# Patient Record
Sex: Female | Born: 1965 | Race: White | Hispanic: No | Marital: Married | Smoking: Never smoker
Health system: Southern US, Community
[De-identification: ages and names within clinical notes are randomized; demographics above are authoritative.]

## PROBLEM LIST (undated history)

## (undated) DIAGNOSIS — I10 Essential (primary) hypertension: Secondary | ICD-10-CM

## (undated) DIAGNOSIS — Z5189 Encounter for other specified aftercare: Secondary | ICD-10-CM

## (undated) DIAGNOSIS — E785 Hyperlipidemia, unspecified: Secondary | ICD-10-CM

## (undated) DIAGNOSIS — Z9889 Other specified postprocedural states: Secondary | ICD-10-CM

## (undated) DIAGNOSIS — Z9861 Coronary angioplasty status: Secondary | ICD-10-CM

## (undated) DIAGNOSIS — I238 Other current complications following acute myocardial infarction: Secondary | ICD-10-CM

## (undated) DIAGNOSIS — K635 Polyp of colon: Secondary | ICD-10-CM

## (undated) DIAGNOSIS — R112 Nausea with vomiting, unspecified: Secondary | ICD-10-CM

## (undated) DIAGNOSIS — I2510 Atherosclerotic heart disease of native coronary artery without angina pectoris: Secondary | ICD-10-CM

## (undated) DIAGNOSIS — R748 Abnormal levels of other serum enzymes: Secondary | ICD-10-CM

## (undated) DIAGNOSIS — K760 Fatty (change of) liver, not elsewhere classified: Secondary | ICD-10-CM

## (undated) DIAGNOSIS — K219 Gastro-esophageal reflux disease without esophagitis: Secondary | ICD-10-CM

## (undated) DIAGNOSIS — I214 Non-ST elevation (NSTEMI) myocardial infarction: Secondary | ICD-10-CM

## (undated) DIAGNOSIS — T7840XA Allergy, unspecified, initial encounter: Secondary | ICD-10-CM

## (undated) DIAGNOSIS — I7090 Unspecified atherosclerosis: Secondary | ICD-10-CM

## (undated) DIAGNOSIS — I319 Disease of pericardium, unspecified: Secondary | ICD-10-CM

## (undated) HISTORY — DX: Other current complications following acute myocardial infarction: I23.8

## (undated) HISTORY — DX: Allergy, unspecified, initial encounter: T78.40XA

## (undated) HISTORY — DX: Encounter for other specified aftercare: Z51.89

## (undated) HISTORY — DX: Abnormal levels of other serum enzymes: R74.8

## (undated) HISTORY — DX: Gastro-esophageal reflux disease without esophagitis: K21.9

## (undated) HISTORY — DX: Polyp of colon: K63.5

## (undated) HISTORY — PX: CARDIAC CATHETERIZATION: SHX172

## (undated) HISTORY — DX: Disease of pericardium, unspecified: I31.9

## (undated) HISTORY — PX: TUBAL LIGATION: SHX77

## (undated) HISTORY — DX: Unspecified atherosclerosis: I70.90

## (undated) HISTORY — DX: Essential (primary) hypertension: I10

## (undated) HISTORY — DX: Fatty (change of) liver, not elsewhere classified: K76.0

---

## 1999-04-14 ENCOUNTER — Inpatient Hospital Stay (HOSPITAL_COMMUNITY): Admission: AD | Admit: 1999-04-14 | Discharge: 1999-04-17 | Payer: Self-pay | Admitting: Obstetrics and Gynecology

## 1999-04-20 ENCOUNTER — Encounter (HOSPITAL_COMMUNITY): Admission: RE | Admit: 1999-04-20 | Discharge: 1999-07-19 | Payer: Self-pay | Admitting: Obstetrics and Gynecology

## 2000-06-26 ENCOUNTER — Encounter: Payer: Self-pay | Admitting: Physical Medicine & Rehabilitation

## 2000-06-26 ENCOUNTER — Ambulatory Visit (HOSPITAL_COMMUNITY)
Admission: RE | Admit: 2000-06-26 | Discharge: 2000-06-26 | Payer: Self-pay | Admitting: Physical Medicine & Rehabilitation

## 2001-06-20 ENCOUNTER — Other Ambulatory Visit: Admission: RE | Admit: 2001-06-20 | Discharge: 2001-06-20 | Payer: Self-pay | Admitting: Obstetrics and Gynecology

## 2002-06-27 ENCOUNTER — Other Ambulatory Visit: Admission: RE | Admit: 2002-06-27 | Discharge: 2002-06-27 | Payer: Self-pay | Admitting: Obstetrics and Gynecology

## 2003-08-05 ENCOUNTER — Other Ambulatory Visit: Admission: RE | Admit: 2003-08-05 | Discharge: 2003-08-05 | Payer: Self-pay | Admitting: Obstetrics and Gynecology

## 2004-08-13 ENCOUNTER — Other Ambulatory Visit: Admission: RE | Admit: 2004-08-13 | Discharge: 2004-08-13 | Payer: Self-pay | Admitting: Obstetrics and Gynecology

## 2004-10-21 ENCOUNTER — Ambulatory Visit: Payer: Self-pay | Admitting: Family Medicine

## 2005-02-04 ENCOUNTER — Encounter: Admission: RE | Admit: 2005-02-04 | Discharge: 2005-02-04 | Payer: Self-pay | Admitting: Obstetrics and Gynecology

## 2005-08-17 ENCOUNTER — Other Ambulatory Visit: Admission: RE | Admit: 2005-08-17 | Discharge: 2005-08-17 | Payer: Self-pay | Admitting: Obstetrics and Gynecology

## 2006-06-13 ENCOUNTER — Ambulatory Visit: Payer: Self-pay | Admitting: Family Medicine

## 2007-01-02 ENCOUNTER — Ambulatory Visit: Payer: Self-pay | Admitting: Family Medicine

## 2007-01-13 ENCOUNTER — Emergency Department (HOSPITAL_COMMUNITY): Admission: EM | Admit: 2007-01-13 | Discharge: 2007-01-13 | Payer: Self-pay | Admitting: Emergency Medicine

## 2009-09-02 ENCOUNTER — Encounter (INDEPENDENT_AMBULATORY_CARE_PROVIDER_SITE_OTHER): Payer: Self-pay | Admitting: *Deleted

## 2009-09-29 ENCOUNTER — Ambulatory Visit: Payer: Self-pay | Admitting: Gastroenterology

## 2009-09-29 DIAGNOSIS — N926 Irregular menstruation, unspecified: Secondary | ICD-10-CM | POA: Insufficient documentation

## 2009-10-21 ENCOUNTER — Ambulatory Visit: Payer: Self-pay | Admitting: Gastroenterology

## 2010-07-04 ENCOUNTER — Emergency Department (HOSPITAL_COMMUNITY): Admission: EM | Admit: 2010-07-04 | Discharge: 2010-07-04 | Payer: Self-pay | Admitting: Emergency Medicine

## 2010-07-04 ENCOUNTER — Emergency Department (HOSPITAL_COMMUNITY): Admission: EM | Admit: 2010-07-04 | Discharge: 2010-07-04 | Payer: Self-pay | Admitting: Family Medicine

## 2010-11-07 ENCOUNTER — Encounter: Payer: Self-pay | Admitting: Obstetrics and Gynecology

## 2010-11-16 NOTE — Letter (Signed)
Summary: [facility] Instructions  [facility]  [address] [place], [place] [address]   Phone: [phone]  Fax: [phone]       GARGI BERCH    06-09-1966    MRN: [id number]        Procedure Day Dorna Bloom: Wednesday, 10/21/09     Arrival Time: 1:00      Procedure Time: 2:00     Location of Procedure:                    _X _  [facility] (4th Floor)                        PREPARATION FOR COLONOSCOPY WITH MOVIPREP   Starting 5 days prior to your procedure 10/16/09 do not eat nuts, seeds, popcorn, corn, beans, peas,  salads, or any raw vegetables.  Do not take any fiber supplements (e.g. Metamucil, Citrucel, and Benefiber).  THE DAY BEFORE YOUR PROCEDURE         DATE:10/20/09  DAY: Tuesday  1.  Drink clear liquids the entire day-NO SOLID FOOD  2.  Do not drink anything colored red or purple.  Avoid juices with pulp.  No orange juice.  3.  Drink at least 64 oz. (8 glasses) of fluid/clear liquids during the day to prevent dehydration and help the prep work efficiently.  CLEAR LIQUIDS INCLUDE: Water Jello Ice Popsicles Tea (sugar ok, no milk/cream) Powdered fruit flavored drinks Coffee (sugar ok, no milk/cream) Gatorade Juice: apple, white grape, white cranberry  Lemonade Clear bullion, consomm, broth Carbonated beverages (any kind) Strained chicken noodle soup Hard Candy                             4.  In the morning, mix first dose of MoviPrep solution:    Empty 1 Pouch A and 1 Pouch B into the disposable container    Add lukewarm drinking water to the top line of the container. Mix to dissolve    Refrigerate (mixed solution should be used within 24 hrs)  5.  Begin drinking the prep at 5:00 p.m. The MoviPrep container is divided by 4 marks.   Every 15 minutes drink the solution down to the next mark (approximately 8 oz) until the full liter is complete.   6.  Follow completed prep with 16 oz of clear liquid of your choice (Nothing red  or purple).  Continue to drink clear liquids until bedtime.  7.  Before going to bed, mix second dose of MoviPrep solution:    Empty 1 Pouch A and 1 Pouch B into the disposable container    Add lukewarm drinking water to the top line of the container. Mix to dissolve    Refrigerate  THE DAY OF YOUR PROCEDURE      DATE: 10/21/09  DAY: Wednesday  Beginning at 9:00 a.m. (5 hours before procedure):         1. Every 15 minutes, drink the solution down to the next mark (approx 8 oz) until the full liter is complete.  2. Follow completed prep with 16 oz. of clear liquid of your choice.    3. You may drink clear liquids until 12:00 (2 HOURS BEFORE PROCEDURE).   MEDICATION INSTRUCTIONS  Unless otherwise instructed, you should take regular prescription medications with a small sip of water   as early as possible the morning of your procedure.  Diabetic patients - see separate instructions.                OTHER INSTRUCTIONS  You will need a responsible adult at least 43 years of age to accompany you and drive you home.   This person must remain in the waiting room during your procedure.  Wear loose fitting clothing that is easily removed.  Leave jewelry and other valuables at home.  However, you may wish to bring a book to read or  an iPod/MP3 player to listen to music as you wait for your procedure to start.  Remove all body piercing jewelry and leave at home.  Total time from sign-in until discharge is approximately 2-3 hours.  You should go home directly after your procedure and rest.  You can resume normal activities the  day after your procedure.  The day of your procedure you should not:   Drive   Make legal decisions   Operate machinery   Drink alcohol   Return to work  You will receive specific instructions about eating, activities and medications before you leave.    The above instructions have been reviewed and explained to me by    _______________________    I fully understand and can verbalize these instructions _____________________________ Date _________

## 2010-11-16 NOTE — Assessment & Plan Note (Signed)
Summary: DAD COLON CA AT AGE 45/SCREEN FOR COLON/YF   History of Present Illness Visit Type: Initial Consult Primary GI MD: Sheryn Bison MD FACP FAGA Primary Provider: Joette Catching, MD Requesting Provider: Richardean Chimera, MD Chief Complaint: Consult colon. Pt's father was diagnosed with colon cancer and passed away last year. History of Present Illness:   45 year old white female referred for screening colonoscopy because of family history of colon cancer in her father at age 27. The patient is asymptomatic and denies rectal bleeding, rectal or abdominal pain, or any change in bowel habits.The patient is a Engineer, civil (consulting) at the [employer].  Historically the patient's father had anorectal cancer. No other known family members with colon cancer for malignancy. The patient is on birth control pills and vitamin D but otherwise has no medical problems. She does not smoke, abuse alcohol or NSAIDs. Her appetite is good and her weight is stable. She has never had screening colonoscopy or barium studies. She is followed by Dr. Arelia Sneddon for her OB/GYN care, and  Dr. Lysbeth Galas for her general medical care.   GI Review of Systems      Denies abdominal pain, acid reflux, belching, bloating, chest pain, dysphagia with liquids, dysphagia with solids, heartburn, loss of appetite, nausea, vomiting, vomiting blood, weight loss, and  weight gain.        Denies anal fissure, black tarry stools, change in bowel habit, constipation, diarrhea, diverticulosis, fecal incontinence, heme positive stool, hemorrhoids, irritable bowel syndrome, jaundice, light color stool, liver problems, rectal bleeding, and  rectal pain.    Current Medications (verified): 1)  Mirena 20 Mcg/24hr Iud (Levonorgestrel) .... As Directed 2)  Vitamin D (Ergocalciferol) 50000 Unit Caps (Ergocalciferol) .... One Tablet By Mouth Once A Week X 6 Weeks  Allergies (verified): 1)  ! Tetracycline  Past History:  Past medical, surgical,  family and social histories (including risk factors) reviewed for relevance to current acute and chronic problems.  Past Medical History: Unremarkable  Past Surgical History: Reviewed history from 09/28/2009 and no changes required. C-section 2000  Family History: Reviewed history from 09/28/2009 and no changes required. Family History of Colon Cancer: Father at age 35 Family History of Heart Disease: Parents  Social History: Reviewed history from 09/28/2009 and no changes required. 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  Alcohol Use - no Illicit Drug Use - no Daily Caffeine Use  Review of Systems  The patient denies allergy/sinus, anemia, anxiety-new, arthritis/joint pain, back pain, blood in urine, breast changes/lumps, change in vision, confusion, cough, coughing up blood, depression-new, fainting, fatigue, fever, headaches-new, hearing problems, heart murmur, heart rhythm changes, itching, menstrual pain, muscle pains/cramps, night sweats, nosebleeds, pregnancy symptoms, shortness of breath, skin rash, sleeping problems, sore throat, swelling of feet/legs, swollen lymph glands, thirst - excessive , urination - excessive , urination changes/pain, urine leakage, vision changes, and voice change.    Vital Signs:  Patient profile:   45 year old female Height:      59 inches Weight:      174 pounds BMI:     35.27 Pulse rate:   80 / minute Pulse rhythm:   regular BP sitting:   128 / 80  (left arm) Cuff size:   regular  Vitals Entered By: Christie Nottingham CMA Duncan Dull) (September 29, 2009 1:57 PM)  Physical Exam  General:  Well developed, well nourished, no acute distress.healthy appearing.   Head:  Normocephalic and atraumatic. Eyes:  PERRLA, no icterus.exam deferred to patient's ophthalmologist.   Neck:  Supple; no masses or thyromegaly. Lungs:  Clear throughout to auscultation. Heart:  Regular rate and rhythm; no murmurs, rubs,  or bruits. Abdomen:  Soft, nontender and  nondistended. No masses, hepatosplenomegaly or hernias noted. Normal bowel sounds. Extremities:  No clubbing, cyanosis, edema or deformities noted. Neurologic:  Alert and  oriented x4;  grossly normal neurologically. Cervical Nodes:  No significant cervical adenopathy. Psych:  Alert and cooperative. Normal mood and affect.   Impression & Recommendations:  Problem # 1:  SPECIAL SCREENING FOR MALIGNANT NEOPLASMS COLON (ICD-V76.51) Assessment Unchanged I had a discussion with the patient and her mother about colon cancer and the need for screening colonoscopy. I agree with Dr. Arelia Sneddon to begin screening at age 43 and every 5 years depending on her colonoscopy results. I've explained her preparation with the balanced electrolyte solution, conscious sedation, and the risk and benefits of colonoscopy in general. She is agreed to proceed at her convenience.  Problem # 2:  IRREGULAR MENSES (ICD-626.4) Assessment: Unchanged She has an IUD in place and is on Mirena birth control management. She will continue regular followup with Dr. Arelia Sneddon as scheduled.  Patient Instructions: 1)  Copy sent to : Dr. Richardean Chimera and Dr. Joette Catching 2)  Please continue current medications.  3)  Colonoscopy and Flexible Sigmoidoscopy brochure given.  4)  Conscious Sedation brochure given.   Appended Document: DAD COLON CA AT AGE 37/SCREEN FOR COLON/YF    Clinical Lists Changes  Medications: Added new medication of MOVIPREP 100 GM  SOLR (PEG-KCL-NACL-NASULF-NA ASC-C) As per prep instructions. - Signed Rx of MOVIPREP 100 GM  SOLR (PEG-KCL-NACL-NASULF-NA ASC-C) As per prep instructions.;  #1 x 0;  Signed;  Entered by: Ashok Cordia RN;  Authorized by: Mardella Layman MD [facility];  Method used: Electronically to [facility]*, [address], [address] Shipping/mailing, [place], [place]  [address], Ph: [phone], Fax: [phone] Orders: Added new Test order of Colonoscopy (Colon) -  Signed    Prescriptions: MOVIPREP 100 GM  SOLR (PEG-KCL-NACL-NASULF-NA ASC-C) As per prep instructions.  #1 x 0   Entered by:   Ashok Cordia RN   Authorized by:   Mardella Layman MD [facility]   Signed by:   Ashok Cordia RN on 09/29/2009   Method used:   Electronically to        [facility]* (retail)       [address]       [address] Shipping/mailing       [place], [place]  [address]       Ph: [phone]       Fax: [phone]   RxID:   [id number]

## 2010-11-16 NOTE — Procedures (Signed)
Summary: Colonoscopy  Patient: [name] Note: All result statuses are Final unless otherwise noted.  Tests: (1) Colonoscopy (COL)   COL Colonoscopy           DONE     [facility]     [address]     [place], [place]  [address]           COLONOSCOPY PROCEDURE REPORT           PATIENT:  Taylor, [name]  MR#:  [id number]     BIRTHDATE:  [birth year], 43 yrs. old  GENDER:  female           ENDOSCOPIST:  Vania Rea. Jarold Motto, MD, [facility]     Referred by:  Nolon Nations, M.D.     Richardean Chimera, M.D.           PROCEDURE DATE:  10/21/2009     PROCEDURE:  Colonoscopy, Diagnostic     ASA CLASS:  Class I     INDICATIONS:  family history of colon cancer           MEDICATIONS:   Fentanyl 100 mcg IV, Versed 8 mg IV           DESCRIPTION OF PROCEDURE:   After the risks benefits and     alternatives of the procedure were thoroughly explained, informed     consent was obtained.  Digital rectal exam was performed and     revealed no abnormalities.   The LB [id number] endoscope     was introduced through the anus and advanced to the cecum, which     was identified by both the appendix and ileocecal valve, without     limitations.  The quality of the prep was good, using MoviPrep.     The instrument was then slowly withdrawn as the colon was fully     examined.     <<PROCEDUREIMAGES>>           FINDINGS:  No polyps or cancers were seen.  This was otherwise a     normal examination of the colon.   Retroflexed views in the rectum     revealed no abnormalities.    The scope was then withdrawn from     the patient and the procedure completed.           COMPLICATIONS:  None           ENDOSCOPIC IMPRESSION:     1) No polyps or cancers     2) Otherwise normal examination     RECOMMENDATIONS:     1) Given your significant family history of colon cancer, you     should have a repeat colonoscopy in 5 years           REPEAT EXAM:  No           ______________________________  Vania Rea. Jarold Motto, MD, [facility] Graham           CC:  Joette Catching, MD           n.     Rosalie Doctor:   Vania Rea. Mayo Faulk at 10/21/2009 02:23 PM           Oletha Cruel, [id number]  Note: An exclamation mark (!) indicates a result that was not dispersed into the flowsheet. Document Creation Date: 10/21/2009 2:23 PM _______________________________________________________________________  (1) Order result status: Final Collection or observation date-time: 10/21/2009 14:16 Requested date-time:  Receipt date-time:  Reported date-time:  Referring Physician:   Ordering  Physician: Sheryn Bison [phone]) Specimen Source:  Source: Launa Grill Order Number: [id number] Lab site:   Appended Document: Colonoscopy    Clinical Lists Changes  Observations: Added new observation of COLONNXTDUE: 10/2014 (10/21/2009 15:10)      Appended Document: Colonoscopy     Procedures Next Due Date:    Colonoscopy: 10/2014

## 2010-11-16 NOTE — Letter (Signed)
Summary: New Patient letter  [facility]  [address] [place], [place] [address]   Phone: [phone]  Fax: [phone]       09/02/2009 MRN: [id number]  [facility] [address] [place], [place]  [address]  Dear Ms. Alona Bene,  Welcome to the Gastroenterology Division at [facility].    You are scheduled to see Dr.  Jarold Motto on 09-29-09 at 2pm on the 3rd floor at [facility], [address]  We ask that you try to arrive at our office 15 minutes prior to your appointment time to allow for check-in.  We would like you to complete the enclosed self-administered evaluation form prior to your visit and bring it with you on the day of your appointment.  We will review it with you.  Also, please bring a complete list of all your medications or, if you prefer, bring the medication bottles and we will list them.  Please bring your insurance card so that we may make a copy of it.  If your insurance requires a referral to see a specialist, please bring your referral form from your primary care physician.  Co-payments are due at the time of your visit and may be paid by cash, check or credit card.     Your office visit will consist of a consult with your physician (includes a physical exam), any laboratory testing he/she may order, scheduling of any necessary diagnostic testing (e.g. x-ray, ultrasound, CT-scan), and scheduling of a procedure (e.g. Endoscopy, Colonoscopy) if required.  Please allow enough time on your schedule to allow for any/all of these possibilities.    If you cannot keep your appointment, please call [phone] to cancel or reschedule prior to your appointment date.  This allows [place] the opportunity to schedule an appointment for another patient in need of care.  If you do not cancel or reschedule by 5 p.m. the business day prior to your appointment date, you will be charged a $50.00 late cancellation/no-show fee.    Thank you for choosing [facility] for your medical needs.  We appreciate the opportunity to care for you.  Please visit [place] at our website  to learn more about our practice.                     Sincerely,                                                             The Gastroenterology Division

## 2010-12-30 LAB — DIFFERENTIAL
Basophils Absolute: 0 10*3/uL (ref 0.0–0.1)
Basophils Relative: 0 % (ref 0–1)
Eosinophils Absolute: 0 10*3/uL (ref 0.0–0.7)
Eosinophils Relative: 0 % (ref 0–5)
Lymphocytes Relative: 13 % (ref 12–46)
Lymphs Abs: 1.7 10*3/uL (ref 0.7–4.0)
Monocytes Absolute: 0.7 10*3/uL (ref 0.1–1.0)
Monocytes Relative: 5 % (ref 3–12)
Neutro Abs: 10.8 10*3/uL — ABNORMAL HIGH (ref 1.7–7.7)
Neutrophils Relative %: 82 % — ABNORMAL HIGH (ref 43–77)

## 2010-12-30 LAB — BASIC METABOLIC PANEL
BUN: 6 mg/dL (ref 6–23)
CO2: 26 mEq/L (ref 19–32)
Calcium: 8.7 mg/dL (ref 8.4–10.5)
Chloride: 105 mEq/L (ref 96–112)
Creatinine, Ser: 0.74 mg/dL (ref 0.4–1.2)
GFR calc Af Amer: >60 mL/min (ref >60)
GFR calc non Af Amer: >60 mL/min (ref >60)
Glucose, Bld: 111 mg/dL — ABNORMAL HIGH (ref 70–99)
Potassium: 2.8 mEq/L — ABNORMAL LOW (ref 3.5–5.1)
Sodium: 138 mEq/L (ref 135–145)

## 2010-12-30 LAB — CBC
HCT: 39.2 % (ref 36.0–46.0)
Hemoglobin: 13.5 g/dL (ref 12.0–15.0)
MCH: 31 pg (ref 26.0–34.0)
MCHC: 34.4 g/dL (ref 30.0–36.0)
MCV: 90.1 fL (ref 78.0–100.0)
Platelets: 294 10*3/uL (ref 150–400)
RBC: 4.35 MIL/uL (ref 3.87–5.11)
RDW: 12.5 % (ref 11.5–15.5)
WBC: 13.3 10*3/uL — ABNORMAL HIGH (ref 4.0–10.5)

## 2011-11-07 ENCOUNTER — Encounter (HOSPITAL_COMMUNITY): Payer: Self-pay

## 2011-11-07 ENCOUNTER — Emergency Department (HOSPITAL_COMMUNITY)
Admission: EM | Admit: 2011-11-07 | Discharge: 2011-11-07 | Disposition: A | Discharge status: Home / Self Care | Payer: 59 | Source: Home / Self Care | Attending: Family Medicine | Admitting: Family Medicine

## 2011-11-07 DIAGNOSIS — H6690 Otitis media, unspecified, unspecified ear: Secondary | ICD-10-CM

## 2011-11-07 DIAGNOSIS — J019 Acute sinusitis, unspecified: Secondary | ICD-10-CM

## 2011-11-07 DIAGNOSIS — H6691 Otitis media, unspecified, right ear: Secondary | ICD-10-CM

## 2011-11-07 MED ORDER — AMOXICILLIN 500 MG PO CAPS: 500.0000 mg | ORAL_CAPSULE | Freq: Three times a day (TID) | ORAL | Status: AC

## 2011-11-07 MED ORDER — IPRATROPIUM BROMIDE 0.06 % NA SOLN: 2.0000 | Freq: Four times a day (QID) | NASAL | Status: DC

## 2011-11-07 MED ORDER — PSEUDOEPHEDRINE-GUAIFENESIN ER 120-1200 MG PO TB12: 120.0000 mg | ORAL_TABLET | Freq: Two times a day (BID) | ORAL | Status: DC

## 2011-11-07 NOTE — ED Notes (Signed)
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

## 2011-11-07 NOTE — ED Provider Notes (Signed)
History     CSN: [id number]  Arrival date & time 11/07/11  1145   First MD Initiated Contact with Patient 11/07/11 1316      Chief Complaint  Patient presents with  . Nasal Congestion    (Consider location/radiation/quality/duration/timing/severity/associated sxs/prior treatment) Patient is a 46 y.o. female presenting with URI. The history is provided by the patient.  URI The primary symptoms include ear pain. Primary symptoms do not include fever, sore throat, swollen glands, cough, wheezing, abdominal pain, nausea, vomiting or rash. The current episode started 2 days ago. This is a new problem. The problem has been gradually worsening.  The onset of the illness is associated with exposure to sick contacts. Symptoms associated with the illness include plugged ear sensation, facial pain, sinus pressure, congestion and rhinorrhea.    History reviewed. No pertinent past medical history.  Past Surgical History  Procedure Date  . Cesarean section     History reviewed. No pertinent family history.  History  Substance Use Topics  . Smoking status: Never Smoker   . Smokeless tobacco: Not on file  . Alcohol Use: Yes    OB History    Grav Para Term Preterm Abortions TAB SAB Ect Mult Living                  Review of Systems  Constitutional: Negative for fever.  HENT: Positive for ear pain, congestion, rhinorrhea, postnasal drip and sinus pressure. Negative for sore throat.   Respiratory: Negative for cough and wheezing.   Gastrointestinal: Negative for nausea, vomiting and abdominal pain.  Skin: Negative for rash.    Allergies  Tetracycline  Home Medications   Current Outpatient Rx  Name Route Sig Dispense Refill  . AMOXICILLIN 500 MG PO CAPS Oral Take 1 capsule (500 mg total) by mouth 3 (three) times daily. 30 capsule 0  . IPRATROPIUM BROMIDE 0.06 % NA SOLN Nasal Place 2 sprays into the nose 4 (four) times daily. 15 mL 12  . PSEUDOEPHEDRINE-GUAIFENESIN ER [phone]  MG PO TB12 Oral Take 120-1,200 mg by mouth 2 (two) times daily. 30 each 0    BP 144/90  Pulse 98  Temp(Src) 98.4 F (36.9 C) (Oral)  Resp 20  SpO2 100%  Physical Exam  Nursing note and vitals reviewed. Constitutional: She is oriented to person, place, and time. She appears well-developed and well-nourished.  HENT:  Head: Normocephalic.  Right Ear: Tympanic membrane is injected, erythematous and retracted. Tympanic membrane mobility is abnormal.  Left Ear: External ear and ear canal normal.  Nose: Mucosal edema and rhinorrhea present.  Mouth/Throat: Oropharynx is clear and moist.  Cardiovascular: Normal rate, normal heart sounds and intact distal pulses.   Pulmonary/Chest: Effort normal and breath sounds normal.  Neurological: She is alert and oriented to person, place, and time.  Skin: Skin is warm and dry.    ED Course  Procedures (including critical care time)  Labs Reviewed - No data to display No results found.   1. Sinusitis acute   2. Otitis media of right ear       MDM          Barkley Bruns, MD 11/07/11 1402

## 2012-04-16 ENCOUNTER — Ambulatory Visit (INDEPENDENT_AMBULATORY_CARE_PROVIDER_SITE_OTHER): Payer: 59 | Admitting: Internal Medicine

## 2012-04-16 ENCOUNTER — Encounter: Payer: Self-pay | Admitting: Internal Medicine

## 2012-04-16 VITALS — BP 112/76 | HR 70 | Ht 59.0 in | Wt 178.0 lb

## 2012-04-16 DIAGNOSIS — R0602 Shortness of breath: Secondary | ICD-10-CM

## 2012-04-16 DIAGNOSIS — R079 Chest pain, unspecified: Secondary | ICD-10-CM

## 2012-04-16 LAB — CBC WITH DIFFERENTIAL/PLATELET
Basophils Absolute: 0 10*3/uL (ref 0.0–0.1)
Basophils Relative: 0.3 % (ref 0.0–3.0)
Eosinophils Absolute: 0.3 10*3/uL (ref 0.0–0.7)
Eosinophils Relative: 2.8 % (ref 0.0–5.0)
HCT: 41 % (ref 36.0–46.0)
Hemoglobin: 13.9 g/dL (ref 12.0–15.0)
Lymphocytes Relative: 32.3 % (ref 12.0–46.0)
Lymphs Abs: 3.5 10*3/uL (ref 0.7–4.0)
MCHC: 33.8 g/dL (ref 30.0–36.0)
MCV: 92.4 fl (ref 78.0–100.0)
Monocytes Absolute: 0.6 10*3/uL (ref 0.1–1.0)
Monocytes Relative: 5.8 % (ref 3.0–12.0)
Neutro Abs: 6.4 10*3/uL (ref 1.4–7.7)
Neutrophils Relative %: 58.8 % (ref 43.0–77.0)
Platelets: 322 10*3/uL (ref 150.0–400.0)
RBC: 4.44 Mil/uL (ref 3.87–5.11)
RDW: 13.3 % (ref 11.5–14.6)
WBC: 10.9 10*3/uL — ABNORMAL HIGH (ref 4.5–10.5)

## 2012-04-16 LAB — BASIC METABOLIC PANEL
BUN: 12 mg/dL (ref 6–23)
CO2: 27 mEq/L (ref 19–32)
Calcium: 9.2 mg/dL (ref 8.4–10.5)
Chloride: 105 mEq/L (ref 96–112)
Creatinine, Ser: 0.7 mg/dL (ref 0.4–1.2)
GFR: 90.78 mL/min (ref >60.00)
Glucose, Bld: 92 mg/dL (ref 70–99)
Potassium: 4.2 mEq/L (ref 3.5–5.1)
Sodium: 138 mEq/L (ref 135–145)

## 2012-04-16 LAB — LIPID PANEL
Cholesterol: 169 mg/dL (ref 0–200)
HDL: 39.3 mg/dL (ref >39.00)
LDL Cholesterol: 116 mg/dL — ABNORMAL HIGH (ref 0–99)
Total CHOL/HDL Ratio: 4
Triglycerides: 71 mg/dL (ref 0.0–149.0)
VLDL: 14.2 mg/dL (ref 0.0–40.0)

## 2012-04-16 LAB — TSH: TSH: 1.76 u[IU]/mL (ref 0.35–5.50)

## 2012-04-16 NOTE — Progress Notes (Signed)
HPI Patient is a 46 year old who is referred fro evaluation of SOB.   Patient notes she has gained wt.   Over past 1 year when she walked in from work she is very SOB   QUestion if lungs or heart. With walking on treadmill she does get very SOB and chest pain. Sometimes with stress at work feels heart beating fast.  No Syncope. Denies wheezing.   Allergies  Allergen Reactions  . Tetracycline     Current Outpatient Prescriptions  Medication Sig Dispense Refill  . ipratropium (ATROVENT) 0.06 % nasal spray Place 2 sprays into the nose 4 (four) times daily as needed.      Marland Kitchen DISCONTD: ipratropium (ATROVENT) 0.06 % nasal spray Place 2 sprays into the nose 4 (four) times daily.  15 mL  12    No past medical history on file.  Past Surgical History  Procedure Date  . Cesarean section     No family history on file.  History   Social History  . Marital Status: Married    Spouse Name: N/A    Number of Children: N/A  . Years of Education: N/A   Occupational History  . Not on file.   Social History Main Topics  . Smoking status: Never Smoker   . Smokeless tobacco: Not on file  . Alcohol Use: Yes  . Drug Use: No  . Sexually Active:    Other Topics Concern  . Not on file   Social History Narrative  . No narrative on file    Review of Systems:  All systems reviewed.  They are negative to the above problem except as previously stated.  Vital Signs: BP 112/76  Pulse 70  Ht 4\' 11"  (1.499 m)  Wt 178 lb (80.74 kg)  BMI 35.95 kg/m2  Physical Exam  HEENT:  Normocephalic, atraumatic. EOMI, PERRLA.  Neck: JVP is normal. No thyromegaly. No bruits.  Lungs: clear to auscultation. No rales no wheezes.  Heart: Regular rate and rhythm. Normal S1, S2. No S3.   No significant murmurs. PMI not displaced.  Abdomen:  Supple, nontender. Normal bowel sounds. No masses. No hepatomegaly.  Extremities:   Good distal pulses throughout. No lower extremity edema.  Musculoskeletal :moving all  extremities.  Neuro:   alert and oriented x3.  CN II-XII grossly intact.  EKG:  SR  70   Assessment and Plan:  1.  SOB  I am not sure what this represents.  I would recomm an echo to evaluate systolic and diastolic function.    I would also set up a stress echo to evaluate exercise capacity as well as evaulate for wall motion changes. Take activiies as tolerated for now.  Check TSH, CBC, lipid and K. 2.  HCM  As above.

## 2012-04-16 NOTE — Patient Instructions (Signed)
Lab work today. Will call you with results.  Your physician has requested that you have an echocardiogram. Echocardiography is a painless test that uses sound waves to create images of your heart. It provides your doctor with information about the size and shape of your heart and how well your heart's chambers and valves are working. This procedure takes approximately one hour. There are no restrictions for this procedure.  Your physician has requested that you have a stress echocardiogram. For further information please visit [email or web address] Please follow instruction sheet as given.

## 2012-04-18 ENCOUNTER — Ambulatory Visit (HOSPITAL_COMMUNITY): Payer: 59 | Attending: Cardiology

## 2012-04-18 DIAGNOSIS — R0989 Other specified symptoms and signs involving the circulatory and respiratory systems: Secondary | ICD-10-CM | POA: Insufficient documentation

## 2012-04-18 DIAGNOSIS — R0609 Other forms of dyspnea: Secondary | ICD-10-CM | POA: Insufficient documentation

## 2012-04-18 DIAGNOSIS — R0602 Shortness of breath: Secondary | ICD-10-CM

## 2012-04-18 DIAGNOSIS — R072 Precordial pain: Secondary | ICD-10-CM | POA: Insufficient documentation

## 2012-04-18 DIAGNOSIS — R079 Chest pain, unspecified: Secondary | ICD-10-CM

## 2012-04-18 NOTE — Progress Notes (Signed)
Echocardiogram performed.  

## 2012-04-25 ENCOUNTER — Ambulatory Visit (HOSPITAL_COMMUNITY): Payer: 59 | Attending: Internal Medicine

## 2012-04-25 ENCOUNTER — Ambulatory Visit (HOSPITAL_COMMUNITY): Payer: 59 | Attending: Cardiovascular Disease

## 2012-04-25 ENCOUNTER — Encounter: Payer: Self-pay | Admitting: Internal Medicine

## 2012-04-25 DIAGNOSIS — R0609 Other forms of dyspnea: Secondary | ICD-10-CM | POA: Insufficient documentation

## 2012-04-25 DIAGNOSIS — R0602 Shortness of breath: Secondary | ICD-10-CM

## 2012-04-25 DIAGNOSIS — R5383 Other fatigue: Secondary | ICD-10-CM | POA: Insufficient documentation

## 2012-04-25 DIAGNOSIS — R5381 Other malaise: Secondary | ICD-10-CM | POA: Insufficient documentation

## 2012-04-25 DIAGNOSIS — R000 Tachycardia, unspecified: Secondary | ICD-10-CM | POA: Insufficient documentation

## 2012-04-25 DIAGNOSIS — R072 Precordial pain: Secondary | ICD-10-CM | POA: Insufficient documentation

## 2012-04-25 DIAGNOSIS — Z8249 Family history of ischemic heart disease and other diseases of the circulatory system: Secondary | ICD-10-CM | POA: Insufficient documentation

## 2012-04-25 DIAGNOSIS — R079 Chest pain, unspecified: Secondary | ICD-10-CM | POA: Insufficient documentation

## 2012-04-25 DIAGNOSIS — R0989 Other specified symptoms and signs involving the circulatory and respiratory systems: Secondary | ICD-10-CM | POA: Insufficient documentation

## 2012-04-25 DIAGNOSIS — R002 Palpitations: Secondary | ICD-10-CM | POA: Insufficient documentation

## 2012-04-25 NOTE — Progress Notes (Signed)
Echocardiogram performed.  

## 2012-04-30 ENCOUNTER — Telehealth: Payer: Self-pay | Admitting: Internal Medicine

## 2012-04-30 DIAGNOSIS — R0602 Shortness of breath: Secondary | ICD-10-CM

## 2012-04-30 NOTE — Telephone Encounter (Signed)
Called patient with echo,stress echo, and lab results. Will set up for PFT's. Order sent to [facility].

## 2012-04-30 NOTE — Progress Notes (Signed)
Agree with plans

## 2012-04-30 NOTE — Telephone Encounter (Signed)
New problem:  Patient calling - test results  

## 2012-05-09 ENCOUNTER — Ambulatory Visit (INDEPENDENT_AMBULATORY_CARE_PROVIDER_SITE_OTHER): Payer: 59 | Admitting: Internal Medicine

## 2012-05-09 DIAGNOSIS — J449 Chronic obstructive pulmonary disease, unspecified: Secondary | ICD-10-CM

## 2012-05-09 DIAGNOSIS — R0602 Shortness of breath: Secondary | ICD-10-CM

## 2012-05-09 DIAGNOSIS — R911 Solitary pulmonary nodule: Secondary | ICD-10-CM

## 2012-05-09 LAB — PULMONARY FUNCTION TEST

## 2012-05-11 ENCOUNTER — Other Ambulatory Visit: Payer: 59

## 2012-05-16 ENCOUNTER — Telehealth: Payer: Self-pay | Admitting: Internal Medicine

## 2012-05-16 NOTE — Telephone Encounter (Signed)
pt calling for results of pft test [phone]

## 2012-05-16 NOTE — Telephone Encounter (Signed)
Results not in system, [place] working on getting those.  Advised patient and will forward to Stormy Card RN for follow up.

## 2012-05-17 NOTE — Telephone Encounter (Signed)
Result routed to Dr.Ross for review.

## 2012-05-18 ENCOUNTER — Telehealth: Payer: Self-pay | Admitting: *Deleted

## 2012-05-18 NOTE — Telephone Encounter (Signed)
LMOM for call back for PFT results.

## 2012-05-18 NOTE — Telephone Encounter (Signed)
Message     PFTs are near normal   Stress echo was normal No evidence of ischemia.   I think a lot of her symptoms are due to being deconditioned, overweight   I am not convinced of coronary ischemia   I would like her to try to increase her acitviyt, try to lose wt.    I would not schedule further testing for now.   F/U with call in 6 wks with wts, response.      ----- Message -----   From: Gerome Apley, RN BSN   Sent: 05/17/2012 3:07 PM   To: Pricilla Riffle, MD

## 2012-05-24 NOTE — Telephone Encounter (Signed)
Left message on private voice mail with detailed information concerning PFT results.

## 2012-05-24 NOTE — Telephone Encounter (Signed)
PFTs are normal.  No lung dz to explain SOB. Stress echo normal.  No evidence of ischemia.   I would recomm that she increase her activity.  Symptoms may improve with continued training Does need to lose wt.  This may also help. I would not pursure furhter testing now.

## 2012-05-24 NOTE — Telephone Encounter (Signed)
Called patient and LM with results of PFT's.

## 2012-12-01 ENCOUNTER — Other Ambulatory Visit: Payer: Self-pay

## 2013-01-10 NOTE — Progress Notes (Signed)
PFT done on 05-09-12 by Debbora Dus.

## 2013-03-11 ENCOUNTER — Emergency Department (INDEPENDENT_AMBULATORY_CARE_PROVIDER_SITE_OTHER): Payer: 59

## 2013-03-11 ENCOUNTER — Emergency Department (HOSPITAL_COMMUNITY)
Admission: EM | Admit: 2013-03-11 | Discharge: 2013-03-11 | Disposition: A | Discharge status: Home / Self Care | Payer: 59 | Source: Home / Self Care | Attending: Emergency Medicine | Admitting: Emergency Medicine

## 2013-03-11 ENCOUNTER — Encounter (HOSPITAL_COMMUNITY): Payer: Self-pay | Admitting: *Deleted

## 2013-03-11 DIAGNOSIS — J209 Acute bronchitis, unspecified: Secondary | ICD-10-CM

## 2013-03-11 MED ORDER — PREDNISONE 20 MG PO TABS: 20.0000 mg | ORAL_TABLET | Freq: Two times a day (BID) | ORAL | Status: DC

## 2013-03-11 MED ORDER — IBUPROFEN 800 MG PO TABS
ORAL_TABLET | ORAL | Status: AC
  Filled 2013-03-11: qty 1

## 2013-03-11 MED ORDER — ALBUTEROL SULFATE HFA 108 (90 BASE) MCG/ACT IN AERS: 2.0000 | INHALATION_SPRAY | Freq: Four times a day (QID) | RESPIRATORY_TRACT | Status: DC

## 2013-03-11 MED ORDER — IBUPROFEN 800 MG PO TABS
800.0000 mg | ORAL_TABLET | Freq: Once | ORAL | Status: AC
  Administered 2013-03-11: 800 mg via ORAL

## 2013-03-11 MED ORDER — BENZONATATE 200 MG PO CAPS: 200.0000 mg | ORAL_CAPSULE | Freq: Three times a day (TID) | ORAL | Status: DC | PRN

## 2013-03-11 MED ORDER — FEXOFENADINE-PSEUDOEPHED ER 60-120 MG PO TB12: 1.0000 | ORAL_TABLET | Freq: Two times a day (BID) | ORAL | Status: DC

## 2013-03-11 NOTE — ED Notes (Signed)
Pt  Reports  persistant      Symptoms       Of       Cough  Congestion         sorethroat  And   Nasal  Congestion  With  The   Symptoms  Not  releived  By otc  meds            -  She  Is  Sitting  Upright on  Exam table  Speaking in  Complete  sentances

## 2013-03-11 NOTE — ED Provider Notes (Signed)
Chief Complaint:   Chief Complaint  Patient presents with  . URI    History of Present Illness:   [name] is a 47 year old female who has had a five-day history of cough productive yellow sputum, shortness of breath, nasal congestion with clear to yellow drainage, headache, nasal burning, ear congestion, chills, and sore throat. She denies any fever, wheezing, chest pain, or GI symptoms. She has no history of allergies or asthma. No sick exposures. She rates her pain as 7-8/10 in intensity.  Review of Systems:  Other than noted above, the patient denies any of the following symptoms: Systemic:  No fevers, chills, sweats, weight loss or gain, fatigue, or tiredness. Eye:  No redness or discharge. ENT:  No ear pain, drainage, headache, nasal congestion, drainage, sinus pressure, difficulty swallowing, or sore throat. Neck:  No neck pain or swollen glands. Lungs:  No cough, sputum production, hemoptysis, wheezing, chest tightness, shortness of breath or chest pain. GI:  No abdominal pain, nausea, vomiting or diarrhea.  PMFSH:  Past medical history, family history, social history, meds, and allergies were reviewed.  Physical Exam:   Vital signs:  BP 142/93  Pulse 94  Temp(Src) 98.7 F (37.1 C) (Oral)  Resp 20  SpO2 100% General:  Alert and oriented.  In no distress.  Skin warm and dry. Eye:  No conjunctival injection or drainage. Lids were normal. ENT:  TMs and canals were normal, without erythema or inflammation.  Nasal mucosa was clear and uncongested, without drainage.  Mucous membranes were moist.  Pharynx was clear with no exudate or drainage.  There were no oral ulcerations or lesions. Neck:  Supple, no adenopathy, tenderness or mass. Lungs:  No respiratory distress.  Lungs were clear to auscultation, without wheezes, rales or rhonchi.  Breath sounds were clear and equal bilaterally.  Heart:  Regular rhythm, without gallops, murmers or rubs. Skin:  Clear, warm, and dry, without  rash or lesions.  Radiology:  Dg Chest 2 View  03/11/2013   *RADIOLOGY REPORT*  Clinical Data: Productive cough.  CHEST - 2 VIEW  Comparison: 07/04/2010.  Findings: The cardiac silhouette, mediastinal and hilar contours are normal and stable.  The lungs are clear.  No pleural effusion. The bony thorax is intact.  IMPRESSION: No acute cardiopulmonary findings.   Original Report Authenticated By: Rudie Meyer, M.D.   Course in Urgent Care Center:   Given Motrin 800 mg by mouth for pain.  Assessment:  The encounter diagnosis was Acute bronchitis.  No indication for antibiotics at this time. We'll treat for acute bronchitis with albuterol, Tessalon, Allegra-D, and prednisone.  Plan:   1.  The following meds were prescribed:   Discharge Medication List as of 03/11/2013 11:48 AM    START taking these medications   Details  albuterol (PROVENTIL HFA;VENTOLIN HFA) 108 (90 BASE) MCG/ACT inhaler Inhale 2 puffs into the lungs 4 (four) times daily., Starting 03/11/2013, Until Discontinued, Normal    benzonatate (TESSALON) 200 MG capsule Take 1 capsule (200 mg total) by mouth 3 (three) times daily as needed for cough., Starting 03/11/2013, Until Discontinued, Normal    fexofenadine-pseudoephedrine (ALLEGRA-D) 60-120 MG per tablet Take 1 tablet by mouth every 12 (twelve) hours., Starting 03/11/2013, Until Discontinued, Normal    predniSONE (DELTASONE) 20 MG tablet Take 1 tablet (20 mg total) by mouth 2 (two) times daily., Starting 03/11/2013, Until Discontinued, Normal       2.  The patient was instructed in symptomatic care and handouts were given. 3.  The patient was told to return if becoming worse in any way, if no better in 3 or 4 days, and given some red flag symptoms such as fever or difficulty breathing that would indicate earlier return. 4.  Follow up here if no better in 3 or 4 days.      Reuben Likes, MD 03/11/13 2017

## 2013-05-02 ENCOUNTER — Encounter (HOSPITAL_COMMUNITY): Payer: Self-pay | Admitting: Pharmacy Technician

## 2013-05-09 NOTE — H&P (Signed)
[name], [name] NO.:  [id number]  MEDICAL RECORD NO.:  [id number]  LOCATION:                                 FACILITY:  PHYSICIAN:  Juluis Mire, M.D.   DATE OF BIRTH:  09-03-1966  DATE OF ADMISSION:  05/16/2013 DATE OF DISCHARGE:                             HISTORY & PHYSICAL   DATE OF SURGERY:  May 16, 2013, at [facility].  HISTORY OF PRESENT ILLNESS:  The patient is a 47 year old, gravida 2, para 1, abortus 1 female, presents for hysteroscopy for removal of retained IUD.  The patient is due for changing of the IUD.  We are unable to complete this in the office even using a paracervical block and ultrasound guidance.  The patient was having too much discomfort; therefore, she comes in today for undergoing hysteroscopic evaluation and removal of the IUD.  ALLERGIES:  In terms of allergy, she is allergic to TETRACYCLINE.  MEDICATIONS:  She has been on prednisone.  PAST MEDICAL HISTORY:  Usual childhood diseases.  No significant sequelae.  PAST SURGICAL HISTORY:  She has had a previous D and E and a previous cesarean section.  SOCIAL HISTORY:  Reveals occasional alcohol.  No tobacco use.  FAMILY HISTORY:  Significant for history of colon cancer, and heart disease.  REVIEW OF SYSTEMS:  Noncontributory.  PHYSICAL EXAMINATION:  VITAL SIGNS:  The patient is afebrile with stable vital signs. HEENT:  The patient is normocephalic.  Pupils are equal, round, and reactive to light and accommodation.  Extraocular movements are intact. Sclerae and conjunctivae are clear.  Oropharynx is clear. NECK:  Without thyromegaly. BREASTS:  Not examined. LUNGS:  Clear. CARDIOVASCULAR:  Regular rate without murmurs or gallops. ABDOMEN:  Benign.  No mass, organomegaly, or tenderness. PELVIC:  Normal external genitalia.  Vaginal mucosa is clear.  Cervix unremarkable.  Uterus normal size, shape, and contour.  Adnexa free of mass or tenderness. EXTREMITIES:   Trace edema. NEUROLOGIC:  Grossly within normal limits.  IMPRESSION:  Retained IUD.  PLAN:  The patient to undergo hysteroscopy with removal of the IUD. Risks of surgery have been discussed including the risk of infection. The risk of hemorrhage that could require transfusion with the risk of AIDS or hepatitis.  Excessive bleeding could require hysterectomy. There is a risk of perforation of injury to adjacent organs such as bowel that could require further exploratory surgery.  Risk of deep venous thrombosis and pulmonary embolus.  The patient expressed understanding of indications and risks.     Juluis Mire, M.D.     JSM/MEDQ  D:  05/09/2013  T:  05/09/2013  Job:  [id number]

## 2013-05-09 NOTE — H&P (Signed)
  Patient name Marliss, Buttacavoli DICTATION#  [id number] CSN# [id number]  [facility], MD 05/09/2013 5:55 AM

## 2013-05-14 NOTE — H&P (Signed)
  Patient also desires permanent sterilization. Will precede with laparoscopic bilateral tubal ligation.  Risks discussed.  Infection.  Injury to adjacent organs including bowel and bladder that could require further surgery. Risk of hemorrhage leading to transfusions with risk of AIDS and hepatitis. Risk of DVT and PE.  Potential irreversibility discussed. Failure rate of 1 in 200 explained.  Failures can be in the form of an ectopic pregnancy requiring surgical removal.  Alternatives for birth control discussed.

## 2013-05-16 ENCOUNTER — Ambulatory Visit (HOSPITAL_COMMUNITY): Payer: 59 | Admitting: Anesthesiology

## 2013-05-16 ENCOUNTER — Encounter (HOSPITAL_COMMUNITY): Payer: Self-pay | Admitting: Anesthesiology

## 2013-05-16 ENCOUNTER — Encounter (HOSPITAL_COMMUNITY)
Admission: RE | Disposition: A | Discharge status: Home / Self Care | Payer: Self-pay | Source: Ambulatory Visit | Attending: Obstetrics and Gynecology

## 2013-05-16 ENCOUNTER — Ambulatory Visit (HOSPITAL_COMMUNITY)
Admission: RE | Admit: 2013-05-16 | Discharge: 2013-05-16 | Disposition: A | Discharge status: Home / Self Care | Payer: 59 | Source: Ambulatory Visit | Attending: Obstetrics and Gynecology | Admitting: Obstetrics and Gynecology

## 2013-05-16 DIAGNOSIS — Z30432 Encounter for removal of intrauterine contraceptive device: Secondary | ICD-10-CM

## 2013-05-16 DIAGNOSIS — Z9851 Tubal ligation status: Secondary | ICD-10-CM

## 2013-05-16 DIAGNOSIS — Z302 Encounter for sterilization: Secondary | ICD-10-CM | POA: Insufficient documentation

## 2013-05-16 HISTORY — PX: IUD REMOVAL: SHX5392

## 2013-05-16 HISTORY — PX: LAPAROSCOPIC TUBAL LIGATION: SHX1937

## 2013-05-16 LAB — CBC
HCT: 39.7 % (ref 36.0–46.0)
Hemoglobin: 13.6 g/dL (ref 12.0–15.0)
MCH: 30.8 pg (ref 26.0–34.0)
MCHC: 34.3 g/dL (ref 30.0–36.0)
MCV: 90 fL (ref 78.0–100.0)
Platelets: 346 10*3/uL (ref 150–400)
RBC: 4.41 MIL/uL (ref 3.87–5.11)
RDW: 13.2 % (ref 11.5–15.5)
WBC: 9 10*3/uL (ref 4.0–10.5)

## 2013-05-16 LAB — HCG, SERUM, QUALITATIVE: Preg, Serum: NEGATIVE

## 2013-05-16 SURGERY — LIGATION, FALLOPIAN TUBE, LAPAROSCOPIC
Anesthesia: General | Site: Vagina | Wound class: Clean Contaminated

## 2013-05-16 MED ORDER — DEXAMETHASONE SODIUM PHOSPHATE 10 MG/ML IJ SOLN
INTRAMUSCULAR | Status: AC
  Filled 2013-05-16: qty 1

## 2013-05-16 MED ORDER — BUPIVACAINE HCL (PF) 0.25 % IJ SOLN
INTRAMUSCULAR | Status: DC | PRN
  Administered 2013-05-16: 3 mL

## 2013-05-16 MED ORDER — LIDOCAINE HCL (CARDIAC) 20 MG/ML IV SOLN
INTRAVENOUS | Status: DC | PRN
  Administered 2013-05-16: 80 mg via INTRAVENOUS

## 2013-05-16 MED ORDER — NEOSTIGMINE METHYLSULFATE 1 MG/ML IJ SOLN
INTRAMUSCULAR | Status: DC | PRN
  Administered 2013-05-16: 4 mg via INTRAVENOUS

## 2013-05-16 MED ORDER — MEPERIDINE HCL 25 MG/ML IJ SOLN: 6.2500 mg | INTRAMUSCULAR | Status: DC | PRN

## 2013-05-16 MED ORDER — FENTANYL CITRATE 0.05 MG/ML IJ SOLN
INTRAMUSCULAR | Status: AC
  Filled 2013-05-16: qty 2

## 2013-05-16 MED ORDER — FENTANYL CITRATE 0.05 MG/ML IJ SOLN
INTRAMUSCULAR | Status: AC
  Filled 2013-05-16: qty 5

## 2013-05-16 MED ORDER — DEXAMETHASONE SODIUM PHOSPHATE 10 MG/ML IJ SOLN
INTRAMUSCULAR | Status: DC | PRN
  Administered 2013-05-16: 10 mg via INTRAVENOUS

## 2013-05-16 MED ORDER — OXYCODONE-ACETAMINOPHEN 5-325 MG PO TABS
1.0000 | ORAL_TABLET | Freq: Once | ORAL | Status: AC
  Administered 2013-05-16: 1 via ORAL

## 2013-05-16 MED ORDER — PROPOFOL 10 MG/ML IV EMUL
INTRAVENOUS | Status: AC
  Filled 2013-05-16: qty 20

## 2013-05-16 MED ORDER — OXYCODONE-ACETAMINOPHEN 5-325 MG PO TABS
ORAL_TABLET | ORAL | Status: AC
  Filled 2013-05-16: qty 1

## 2013-05-16 MED ORDER — KETOROLAC TROMETHAMINE 30 MG/ML IJ SOLN
INTRAMUSCULAR | Status: AC
  Filled 2013-05-16: qty 1

## 2013-05-16 MED ORDER — MIDAZOLAM HCL 2 MG/2ML IJ SOLN
INTRAMUSCULAR | Status: AC
  Filled 2013-05-16: qty 2

## 2013-05-16 MED ORDER — FENTANYL CITRATE 0.05 MG/ML IJ SOLN
25.0000 ug | INTRAMUSCULAR | Status: DC | PRN
  Administered 2013-05-16: 25 ug via INTRAVENOUS

## 2013-05-16 MED ORDER — LIDOCAINE HCL (CARDIAC) 20 MG/ML IV SOLN
INTRAVENOUS | Status: AC
  Filled 2013-05-16: qty 5

## 2013-05-16 MED ORDER — FENTANYL CITRATE 0.05 MG/ML IJ SOLN
INTRAMUSCULAR | Status: DC | PRN
  Administered 2013-05-16: 100 ug via INTRAVENOUS
  Administered 2013-05-16 (×2): 50 ug via INTRAVENOUS

## 2013-05-16 MED ORDER — CEFAZOLIN SODIUM-DEXTROSE 2-3 GM-% IV SOLR
2.0000 g | INTRAVENOUS | Status: AC
  Administered 2013-05-16: 2 g via INTRAVENOUS

## 2013-05-16 MED ORDER — LACTATED RINGERS IV SOLN
INTRAVENOUS | Status: DC
  Administered 2013-05-16 (×2): via INTRAVENOUS

## 2013-05-16 MED ORDER — GLYCOPYRROLATE 0.2 MG/ML IJ SOLN
INTRAMUSCULAR | Status: DC | PRN
  Administered 2013-05-16: 0.6 mg via INTRAVENOUS

## 2013-05-16 MED ORDER — MIDAZOLAM HCL 5 MG/5ML IJ SOLN
INTRAMUSCULAR | Status: DC | PRN
  Administered 2013-05-16: 2 mg via INTRAVENOUS

## 2013-05-16 MED ORDER — METOCLOPRAMIDE HCL 5 MG/ML IJ SOLN: 10.0000 mg | Freq: Once | INTRAMUSCULAR | Status: DC | PRN

## 2013-05-16 MED ORDER — ONDANSETRON HCL 4 MG/2ML IJ SOLN
INTRAMUSCULAR | Status: DC | PRN
  Administered 2013-05-16: 4 mg via INTRAVENOUS

## 2013-05-16 MED ORDER — KETOROLAC TROMETHAMINE 30 MG/ML IJ SOLN
30.0000 mg | Freq: Once | INTRAMUSCULAR | Status: AC
  Administered 2013-05-16: 30 mg via INTRAVENOUS

## 2013-05-16 MED ORDER — CEFAZOLIN SODIUM-DEXTROSE 2-3 GM-% IV SOLR
INTRAVENOUS | Status: AC
  Filled 2013-05-16: qty 50

## 2013-05-16 MED ORDER — PROPOFOL 10 MG/ML IV BOLUS
INTRAVENOUS | Status: DC | PRN
  Administered 2013-05-16: 50 mg via INTRAVENOUS
  Administered 2013-05-16: 150 mg via INTRAVENOUS

## 2013-05-16 MED ORDER — CEFAZOLIN SODIUM-DEXTROSE 2-3 GM-% IV SOLR: 2.0000 g | INTRAVENOUS | Status: DC

## 2013-05-16 MED ORDER — BUPIVACAINE HCL (PF) 0.25 % IJ SOLN
INTRAMUSCULAR | Status: AC
  Filled 2013-05-16: qty 30

## 2013-05-16 MED ORDER — ONDANSETRON HCL 4 MG/2ML IJ SOLN
INTRAMUSCULAR | Status: AC
  Filled 2013-05-16: qty 2

## 2013-05-16 MED ORDER — ROCURONIUM BROMIDE 100 MG/10ML IV SOLN
INTRAVENOUS | Status: DC | PRN
  Administered 2013-05-16: 5 mg via INTRAVENOUS
  Administered 2013-05-16: 20 mg via INTRAVENOUS

## 2013-05-16 SURGICAL SUPPLY — 24 items
CANISTER SUCTION 2500CC (MISCELLANEOUS) ×4 IMPLANT
CATH ROBINSON RED A/P 16FR (CATHETERS) ×4 IMPLANT
CLOTH BEACON ORANGE TIMEOUT ST (SAFETY) ×4 IMPLANT
CONTAINER PREFILL 10% NBF 60ML (FORM) ×8 IMPLANT
DRESSING TELFA 8X3 (GAUZE/BANDAGES/DRESSINGS) ×4 IMPLANT
DRSG COVADERM PLUS 2X2 (GAUZE/BANDAGES/DRESSINGS) ×4 IMPLANT
ELECT REM PT RETURN 9FT ADLT (ELECTROSURGICAL)
ELECTRODE REM PT RTRN 9FT ADLT (ELECTROSURGICAL) IMPLANT
GLOVE BIO SURGEON STRL SZ7 (GLOVE) ×4 IMPLANT
GOWN STRL REIN XL XLG (GOWN DISPOSABLE) ×8 IMPLANT
LOOP ANGLED CUTTING 22FR (CUTTING LOOP) IMPLANT
NEEDLE INSUFFLATION 120MM (ENDOMECHANICALS) ×4 IMPLANT
NEEDLE SPNL 20GX3.5 QUINCKE YW (NEEDLE) ×4 IMPLANT
PACK HYSTEROSCOPY LF (CUSTOM PROCEDURE TRAY) ×8 IMPLANT
PACK LAPAROSCOPY BASIN (CUSTOM PROCEDURE TRAY) ×4 IMPLANT
PAD OB MATERNITY 4.3X12.25 (PERSONAL CARE ITEMS) ×4 IMPLANT
STRIP CLOSURE SKIN 1/4X3 (GAUZE/BANDAGES/DRESSINGS) ×4 IMPLANT
SUT VIC AB 3-0 FS2 27 (SUTURE) ×4 IMPLANT
SUT VICRYL 0 UR6 27IN ABS (SUTURE) ×4 IMPLANT
TOWEL OR 17X24 6PK STRL BLUE (TOWEL DISPOSABLE) ×8 IMPLANT
TROCAR BALLN 12MMX100 BLUNT (TROCAR) ×4 IMPLANT
TROCAR XCEL DIL TIP R 11M (ENDOMECHANICALS) ×4 IMPLANT
WARMER LAPAROSCOPE (MISCELLANEOUS) ×4 IMPLANT
WATER STERILE IRR 1000ML POUR (IV SOLUTION) ×4 IMPLANT

## 2013-05-16 NOTE — Anesthesia Postprocedure Evaluation (Signed)
  Anesthesia Post-op Note  Patient: [name]  Procedure(s) Performed: Procedure(s): LAPAROSCOPIC TUBAL LIGATION (Bilateral) INTRAUTERINE DEVICE (IUD) REMOVAL (N/A) Patient is awake and responsive. Pain and nausea are reasonably well controlled. Vital signs are stable and clinically acceptable. Oxygen saturation is clinically acceptable. There are no apparent anesthetic complications at this time. Patient is ready for discharge.

## 2013-05-16 NOTE — Op Note (Signed)
Patient name  [name], Economos ZOXWRUEAV#[id number] CSN# [id number]  Juluis Mire, MD 05/16/2013 2:18 PM

## 2013-05-16 NOTE — Anesthesia Preprocedure Evaluation (Addendum)
Anesthesia Evaluation  Patient identified by MRN, date of birth, ID band Patient awake    Reviewed: Allergy & Precautions, H&P , NPO status , Patient's Chart, lab work & pertinent test results  Airway Mallampati: II TM Distance: >3 FB Neck ROM: Full    Dental no notable dental hx. (+) Teeth Intact and Caps,    Pulmonary neg pulmonary ROS,  breath sounds clear to auscultation  Pulmonary exam normal       Cardiovascular Rhythm:Regular Rate:Normal     Neuro/Psych negative neurological ROS  negative psych ROS   GI/Hepatic negative GI ROS, Neg liver ROS,   Endo/Other  negative endocrine ROSObesity  Renal/GU negative Renal ROS  negative genitourinary   Musculoskeletal negative musculoskeletal ROS (+)   Abdominal Normal abdominal exam  (+) + obese,   Peds  Hematology negative hematology ROS (+)   Anesthesia Other Findings   Reproductive/Obstetrics Irregular Menses Retained IUD Desires Permanent Contraception                          Anesthesia Physical Anesthesia Plan  ASA: II  Anesthesia Plan: General   Post-op Pain Management:    Induction: Intravenous  Airway Management Planned: Oral ETT  Additional Equipment:   Intra-op Plan:   Post-operative Plan: Extubation in OR  Informed Consent: I have reviewed the patients History and Physical, chart, labs and discussed the procedure including the risks, benefits and alternatives for the proposed anesthesia with the patient or authorized representative who has indicated his/her understanding and acceptance.   Dental advisory given  Plan Discussed with: Anesthesiologist, CRNA and Surgeon  Anesthesia Plan Comments:        Anesthesia Quick Evaluation

## 2013-05-16 NOTE — Transfer of Care (Signed)
Immediate Anesthesia Transfer of Care Note  Patient: [name]  Procedure(s) Performed: Procedure(s): LAPAROSCOPIC TUBAL LIGATION (Bilateral) INTRAUTERINE DEVICE (IUD) REMOVAL (N/A)  Patient Location: PACU  Anesthesia Type:General  Level of Consciousness: awake, alert  and oriented  Airway & Oxygen Therapy: Patient Spontanous Breathing and Patient connected to nasal cannula oxygen  Post-op Assessment: Report given to PACU RN and Post -op Vital signs reviewed and stable  Post vital signs: Reviewed and stable  Complications: No apparent anesthesia complications

## 2013-05-16 NOTE — Brief Op Note (Signed)
05/16/2013  2:17 PM  PATIENT:  [name]  47 y.o. female  PRE-OPERATIVE DIAGNOSIS:  retained iud, desires steriliaztion  POST-OPERATIVE DIAGNOSIS:  retained iud,desires sterilization  PROCEDURE:  Procedure(s): LAPAROSCOPIC TUBAL LIGATION (Bilateral) INTRAUTERINE DEVICE (IUD) REMOVAL (N/A)  SURGEON:  Surgeon(s) and Role:    * Juluis Mire, MD - Primary  PHYSICIAN ASSISTANT:   ASSISTANTS: none   ANESTHESIA:   local and general  EBL:  Total I/O In: 1300 [I.V.:1300] Out: -   BLOOD ADMINISTERED:none  DRAINS: none   LOCAL MEDICATIONS USED:  MARCAINE     SPECIMEN:  No Specimen  DISPOSITION OF SPECIMEN:  N/A  COUNTS:  YES  TOURNIQUET:  * No tourniquets in log *  DICTATION: .Other Dictation: Dictation Number [id number]  PLAN OF CARE: Discharge to home after PACU  PATIENT DISPOSITION:  PACU - hemodynamically stable.   Delay start of Pharmacological VTE agent (>24hrs) due to surgical blood loss or risk of bleeding: not applicable

## 2013-05-16 NOTE — Anesthesia Procedure Notes (Signed)
Procedure Name: Intubation Date/Time: 05/16/2013 1:32 PM Performed by: Graciela Husbands Pre-anesthesia Checklist: Suction available, Emergency Drugs available, Timeout performed, Patient identified and Patient being monitored Patient Re-evaluated:Patient Re-evaluated prior to inductionOxygen Delivery Method: Circle system utilized Preoxygenation: Pre-oxygenation with 100% oxygen Intubation Type: IV induction Ventilation: Mask ventilation without difficulty Tube size: 7.0 mm Number of attempts: 1 Airway Equipment and Method: Lighted stylet Placement Confirmation: positive ETCO2 and breath sounds checked- equal and bilateral Secured at: 21 cm Tube secured with: Tape Dental Injury: Teeth and Oropharynx as per pre-operative assessment  Difficulty Due To: Difficulty was anticipated

## 2013-05-16 NOTE — H&P (Signed)
  History and physical exam unchanged 

## 2013-05-16 NOTE — Preoperative (Signed)
Beta Blockers   Reason not to administer Beta Blockers:Not Applicable 

## 2013-05-17 ENCOUNTER — Encounter (HOSPITAL_COMMUNITY): Payer: Self-pay | Admitting: Obstetrics and Gynecology

## 2013-05-17 NOTE — Op Note (Signed)
[name], HARTS NO.:  [id number]  MEDICAL RECORD NO.:  [id number]  LOCATION:  [facility]                          FACILITY:  [facility]  PHYSICIAN:  Juluis Mire, M.D.   DATE OF BIRTH:  03-02-1966  DATE OF PROCEDURE:  05/16/2013 DATE OF DISCHARGE:  05/16/2013                              OPERATIVE REPORT   PREOPERATIVE DIAGNOSES: 1. Retained intrauterine device. 2. Multiparity, desires sterility.  POSTOPERATIVE DIAGNOSES: 1. Retained intrauterine device. 2. Multiparity, desires sterility.  PROCEDURE:  Cervical dilatation with retrieval of intrauterine device. Laparoscopic bilateral tubal ligation.  SURGEON:  Juluis Mire, MD  ANESTHESIA:  General endotracheal.  ESTIMATED BLOOD LOSS:  Minimal.  PACKS AND DRAINS:  None.  INTRAOPERATIVE BLOOD PLACED:  None.  COMPLICATIONS:  None.  INDICATION:  Dictated in history and physical.  PROCEDURE IN DETAIL:  The patient was taken to the OR and placed in supine position.  After satisfactory level of general endotracheal anesthesia was obtained, the patient was placed in dorsal lithotomy position using the Allen stirrups.  The abdomen, perineum, and vagina were prepped out with Betadine.  The patient was then draped in sterile field.  A speculum was placed in vaginal vault.  The cervix was grasped with single-tooth tenaculum.  Uterus was dilated to a size 23 Pratt dilator.  Using Randall stone forceps, we could retrieve the IUD intact. Hulka tenaculum was then put in place.  Bladder was emptied by in and out catheterization.  Subumbilical incision made with the knife.  We tried to several times insert a Veress needle, but kept getting high pressures.  We therefore went to open laparoscopy.  We identified the fascia and was entered sharply.  Incision was fashioned laterally. Peritoneum was entered sharply.  Incision of peritoneum extended laterally.  The open laparoscopic trocar was put in place.  The  balloon inflated and the trocar was secured, laparoscope was introduced. Visualization revealed no evidence of injury to adjacent organs.  Upper abdomen including liver tip of the gallbladder and both lateral gutters including appendix were clear.  Uterus, tubes, and ovaries were unremarkable.  Using the bipolar, each tube was cauterized for a distance of 2 cm.  Coagulation was continued until resistance was met 0. We then re-coagulated the same area, desiccating the tubes, coagulation did extend into the mesosalpinx.  At this point in time, there was no evidence of injury to adjacent organs.  The abdomen was deflated with carbon dioxide.  The laparoscope was removed.  The laparoscopic trocars were removed.  Fascia closed with figure-of-eight of 0 Vicryl.  Skin was closed in an interrupted subcuticulars of 4-0 Vicryl.  Sponge and instrument count was correct by circulating nurse x2.  The patient tolerated the tolerated procedure well, was returned to recovery room in good condition.     Juluis Mire, M.D.     JSM/MEDQ  D:  05/16/2013  T:  05/16/2013  Job:  [id number]

## 2013-08-22 ENCOUNTER — Other Ambulatory Visit: Payer: Self-pay

## 2014-02-06 ENCOUNTER — Encounter (HOSPITAL_COMMUNITY): Payer: Self-pay | Admitting: Emergency Medicine

## 2014-02-06 ENCOUNTER — Emergency Department (HOSPITAL_COMMUNITY)
Admission: EM | Admit: 2014-02-06 | Discharge: 2014-02-06 | Disposition: A | Discharge status: Home / Self Care | Payer: 59 | Attending: Emergency Medicine | Admitting: Emergency Medicine

## 2014-02-06 ENCOUNTER — Emergency Department (HOSPITAL_COMMUNITY): Payer: 59

## 2014-02-06 DIAGNOSIS — Z3202 Encounter for pregnancy test, result negative: Secondary | ICD-10-CM | POA: Insufficient documentation

## 2014-02-06 DIAGNOSIS — R0609 Other forms of dyspnea: Secondary | ICD-10-CM | POA: Insufficient documentation

## 2014-02-06 DIAGNOSIS — R0989 Other specified symptoms and signs involving the circulatory and respiratory systems: Secondary | ICD-10-CM | POA: Insufficient documentation

## 2014-02-06 DIAGNOSIS — Z79899 Other long term (current) drug therapy: Secondary | ICD-10-CM | POA: Insufficient documentation

## 2014-02-06 DIAGNOSIS — Z791 Long term (current) use of non-steroidal anti-inflammatories (NSAID): Secondary | ICD-10-CM | POA: Insufficient documentation

## 2014-02-06 DIAGNOSIS — R079 Chest pain, unspecified: Secondary | ICD-10-CM | POA: Insufficient documentation

## 2014-02-06 LAB — BASIC METABOLIC PANEL
BUN: 14 mg/dL (ref 6–23)
CO2: 25 mEq/L (ref 19–32)
Calcium: 9.4 mg/dL (ref 8.4–10.5)
Chloride: 103 mEq/L (ref 96–112)
Creatinine, Ser: 0.69 mg/dL (ref 0.50–1.10)
GFR calc Af Amer: >90 mL/min (ref >90)
GFR calc non Af Amer: >90 mL/min (ref >90)
Glucose, Bld: 101 mg/dL — ABNORMAL HIGH (ref 70–99)
Potassium: 3.6 mEq/L — ABNORMAL LOW (ref 3.7–5.3)
Sodium: 140 mEq/L (ref 137–147)

## 2014-02-06 LAB — D-DIMER, QUANTITATIVE (NOT AT ARMC): D-Dimer, Quant: <0.27 ug/mL-FEU (ref 0.00–0.48)

## 2014-02-06 LAB — I-STAT TROPONIN, ED
Troponin i, poc: 0 ng/mL (ref 0.00–0.08)
Troponin i, poc: 0 ng/mL (ref 0.00–0.08)

## 2014-02-06 LAB — CBC
HCT: 39.9 % (ref 36.0–46.0)
Hemoglobin: 13.5 g/dL (ref 12.0–15.0)
MCH: 31 pg (ref 26.0–34.0)
MCHC: 33.8 g/dL (ref 30.0–36.0)
MCV: 91.5 fL (ref 78.0–100.0)
Platelets: 339 10*3/uL (ref 150–400)
RBC: 4.36 MIL/uL (ref 3.87–5.11)
RDW: 13.1 % (ref 11.5–15.5)
WBC: 14.3 10*3/uL — ABNORMAL HIGH (ref 4.0–10.5)

## 2014-02-06 LAB — POC URINE PREG, ED: Preg Test, Ur: NEGATIVE

## 2014-02-06 MED ORDER — ASPIRIN 81 MG PO CHEW
324.0000 mg | CHEWABLE_TABLET | Freq: Once | ORAL | Status: AC
  Administered 2014-02-06: 324 mg via ORAL

## 2014-02-06 MED ORDER — ASPIRIN 81 MG PO CHEW
CHEWABLE_TABLET | ORAL | Status: AC
  Filled 2014-02-06: qty 4

## 2014-02-06 NOTE — Discharge Instructions (Signed)
Chest Pain (Nonspecific) °It is often hard to give a specific diagnosis for the cause of chest pain. There is always a chance that your pain could be related to something serious, such as a heart attack or a blood clot in the lungs. You need to follow up with your caregiver for further evaluation. °CAUSES  °· Heartburn. °· Pneumonia or bronchitis. °· Anxiety or stress. °· Inflammation around your heart (pericarditis) or lung (pleuritis or pleurisy). °· A blood clot in the lung. °· A collapsed lung (pneumothorax). It can develop suddenly on its own (spontaneous pneumothorax) or from injury (trauma) to the chest. °· Shingles infection (herpes zoster virus). °The chest wall is composed of bones, muscles, and cartilage. Any of these can be the source of the pain. °· The bones can be bruised by injury. °· The muscles or cartilage can be strained by coughing or overwork. °· The cartilage can be affected by inflammation and become sore (costochondritis). °DIAGNOSIS  °Lab tests or other studies, such as X-rays, electrocardiography, stress testing, or cardiac imaging, may be needed to find the cause of your pain.  °TREATMENT  °· Treatment depends on what may be causing your chest pain. Treatment may include: °· Acid blockers for heartburn. °· Anti-inflammatory medicine. °· Pain medicine for inflammatory conditions. °· Antibiotics if an infection is present. °· You may be advised to change lifestyle habits. This includes stopping smoking and avoiding alcohol, caffeine, and chocolate. °· You may be advised to keep your head raised (elevated) when sleeping. This reduces the chance of acid going backward from your stomach into your esophagus. °· Most of the time, nonspecific chest pain will improve within 2 to 3 days with rest and mild pain medicine. °HOME CARE INSTRUCTIONS  °· If antibiotics were prescribed, take your antibiotics as directed. Finish them even if you start to feel better. °· For the next few days, avoid physical  activities that bring on chest pain. Continue physical activities as directed. °· Do not smoke. °· Avoid drinking alcohol. °· Only take over-the-counter or prescription medicine for pain, discomfort, or fever as directed by your caregiver. °· Follow your caregiver's suggestions for further testing if your chest pain does not go away. °· Keep any follow-up appointments you made. If you do not go to an appointment, you could develop lasting (chronic) problems with pain. If there is any problem keeping an appointment, you must call to reschedule. °SEEK MEDICAL CARE IF:  °· You think you are having problems from the medicine you are taking. Read your medicine instructions carefully. °· Your chest pain does not go away, even after treatment. °· You develop a rash with blisters on your chest. °SEEK IMMEDIATE MEDICAL CARE IF:  °· You have increased chest pain or pain that spreads to your arm, neck, jaw, back, or abdomen. °· You develop shortness of breath, an increasing cough, or you are coughing up blood. °· You have severe back or abdominal pain, feel nauseous, or vomit. °· You develop severe weakness, fainting, or chills. °· You have a fever. °THIS IS AN EMERGENCY. Do not wait to see if the pain will go away. Get medical help at once. Call your local emergency services (911 in [place] Do not drive yourself to the hospital. °MAKE SURE YOU:  °· Understand these instructions. °· Will watch your condition. °· Will get help right away if you are not doing well or get worse. °Document Released: 07/13/2005 Document Revised: 12/26/2011 Document Reviewed: 05/08/2008 °[employer]® Patient Information ©2014 [employer]   [employer]. ° °

## 2014-02-06 NOTE — ED Notes (Signed)
Pt states that when she came in this am has been having chest pain/pressure on the left side.  Pt feels like heart is beating fast.  No sob but feels like she needs to take a sigh all the time.

## 2014-02-06 NOTE — ED Provider Notes (Signed)
CSN: [id number]     Arrival date & time 02/06/14  1416 History   First MD Initiated Contact with Patient 02/06/14 1456     Chief Complaint  Patient presents with  . Chest Pain     (Consider location/radiation/quality/duration/timing/severity/associated sxs/prior Treatment) HPI  This a 48 year old female with no significant past medical history who presents with chest pain. Patient works Engineer, [employer] (consulting). Patient states that in the past she has had difficulty with dyspnea on exertion. She was seen approximately 1.5 years ago by cardiology, Dr. Harrington Challenger, and had a stress test and echocardiogram which were reportedly negative. Patient was also seen by pulmonology.  Patient states that this morning she started work and had left-sided pressure-like chest pain that she describes as squeezing. She states that she tried to ignore. She denies any shortness of breath or diaphoresis at that time.  She states after lunch her pain got worse. She noted her heart rate to be greater than 100. She denies any palpitations. She denies any recent fevers or cough. Pain does not get worse with breathing. Her pain is 0/10. Patient did receive 324 aspirin.  Patient denies any recent hospitalizations, travel, or unilateral leg swelling.  History reviewed. No pertinent past medical history. Past Surgical History  Procedure Laterality Date  . Cesarean section    . Laparoscopic tubal ligation Bilateral 05/16/2013    Procedure: LAPAROSCOPIC TUBAL LIGATION;  Surgeon: Darlyn Chamber, MD;  Location: [facility];  Service: Gynecology;  Laterality: Bilateral;  . Iud removal N/A 05/16/2013    Procedure: INTRAUTERINE DEVICE (IUD) REMOVAL;  Surgeon: Darlyn Chamber, MD;  Location: [facility];  Service: Gynecology;  Laterality: N/A;   Family History  Problem Relation Age of Onset  . Heart disease    . Colon cancer    . Heart attack     History  Substance Use Topics  . Smoking status: Never Smoker   . Smokeless tobacco: Not on file  . Alcohol Use:  Yes     Comment: soc   OB History   Grav Para Term Preterm Abortions TAB SAB Ect Mult Living                 Review of Systems  Constitutional: Negative for fever.  Respiratory: Positive for chest tightness. Negative for cough and shortness of breath.   Cardiovascular: Positive for chest pain. Negative for leg swelling.  Gastrointestinal: Negative for nausea, vomiting and abdominal pain.  Genitourinary: Negative for dysuria.  Musculoskeletal: Negative for back pain.  Neurological: Negative for headaches.  All other systems reviewed and are negative.     Allergies  Tetracycline  Home Medications   Prior to Admission medications   Medication Sig Start Date End Date Taking? Authorizing Provider  acetaminophen (TYLENOL) 500 MG tablet Take 1,000 mg by mouth every 6 (six) hours as needed for pain.   Yes Historical Provider, MD  albuterol (PROVENTIL HFA;VENTOLIN HFA) 108 (90 BASE) MCG/ACT inhaler Inhale 2 puffs into the lungs every 6 (six) hours as needed for wheezing or shortness of breath.   Yes Historical Provider, MD  cholecalciferol (VITAMIN D) 1000 UNITS tablet Take 1,000 Units by mouth daily as needed (when low of vit d).   Yes Historical Provider, MD  ibuprofen (ADVIL,MOTRIN) 200 MG tablet Take 400 mg by mouth every 6 (six) hours as needed for moderate pain.   Yes Historical Provider, MD   BP 112/70  Pulse 81  Temp(Src) 97.8 F (36.6 C) (Oral)  Resp 18  Wt 180  lb (81.647 kg)  SpO2 97%  LMP 01/25/2014 Physical Exam  Nursing note and vitals reviewed. Constitutional: She is oriented to person, place, and time. She appears well-developed and well-nourished.  HENT:  Head: Normocephalic and atraumatic.  Eyes: Pupils are equal, round, and reactive to light.  Neck: Neck supple. No JVD present.  Cardiovascular: Normal rate, regular rhythm and normal heart sounds.   No murmur heard. Pulmonary/Chest: Effort normal and breath sounds normal. No respiratory distress. She has no  wheezes. She exhibits no tenderness.  Abdominal: Soft. There is no tenderness.  Musculoskeletal: She exhibits no edema.  Negative Homans sign  Neurological: She is alert and oriented to person, place, and time.  Skin: Skin is warm and dry.  Psychiatric: She has a normal mood and affect.    ED Course  Procedures (including critical care time) Labs Review Labs Reviewed  CBC - Abnormal; Notable for the following:    WBC 14.3 (*)    All other components within normal limits  BASIC METABOLIC PANEL - Abnormal; Notable for the following:    Potassium 3.6 (*)    Glucose, Bld 101 (*)    All other components within normal limits  D-DIMER, QUANTITATIVE  I-STAT TROPOININ, ED  POC URINE PREG, ED  Randolm Idol, ED    Imaging Review Dg Chest 2 View  02/06/2014   CLINICAL DATA:  Chest pain, pressure  EXAM: CHEST  2 VIEW  COMPARISON:  03/11/2013  FINDINGS: Cardiomediastinal silhouette is stable. Mild elevation of the right hemidiaphragm. No acute infiltrate or pleural effusion. No pulmonary edema. Bony thorax is unremarkable.  IMPRESSION: No active cardiopulmonary disease.   Electronically Signed   By: Lahoma Crocker M.D.   On: 02/06/2014 15:03     EKG Interpretation   Date/Time:  Thursday February 06 2014 14:21:21 EDT Ventricular Rate:  108 PR Interval:  118 QRS Duration: 84 QT Interval:  322 QTC Calculation: 431 R Axis:   75 Text Interpretation:  Sinus tachycardia Otherwise normal ECG Confirmed by  Siler Mavis  MD, Loma Sousa ([id number]) on 02/06/2014 3:14:02 PM      MDM   Final diagnoses:  Chest pain    Patient presents with chest pain. Onset of chest pain was this morning. EKG is nonischemic and shows sinus tachycardia. Patient reports having a full cardiac and pulmonology workup within the last 2 years. I reviewed the patient's chart it appears she had a stress echo in July 2013. She's also been seen by Dr. Tarri Fuller in pulmonology. She states the symptoms are similar to the recurrent symptoms  she has had. Delta troponin is negative. D-dimer is negative and have low suspicion for pulmonary embolism. Patient has been chest pain-free while in the ER. At this time have low suspicion for cardiac etiology of the chest pain; however, have discussed with the patient that I cannot fully rule out heart disease while in the ER. She is to followup closely with her cardiologist. Patient stated understanding and was given strict return precautions. Patient also may benefit from Holter monitoring will defer this for cardiology.  After history, exam, and medical workup I feel the patient has been appropriately medically screened and is safe for discharge home. Pertinent diagnoses were discussed with the patient. Patient was given return precautions.   Merryl Hacker, MD 02/06/14 [phone]

## 2014-03-16 ENCOUNTER — Encounter (HOSPITAL_COMMUNITY): Payer: Self-pay | Admitting: Emergency Medicine

## 2014-03-16 ENCOUNTER — Emergency Department (HOSPITAL_COMMUNITY)
Admission: EM | Admit: 2014-03-16 | Discharge: 2014-03-17 | Disposition: A | Discharge status: Home / Self Care | Payer: 59 | Attending: Emergency Medicine | Admitting: Emergency Medicine

## 2014-03-16 ENCOUNTER — Emergency Department (HOSPITAL_COMMUNITY): Payer: 59

## 2014-03-16 DIAGNOSIS — Z79899 Other long term (current) drug therapy: Secondary | ICD-10-CM | POA: Insufficient documentation

## 2014-03-16 DIAGNOSIS — S32019A Unspecified fracture of first lumbar vertebra, initial encounter for closed fracture: Secondary | ICD-10-CM

## 2014-03-16 DIAGNOSIS — W19XXXA Unspecified fall, initial encounter: Secondary | ICD-10-CM

## 2014-03-16 DIAGNOSIS — R296 Repeated falls: Secondary | ICD-10-CM | POA: Insufficient documentation

## 2014-03-16 DIAGNOSIS — Y9389 Activity, other specified: Secondary | ICD-10-CM | POA: Insufficient documentation

## 2014-03-16 DIAGNOSIS — M549 Dorsalgia, unspecified: Secondary | ICD-10-CM

## 2014-03-16 DIAGNOSIS — Y9289 Other specified places as the place of occurrence of the external cause: Secondary | ICD-10-CM | POA: Insufficient documentation

## 2014-03-16 DIAGNOSIS — S32009A Unspecified fracture of unspecified lumbar vertebra, initial encounter for closed fracture: Secondary | ICD-10-CM | POA: Insufficient documentation

## 2014-03-16 MED ORDER — ONDANSETRON 4 MG PO TBDP: ORAL_TABLET | ORAL | Status: DC

## 2014-03-16 MED ORDER — ONDANSETRON HCL 4 MG/2ML IJ SOLN
4.0000 mg | Freq: Once | INTRAMUSCULAR | Status: AC
  Administered 2014-03-16: 4 mg via INTRAVENOUS
  Filled 2014-03-16: qty 2

## 2014-03-16 MED ORDER — CYCLOBENZAPRINE HCL 5 MG PO TABS: 5.0000 mg | ORAL_TABLET | Freq: Three times a day (TID) | ORAL | Status: DC | PRN

## 2014-03-16 MED ORDER — HYDROMORPHONE HCL PF 1 MG/ML IJ SOLN
1.0000 mg | Freq: Once | INTRAMUSCULAR | Status: AC
  Administered 2014-03-16: 1 mg via INTRAVENOUS
  Filled 2014-03-16: qty 1

## 2014-03-16 MED ORDER — OXYCODONE-ACETAMINOPHEN 5-325 MG PO TABS: 1.0000 | ORAL_TABLET | ORAL | Status: DC | PRN

## 2014-03-16 MED ORDER — CYCLOBENZAPRINE HCL 10 MG PO TABS
5.0000 mg | ORAL_TABLET | Freq: Once | ORAL | Status: AC
  Administered 2014-03-16: 5 mg via ORAL
  Filled 2014-03-16: qty 1

## 2014-03-16 MED ORDER — KETOROLAC TROMETHAMINE 30 MG/ML IJ SOLN
30.0000 mg | Freq: Once | INTRAMUSCULAR | Status: AC
  Administered 2014-03-16: 30 mg via INTRAVENOUS
  Filled 2014-03-16: qty 1

## 2014-03-16 NOTE — ED Notes (Signed)
Remains in x-ray  

## 2014-03-16 NOTE — ED Provider Notes (Signed)
Patient seen/examined in the Emergency Department in conjunction with Resident Physician Provider Fredric Dine Patient reports back pain s/p fall Exam : awake/alert, able to move lower extremities but limited due to pain Plan: will need neurosurgery consultation for lumbar fracture    Sharyon Cable, MD 03/16/14 2128

## 2014-03-16 NOTE — ED Notes (Signed)
Dr. Kunz at [facility] 

## 2014-03-16 NOTE — ED Notes (Signed)
Pt to xray. Family x2 arrives to room.

## 2014-03-16 NOTE — ED Notes (Signed)
Pt up to b/r, ambulatory for ~ 25ft. C/o light headed and nauseated. Given w/c. Pt to b/r emesis x1. "feels a little better after vomiting, zofran given, back to room w/o incident or change. Dr. Fredric Dine in to room.

## 2014-03-16 NOTE — Progress Notes (Signed)
Orthopedic Tech Progress Note Patient Details:  [name] 12/13/1965 [id number] Called [employer] for brace order [name], female   DOB: [birth year], 48 y.o.   MRN: [id number]   [name] 03/16/2014, 9:44 PM

## 2014-03-16 NOTE — ED Notes (Addendum)
Pt back from xray, tolerated well, NAD, calm, Dr. Christy Gentles in to see pt, family x2 at [facility]. VSS. New xrays ordered.

## 2014-03-16 NOTE — ED Provider Notes (Signed)
CSN: [id number]     Arrival date & time 03/16/14  1957 History   First MD Initiated Contact with Patient 03/16/14 1959     Chief Complaint  Patient presents with  . Fall  . Back Pain     (Consider location/radiation/quality/duration/timing/severity/associated sxs/prior Treatment) Patient is a 48 y.o. female presenting with fall. The history is provided by the patient and medical records. No language interpreter was used.  Fall This is a new problem. The current episode started today. The problem occurs constantly. The problem has been unchanged. Pertinent negatives include no abdominal pain, chest pain, congestion, coughing, fever, headaches, nausea, neck pain, visual change or vomiting. Associated symptoms comments: Severe lower back pain. The symptoms are aggravated by twisting and exertion. She has tried rest and immobilization for the symptoms. The treatment provided no relief.    History reviewed. No pertinent past medical history. Past Surgical History  Procedure Laterality Date  . Cesarean section    . Laparoscopic tubal ligation Bilateral 05/16/2013    Procedure: LAPAROSCOPIC TUBAL LIGATION;  Surgeon: Darlyn Chamber, MD;  Location: [facility];  Service: Gynecology;  Laterality: Bilateral;  . Iud removal N/A 05/16/2013    Procedure: INTRAUTERINE DEVICE (IUD) REMOVAL;  Surgeon: Darlyn Chamber, MD;  Location: [facility];  Service: Gynecology;  Laterality: N/A;   Family History  Problem Relation Age of Onset  . Heart disease    . Colon cancer    . Heart attack     History  Substance Use Topics  . Smoking status: Never Smoker   . Smokeless tobacco: Not on file  . Alcohol Use: Yes     Comment: soc   OB History   Grav Para Term Preterm Abortions TAB SAB Ect Mult Living                 Review of Systems  Constitutional: Negative for fever.  HENT: Negative for congestion.   Respiratory: Negative for cough.   Cardiovascular: Negative for chest pain.  Gastrointestinal: Negative for  nausea, vomiting and abdominal pain.  Musculoskeletal: Negative for neck pain.  Neurological: Negative for headaches.  All other systems reviewed and are negative.     Allergies  Tetracycline  Home Medications   Prior to Admission medications   Medication Sig Start Date End Date Taking? Authorizing Provider  acetaminophen (TYLENOL) 500 MG tablet Take 1,000 mg by mouth every 6 (six) hours as needed for pain.    Historical Provider, MD  albuterol (PROVENTIL HFA;VENTOLIN HFA) 108 (90 BASE) MCG/ACT inhaler Inhale 2 puffs into the lungs every 6 (six) hours as needed for wheezing or shortness of breath.    Historical Provider, MD  cholecalciferol (VITAMIN D) 1000 UNITS tablet Take 1,000 Units by mouth daily as needed (when low of vit d).    Historical Provider, MD  ibuprofen (ADVIL,MOTRIN) 200 MG tablet Take 400 mg by mouth every 6 (six) hours as needed for moderate pain.    Historical Provider, MD   BP 128/63  Pulse 90  Temp(Src) 98.2 F (36.8 C) (Oral)  Resp 15  SpO2 100%  LMP 02/14/2014 Physical Exam  Nursing note and vitals reviewed. Constitutional: She is oriented to person, place, and time. She appears well-developed and well-nourished.  HENT:  Head: Normocephalic and atraumatic.  Left Ear: External ear normal.  Nose: Nose normal.  Eyes: Conjunctivae are normal. Pupils are equal, round, and reactive to light.  Neck: Normal range of motion. Neck supple.  Cardiovascular: Normal rate, regular rhythm, normal  heart sounds and intact distal pulses.   Pulmonary/Chest: Effort normal and breath sounds normal.  Abdominal: Soft. Bowel sounds are normal.  Genitourinary: Rectum normal. Rectal exam shows anal tone normal.  Musculoskeletal: Normal range of motion. She exhibits tenderness (TTP over midline of junction of t/l spine; no step offs or deformities). She exhibits no edema.  Neurological: She is alert and oriented to person, place, and time. She has normal strength and normal  reflexes. No cranial nerve deficit or sensory deficit. Coordination normal.  Skin: Skin is warm and dry.  Psychiatric: She has a normal mood and affect.    ED Course  Procedures (including critical care time) Labs Review Labs Reviewed - No data to display  Imaging Review Dg Thoracic Spine W/swimmers  03/16/2014   CLINICAL DATA:  Fall with back pain  EXAM: THORACIC SPINE - 2 VIEW + SWIMMERS  COMPARISON:  None.  FINDINGS: Overlapping soft tissue attenuation limits lateral evaluation of the upper and mid thoracic spine. No evidence of acute thoracic spine fracture or subluxation. There is a L1 vertebral body fracture, described on dedicated imaging.  IMPRESSION: 1. L1 vertebral body fracture, reference dedicated imaging. 2. No acute osseous findings in the thoracic spine.   Electronically Signed   By: Jorje Guild M.D.   On: 03/16/2014 21:03   Dg Lumbar Spine Complete  03/16/2014   CLINICAL DATA:  Fall today.  Back pain.  EXAM: LUMBAR SPINE - COMPLETE 4+ VIEW  COMPARISON:  None.  FINDINGS: There is a fracture of L1. The fractures is of the upper endplate with depression of its central anterior aspect, and mild comminution of the upper endplate. And anterior fracture fragment is displaced anteriorly by 5 mm. The posterior aspect of the vertebral body is normal in height. There is no posteriorly displaced fracture fragment. Spinal canal is not encroach upon.  No other fractures. No spondylolisthesis. There is significant degenerative changes. Soft tissues are unremarkable.  IMPRESSION: Mild fracture of L1 as described above. No retropulsed fracture fragment or evidence of spinal canal encroachment.   Electronically Signed   By: Lajean Manes M.D.   On: 03/16/2014 21:03     EKG Interpretation None      MDM   Final diagnoses:  Back pain  Fall  L1 vertebral fracture    Patient presents to the ED with back pain after a fall.  Patient went to kick ball, lost balance, and landed on her lower  back.  She denies weakness, numbness, incontinence, or any other symptoms.  Af and VS unremarkable.  PE as above and showed TTP over midline of T/L spine.  XRs showed L1 VB fracture described as above.  NSU consulted and recs included TLSO and pain control.  She was given IVF, dilaudid, and flexeril for symptomatic relief.  TLSO brace fitted and patient able to ambulate with minimal pain.  Will discharge with muscle relaxers, percoect, zofran, and NSU follow up within the next 7 days.  Return precautions given and patient voiced understanding.      Corlis Leak, MD 03/16/14 [phone]

## 2014-03-16 NOTE — ED Notes (Signed)
Pain meds given, "feeling a little better", remains alert, NAD, calm, interactive. Given socks & warm blanket per request.

## 2014-03-16 NOTE — ED Notes (Signed)
"  feels better", TLSO being fit to pt at this time by the TLSO rep. Alert, NAD, calm, family at [facility].

## 2014-03-16 NOTE — ED Provider Notes (Signed)
I have personally seen and examined the patient.  I have discussed the plan of care with the resident.  I have reviewed the documentation on PMH/FH/Soc. History.  I have reviewed the documentation of the resident and agree.   Sharyon Cable, MD 03/16/14 [phone]

## 2014-03-16 NOTE — ED Notes (Signed)
Dr. Fredric Dine in to see pt

## 2014-03-16 NOTE — ED Notes (Signed)
Dr. Fredric Dine at [facility]

## 2014-03-16 NOTE — Discharge Instructions (Signed)
Back Pain, Adult Low back pain is very common. About 1 in 5 people have back pain.The cause of low back pain is rarely dangerous. The pain often gets better over time.About half of people with a sudden onset of back pain feel better in just 2 weeks. About 8 in 10 people feel better by 6 weeks.  CAUSES Some common causes of back pain include:  Strain of the muscles or ligaments supporting the spine.  Wear and tear (degeneration) of the spinal discs.  Arthritis.  Direct injury to the back. DIAGNOSIS Most of the time, the direct cause of low back pain is not known.However, back pain can be treated effectively even when the exact cause of the pain is unknown.Answering your caregiver's questions about your overall health and symptoms is one of the most accurate ways to make sure the cause of your pain is not dangerous. If your caregiver needs more information, he or she may order lab work or imaging tests (X-rays or MRIs).However, even if imaging tests show changes in your back, this usually does not require surgery. HOME CARE INSTRUCTIONS For many people, back pain returns.Since low back pain is rarely dangerous, it is often a condition that people can learn to [facility] their own.   Remain active. It is stressful on the back to sit or stand in one place. Do not sit, drive, or stand in one place for more than 30 minutes at a time. Take short walks on level surfaces as soon as pain allows.Try to increase the length of time you walk each day.  Do not stay in bed.Resting more than 1 or 2 days can delay your recovery.  Do not avoid exercise or work.Your body is made to move.It is not dangerous to be active, even though your back may hurt.Your back will likely heal faster if you return to being active before your pain is gone.  Pay attention to your body when you bend and lift. Many people have less discomfortwhen lifting if they bend their knees, keep the load close to their bodies,and  avoid twisting. Often, the most comfortable positions are those that put less stress on your recovering back.  Find a comfortable position to sleep. Use a firm mattress and lie on your side with your knees slightly bent. If you lie on your back, put a pillow under your knees.  Only take over-the-counter or prescription medicines as directed by your caregiver. Over-the-counter medicines to reduce pain and inflammation are often the most helpful.Your caregiver may prescribe muscle relaxant drugs.These medicines help dull your pain so you can more quickly return to your normal activities and healthy exercise.  Put ice on the injured area.  Put ice in a plastic bag.  Place a towel between your skin and the bag.  Leave the ice on for 15-20 minutes, 03-04 times a day for the first 2 to 3 days. After that, ice and heat may be alternated to reduce pain and spasms.  Ask your caregiver about trying back exercises and gentle massage. This may be of some benefit.  Avoid feeling anxious or stressed.Stress increases muscle tension and can worsen back pain.It is important to recognize when you are anxious or stressed and learn ways to manage it.Exercise is a great option. SEEK MEDICAL CARE IF:  You have pain that is not relieved with rest or medicine.  You have pain that does not improve in 1 week.  You have new symptoms.  You are generally not feeling well. SEEK  IMMEDIATE MEDICAL CARE IF:   You have pain that radiates from your back into your legs.  You develop new bowel or bladder control problems.  You have unusual weakness or numbness in your arms or legs.  You develop nausea or vomiting.  You develop abdominal pain.  You feel faint. Document Released: 10/03/2005 Document Revised: 04/03/2012 Document Reviewed: 02/21/2011 [facility] Patient Information 2014 [place], [place].    You should wear TLSO at all times.

## 2014-03-16 NOTE — ED Notes (Addendum)
Pt here by [employer]. EMS, here on [facility] s/p stepped on ball while getting out of pool and fell from standing position onto ground. C/o low back pain. Gradually progressively worse. Describes as spasms, cramping and tightening. No meds PTA. 2 beers earlier today. Wet from swimming, wearing bathing suit. Alert, NAD, calm, interactive. logrolled on arrival and removed from [place]. Pain pinpointed to lumbar back. Denies LOC, head or neck pain or sx other than pain.

## 2014-03-26 ENCOUNTER — Telehealth (HOSPITAL_BASED_OUTPATIENT_CLINIC_OR_DEPARTMENT_OTHER): Payer: Self-pay

## 2014-03-26 NOTE — Telephone Encounter (Signed)
Picked up work note Dr Stevie Kern signed and called to let pt know work note was ready.  Left message @ pts home # asking her to return call.

## 2014-03-27 DIAGNOSIS — S22089S Unspecified fracture of T11-T12 vertebra, sequela: Secondary | ICD-10-CM | POA: Insufficient documentation

## 2014-03-27 DIAGNOSIS — S32019S Unspecified fracture of first lumbar vertebra, sequela: Secondary | ICD-10-CM | POA: Insufficient documentation

## 2014-03-28 ENCOUNTER — Other Ambulatory Visit: Payer: Self-pay | Admitting: Neurosurgery

## 2014-03-28 DIAGNOSIS — S32009A Unspecified fracture of unspecified lumbar vertebra, initial encounter for closed fracture: Secondary | ICD-10-CM

## 2014-04-01 ENCOUNTER — Ambulatory Visit
Admission: RE | Admit: 2014-04-01 | Discharge: 2014-04-01 | Disposition: A | Discharge status: Home / Self Care | Payer: 59 | Source: Ambulatory Visit | Attending: Neurosurgery | Admitting: Neurosurgery

## 2014-04-01 DIAGNOSIS — S32009A Unspecified fracture of unspecified lumbar vertebra, initial encounter for closed fracture: Secondary | ICD-10-CM

## 2014-10-22 ENCOUNTER — Encounter: Payer: Self-pay | Admitting: Internal Medicine

## 2015-06-11 ENCOUNTER — Encounter: Payer: Self-pay | Admitting: Internal Medicine

## 2015-08-25 ENCOUNTER — Encounter: Payer: Self-pay | Admitting: Gastroenterology

## 2016-02-16 ENCOUNTER — Emergency Department
Admission: EM | Admit: 2016-02-16 | Discharge: 2016-02-16 | Disposition: A | Discharge status: Home / Self Care | Payer: 59 | Source: Home / Self Care | Attending: Family Medicine | Admitting: Family Medicine

## 2016-02-16 ENCOUNTER — Encounter: Payer: Self-pay | Admitting: *Deleted

## 2016-02-16 ENCOUNTER — Emergency Department (INDEPENDENT_AMBULATORY_CARE_PROVIDER_SITE_OTHER): Payer: 59

## 2016-02-16 DIAGNOSIS — R0782 Intercostal pain: Secondary | ICD-10-CM | POA: Diagnosis not present

## 2016-02-16 DIAGNOSIS — R0781 Pleurodynia: Secondary | ICD-10-CM

## 2016-02-16 DIAGNOSIS — M4186 Other forms of scoliosis, lumbar region: Secondary | ICD-10-CM | POA: Diagnosis not present

## 2016-02-16 DIAGNOSIS — S20212A Contusion of left front wall of thorax, initial encounter: Secondary | ICD-10-CM | POA: Diagnosis not present

## 2016-02-16 DIAGNOSIS — S8011XA Contusion of right lower leg, initial encounter: Secondary | ICD-10-CM | POA: Diagnosis not present

## 2016-02-16 MED ORDER — MELOXICAM 15 MG PO TABS: 15.0000 mg | ORAL_TABLET | Freq: Every day | ORAL | Status: DC
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
## 2016-02-16 NOTE — Discharge Instructions (Signed)
Continue to apply ice pack for 20 to 30 minutes, 3 to 4 times daily  Continue until pain decreases.  Recommend below the knee support hose daily.  May take Flexeril 5mg , one-half tab at bedtime.      Chest Contusion A chest contusion is a deep bruise on your chest area. Contusions are the result of an injury that caused bleeding under the skin. A chest contusion may involve bruising of the skin, muscles, or ribs. The contusion may turn blue, purple, or yellow. Minor injuries will give you a painless contusion, but more severe contusions may stay painful and swollen for a few weeks. CAUSES  A contusion is usually caused by a blow, trauma, or direct force to an area of the body. SYMPTOMS   Swelling and redness of the injured area.  Discoloration of the injured area.  Tenderness and soreness of the injured area.  Pain. DIAGNOSIS  The diagnosis can be made by taking a history and performing a physical exam. An X-ray, CT scan, or MRI may be needed to determine if there were any associated injuries, such as broken bones (fractures) or internal injuries. TREATMENT  Often, the best treatment for a chest contusion is resting, icing, and applying cold compresses to the injured area. Deep breathing exercises may be recommended to reduce the risk of pneumonia. Over-the-counter medicines may also be recommended for pain control. HOME CARE INSTRUCTIONS   Put ice on the injured area.  Put ice in a plastic bag.  Place a towel between your skin and the bag.  Leave the ice on for 15-20 minutes, 03-04 times a day.  Only take over-the-counter or prescription medicines as directed by your caregiver. Your caregiver may recommend avoiding anti-inflammatory medicines (aspirin, ibuprofen, and naproxen) for 48 hours because these medicines may increase bruising.  Rest the injured area.  Perform deep-breathing exercises as directed by your caregiver.  Stop smoking if you smoke.  Do not lift objects over 5  pounds (2.3 kg) for 3 days or longer if recommended by your caregiver. SEEK IMMEDIATE MEDICAL CARE IF:   You have increased bruising or swelling.  You have pain that is getting worse.  You have difficulty breathing.  You have dizziness, weakness, or fainting.  You have blood in your urine or stool.  You cough up or vomit blood.  Your swelling or pain is not relieved with medicines. MAKE SURE YOU:   Understand these instructions.  Will watch your condition.  Will get help right away if you are not doing well or get worse.   This information is not intended to replace advice given to you by your health care provider. Make sure you discuss any questions you have with your health care provider.   Document Released: 06/28/2001 Document Revised: 06/27/2012 Document Reviewed: 03/26/2012 Elsevier Interactive Patient Education [employer].

## 2016-10-31 DIAGNOSIS — Z01419 Encounter for gynecological examination (general) (routine) without abnormal findings: Secondary | ICD-10-CM | POA: Diagnosis not present

## 2016-10-31 DIAGNOSIS — Z1231 Encounter for screening mammogram for malignant neoplasm of breast: Secondary | ICD-10-CM | POA: Diagnosis not present

## 2016-10-31 DIAGNOSIS — Z6838 Body mass index (BMI) 38.0-38.9, adult: Secondary | ICD-10-CM | POA: Diagnosis not present

## 2016-11-01 ENCOUNTER — Encounter: Payer: Self-pay | Admitting: Internal Medicine

## 2016-11-01 DIAGNOSIS — Z8249 Family history of ischemic heart disease and other diseases of the circulatory system: Secondary | ICD-10-CM | POA: Diagnosis not present

## 2016-11-01 DIAGNOSIS — Z13228 Encounter for screening for other metabolic disorders: Secondary | ICD-10-CM | POA: Diagnosis not present

## 2016-11-01 DIAGNOSIS — Z1329 Encounter for screening for other suspected endocrine disorder: Secondary | ICD-10-CM | POA: Diagnosis not present

## 2016-11-01 DIAGNOSIS — Z1321 Encounter for screening for nutritional disorder: Secondary | ICD-10-CM | POA: Diagnosis not present

## 2016-11-01 DIAGNOSIS — Z13220 Encounter for screening for lipoid disorders: Secondary | ICD-10-CM | POA: Diagnosis not present

## 2016-12-06 ENCOUNTER — Ambulatory Visit (AMBULATORY_SURGERY_CENTER): Payer: Self-pay

## 2016-12-06 VITALS — Ht 59.5 in | Wt 193.2 lb

## 2016-12-06 DIAGNOSIS — Z800 Family history of malignant neoplasm of digestive organs: Secondary | ICD-10-CM

## 2016-12-06 MED ORDER — NA SULFATE-K SULFATE-MG SULF 17.5-3.13-1.6 GM/177ML PO SOLN: 1.0000 | Freq: Once | ORAL | 0 refills | Status: AC

## 2016-12-06 MED FILL — SUPREP BOWEL PREP KIT: 17.5-3.13-1 | 1 days supply | Qty: 354 | Fill #0

## 2016-12-06 NOTE — Progress Notes (Signed)
Denies allergies to eggs or soy products. Denies complication of anesthesia or sedation. Denies use of weight loss medication. Denies use of O2.    Patient declined Emmi. 

## 2016-12-20 ENCOUNTER — Ambulatory Visit (AMBULATORY_SURGERY_CENTER): Payer: 59 | Admitting: Internal Medicine

## 2016-12-20 ENCOUNTER — Encounter: Payer: Self-pay | Admitting: Internal Medicine

## 2016-12-20 ENCOUNTER — Encounter: Payer: 59 | Admitting: Internal Medicine

## 2016-12-20 VITALS — BP 132/84 | HR 75 | Temp 97.5°F | Resp 22 | Ht 59.0 in | Wt 193.0 lb

## 2016-12-20 DIAGNOSIS — K635 Polyp of colon: Secondary | ICD-10-CM

## 2016-12-20 DIAGNOSIS — Z800 Family history of malignant neoplasm of digestive organs: Secondary | ICD-10-CM | POA: Diagnosis present

## 2016-12-20 DIAGNOSIS — Z1212 Encounter for screening for malignant neoplasm of rectum: Secondary | ICD-10-CM | POA: Diagnosis not present

## 2016-12-20 DIAGNOSIS — D123 Benign neoplasm of transverse colon: Secondary | ICD-10-CM | POA: Diagnosis not present

## 2016-12-20 DIAGNOSIS — Z1211 Encounter for screening for malignant neoplasm of colon: Secondary | ICD-10-CM

## 2016-12-20 MED ORDER — SODIUM CHLORIDE 0.9 % IV SOLN: 500.0000 mL | INTRAVENOUS | Status: DC

## 2016-12-20 NOTE — Patient Instructions (Signed)
YOU HAD AN ENDOSCOPIC PROCEDURE TODAY AT THE [facility]:   Refer to the procedure report that was given to you for any specific questions about what was found during the examination.  If the procedure report does not answer your questions, please call your gastroenterologist to clarify.  If you requested that your care partner not be given the details of your procedure findings, then the procedure report has been included in a sealed envelope for you to review at your convenience later.  YOU SHOULD EXPECT: Some feelings of bloating in the abdomen. Passage of more gas than usual.  Walking can help get rid of the air that was put into your GI tract during the procedure and reduce the bloating. If you had a lower endoscopy (such as a colonoscopy or flexible sigmoidoscopy) you may notice spotting of blood in your stool or on the toilet paper. If you underwent a bowel prep for your procedure, you may not have a normal bowel movement for a few days.  Please Note:  You might notice some irritation and congestion in your nose or some drainage.  This is from the oxygen used during your procedure.  There is no need for concern and it should clear up in a day or so.  SYMPTOMS TO REPORT IMMEDIATELY:   Following lower endoscopy (colonoscopy or flexible sigmoidoscopy):  Excessive amounts of blood in the stool  Significant tenderness or worsening of abdominal pains  Swelling of the abdomen that is new, acute  Fever of 100F or higher   For urgent or emergent issues, a gastroenterologist can be reached at any hour by calling [phone].   DIET:  We do recommend a small meal at first, but then you may proceed to your regular diet.  Drink plenty of fluids but you should avoid alcoholic beverages for 24 hours.  ACTIVITY:  You should plan to take it easy for the rest of today and you should NOT DRIVE or use heavy machinery until tomorrow (because of the sedation medicines used during the test).     FOLLOW UP: Our staff will call the number listed on your records the next business day following your procedure to check on you and address any questions or concerns that you may have regarding the information given to you following your procedure. If we do not reach you, we will leave a message.  However, if you are feeling well and you are not experiencing any problems, there is no need to return our call.  We will assume that you have returned to your regular daily activities without incident.  If any biopsies were taken you will be contacted by phone or by letter within the next 1-3 weeks.  Please call us at [phone] if you have not heard about the biopsies in 3 weeks.    SIGNATURES/CONFIDENTIALITY: You and/or your care partner have signed paperwork which will be entered into your electronic medical record.  These signatures attest to the fact that that the information above on your After Visit Summary has been reviewed and is understood.  Full responsibility of the confidentiality of this discharge information lies with you and/or your care-partner.  Polyp information given.  Recall colonoscopy 5 years-2023 

## 2016-12-20 NOTE — Progress Notes (Signed)
Pt's states no medical or surgical changes since previsit or office visit. 

## 2016-12-20 NOTE — Progress Notes (Signed)
Report to PACU, RN, vss, BBS= Clear.  

## 2016-12-20 NOTE — Progress Notes (Signed)
Called to room to assist during endoscopic procedure.  Patient ID and intended procedure confirmed with present staff. Received instructions for my participation in the procedure from the performing physician.  

## 2016-12-20 NOTE — Op Note (Signed)
[facility] Patient Name: [name] Procedure Date: 12/20/2016 7:32 AM MRN: [id number] Endoscopist: Jerene Bears , MD Age: 51 Referring MD:  Date of Birth: 22-Jan-1966 Gender: Female Account #: [id number] Procedure:                Colonoscopy Indications:              Screening in patient at increased risk: Family                            history of 1st-degree relative with colorectal                            cancer (father, rectum, age 22), This is the                            patient's first colonoscopy Medicines:                Monitored Anesthesia Care Procedure:                Pre-Anesthesia Assessment:                           - Prior to the procedure, a History and Physical                            was performed, and patient medications and                            allergies were reviewed. The patient's tolerance of                            previous anesthesia was also reviewed. The risks                            and benefits of the procedure and the sedation                            options and risks were discussed with the patient.                            All questions were answered, and informed consent                            was obtained. Prior Anticoagulants: The patient has                            taken no previous anticoagulant or antiplatelet                            agents. ASA Grade Assessment: II - A patient with                            mild systemic disease. After reviewing the risks  and benefits, the patient was deemed in                            satisfactory condition to undergo the procedure.                           After obtaining informed consent, the colonoscope                            was passed under direct vision. Throughout the                            procedure, the patient's blood pressure, pulse, and                            oxygen saturations were monitored continuously.  The                            Colonoscope was introduced through the anus and                            advanced to the the cecum, identified by                            appendiceal orifice and ileocecal valve. The                            colonoscopy was performed without difficulty. The                            patient tolerated the procedure well. The quality                            of the bowel preparation was good (fair in the                            right colon clearing to good with copious                            irrigation and lavage) (SuPrep causing nausea and                            some vomiting with 2nd half of the prep). The                            ileocecal valve, appendiceal orifice, and rectum                            were photographed. Scope In: 7:43:16 AM Scope Out: [id number] AM Scope Withdrawal Time: 0 hours 11 minutes 19 seconds  Total Procedure Duration: 0 hours 15 minutes 40 seconds  Findings:                 The perianal and digital rectal examinations were  normal.                           A 3 mm polyp was found in the transverse colon. The                            polyp was sessile. The polyp was removed with a                            cold biopsy forceps. Resection and retrieval were                            complete.                           The exam was otherwise without abnormality on                            direct and retroflexion views. Complications:            No immediate complications. Estimated Blood Loss:     Estimated blood loss: none. Impression:               - One 3 mm polyp in the transverse colon, removed                            with a cold biopsy forceps. Resected and retrieved.                           - The examination was otherwise normal on direct                            and retroflexion views. Recommendation:           - Patient has a contact number available for                             emergencies. The signs and symptoms of potential                            delayed complications were discussed with the                            patient. Return to normal activities tomorrow.                            Written discharge instructions were provided to the                            patient.                           - Resume previous diet.                           - Continue present medications.                           -  Await pathology results.                           - Repeat colonoscopy in 5 years for screening                            purposes. Jerene Bears, MD 12/20/2016 8:02:36 AM This report has been signed electronically.

## 2016-12-21 ENCOUNTER — Telehealth: Payer: Self-pay | Admitting: *Deleted

## 2016-12-21 NOTE — Telephone Encounter (Signed)
  Follow up Call-  Call back number 12/20/2016  Post procedure Call Back phone  # [phone]  Permission to leave phone message Yes  Some recent data might be hidden     Patient questions:  Do you have a fever, pain , or abdominal swelling? No. Pain Score  0 *  Have you tolerated food without any problems? Yes.    Have you been able to return to your normal activities? Yes.    Do you have any questions about your discharge instructions: Diet   No. Medications  No. Follow up visit  No.  Do you have questions or concerns about your Care? No.  Actions: * If pain score is 4 or above: No action needed, pain <4.

## 2016-12-22 ENCOUNTER — Encounter: Payer: Self-pay | Admitting: Internal Medicine

## 2017-02-21 DIAGNOSIS — L821 Other seborrheic keratosis: Secondary | ICD-10-CM | POA: Diagnosis not present

## 2017-02-21 DIAGNOSIS — D1801 Hemangioma of skin and subcutaneous tissue: Secondary | ICD-10-CM | POA: Diagnosis not present

## 2017-02-21 DIAGNOSIS — L814 Other melanin hyperpigmentation: Secondary | ICD-10-CM | POA: Diagnosis not present

## 2017-02-21 DIAGNOSIS — D225 Melanocytic nevi of trunk: Secondary | ICD-10-CM | POA: Diagnosis not present

## 2017-02-21 DIAGNOSIS — L918 Other hypertrophic disorders of the skin: Secondary | ICD-10-CM | POA: Diagnosis not present

## 2017-09-22 ENCOUNTER — Encounter: Payer: Self-pay | Admitting: *Deleted

## 2017-09-22 ENCOUNTER — Other Ambulatory Visit: Payer: Self-pay

## 2017-09-22 ENCOUNTER — Emergency Department
Admission: EM | Admit: 2017-09-22 | Discharge: 2017-09-22 | Disposition: A | Discharge status: Home / Self Care | Payer: 59 | Source: Home / Self Care | Attending: Family Medicine | Admitting: Family Medicine

## 2017-09-22 DIAGNOSIS — J069 Acute upper respiratory infection, unspecified: Secondary | ICD-10-CM | POA: Diagnosis not present

## 2017-09-22 LAB — POCT RAPID STREP A (OFFICE): Rapid Strep A Screen: NEGATIVE

## 2017-09-22 LAB — POCT INFLUENZA A/B
Influenza A, POC: NEGATIVE
Influenza B, POC: NEGATIVE

## 2017-09-22 MED ORDER — PREDNISONE 20 MG PO TABS: ORAL_TABLET | ORAL | 0 refills | Status: DC

## 2017-09-22 MED ORDER — AZITHROMYCIN 250 MG PO TABS: ORAL_TABLET | ORAL | 0 refills | Status: DC

## 2017-09-22 NOTE — ED Triage Notes (Signed)
Patient c/o sudden onset chills, aches last night later accompanied by sore throat and bilateral ear pain. Taken Nyquil cold/flu otc. Rec'd flu vac this season.

## 2017-09-22 NOTE — ED Provider Notes (Signed)
[name]    CSN: [id number] Arrival date & time: 09/22/17  1729     History   Chief Complaint Chief Complaint  Patient presents with  . Sore Throat  . Generalized Body Aches  . Otalgia    HPI [name] is a 51 y.o. female.   Last night patient suddenly developed chills, myalgias, and sore throat.  Today she awoke with increased sore throat, sinus congestion, headache, and earache.     The history is provided by the patient.    History reviewed. No pertinent past medical history.  Patient Active Problem List   Diagnosis Date Noted  . Encounter for IUD removal 05/16/2013    Class: Status post  . S/P tubal ligation 05/16/2013    Class: Status post  . IRREGULAR MENSES 09/29/2009    Past Surgical History:  Procedure Laterality Date  . CESAREAN SECTION    . IUD REMOVAL N/A 05/16/2013   Procedure: INTRAUTERINE DEVICE (IUD) REMOVAL;  Surgeon: Darlyn Chamber, MD;  Location: [facility];  Service: Gynecology;  Laterality: N/A;  . LAPAROSCOPIC TUBAL LIGATION Bilateral 05/16/2013   Procedure: LAPAROSCOPIC TUBAL LIGATION;  Surgeon: Darlyn Chamber, MD;  Location: [facility];  Service: Gynecology;  Laterality: Bilateral;    OB History    No data available       Home Medications    Prior to Admission medications   Medication Sig Start Date End Date Taking? Authorizing Provider  azithromycin (ZITHROMAX Z-PAK) 250 MG tablet Take 2 tabs today; then begin one tab once daily for 4 more days. (Rx void after 09/30/17) 09/22/17   Kandra Nicolas, MD  predniSONE (DELTASONE) 20 MG tablet Take one tab by mouth twice daily for 4 days, then one daily. Take with food. 09/22/17   Kandra Nicolas, MD    Family History Family History  Problem Relation Age of Onset  . Heart disease Unknown   . Colon cancer Unknown   . Heart attack Unknown   . Colon cancer Father     Social History Social History   Tobacco Use  . Smoking status: Never Smoker  . Smokeless tobacco: Never  Used  Substance Use Topics  . Alcohol use: Yes    Comment:  once a month  . Drug use: No     Allergies   Tetracycline   Review of Systems Review of Systems + sore throat No cough No pleuritic pain No wheezing + nasal congestion + post-nasal drainage No sinus pain/pressure No itchy/red eyes + earache No hemoptysis No SOB No fever, + chills No nausea No vomiting No abdominal pain No diarrhea No urinary symptoms No skin rash + fatigue + myalgias + headache Used OTC meds without relief   Physical Exam Triage Vital Signs ED Triage Vitals  Enc Vitals Group     BP 09/22/17 1757 136/89     Pulse Rate 09/22/17 1757 98     Resp --      Temp 09/22/17 1827 98.7 F (37.1 C)     Temp Source 09/22/17 1827 Oral     SpO2 09/22/17 1757 98 %     Weight 09/22/17 1757 189 lb (85.7 kg)     Height --      Head Circumference --      Peak Flow --      Pain Score 09/22/17 1757 7     Pain Loc --      Pain Edu? --      Excl.  in [place]? --    No data found.  Updated Vital Signs BP 136/89 (BP Location: Right Arm)   Pulse 98   Temp 98.7 F (37.1 C) (Oral)   Wt 189 lb (85.7 kg)   LMP 09/15/2017   SpO2 98%   BMI 38.17 kg/m   Visual Acuity Right Eye Distance:   Left Eye Distance:   Bilateral Distance:    Right Eye Near:   Left Eye Near:    Bilateral Near:     Physical Exam Nursing notes and Vital Signs reviewed. Appearance:  Patient appears stated age, and in no acute distress Eyes:  Pupils are equal, round, and reactive to light and accomodation.  Extraocular movement is intact.  Conjunctivae are not inflamed  Ears:  Canals normal.  Tympanic membranes normal.  Nose:  Mildly congested turbinates.  No sinus tenderness.    Pharynx:  Normal Neck:  Supple.  Enlarged posterior/lateral nodes are palpated bilaterally, tender to palpation on the left.   Lungs:  Clear to auscultation.  Breath sounds are equal.  Moving air well. Heart:  Regular rate and rhythm without murmurs,  rubs, or gallops.  Abdomen:  Nontender without masses or hepatosplenomegaly.  Bowel sounds are present.  No CVA or flank tenderness.  Extremities:  No edema.  Skin:  No rash present.    UC Treatments / Results  Labs (all labs ordered are listed, but only abnormal results are displayed) Labs Reviewed  POCT RAPID STREP A (OFFICE) negative  POCT INFLUENZA A/B negative    EKG  EKG Interpretation None       Radiology No results found.  Procedures Procedures (including critical care time)  Medications Ordered in UC Medications - No data to display   Initial Impression / Assessment and Plan / UC Course  I have reviewed the triage vital signs and the nursing notes.  Pertinent labs & imaging results that were available during my care of the patient were reviewed by me and considered in my medical decision making (see chart for details).    There is no evidence of bacterial infection today.   Treat symptomatically for now  Begin prednisone burst/taper. Take plain guaifenesin (1200mg  extended release tabs such as Mucinex) twice daily, with plenty of water, for cough and congestion.  May add Pseudoephedrine (30mg , one or two every 4 to 6 hours) for sinus congestion.  Get adequate rest.   May use Afrin nasal spray (or generic oxymetazoline) each morning for about 5 days and then discontinue.  Also recommend using saline nasal spray several times daily and saline nasal irrigation (AYR is a common brand).  Use Flonase nasal spray each morning after using Afrin nasal spray and saline nasal irrigation. Try warm salt water gargles for sore throat.  Stop all antihistamines for now, and other non-prescription cough/cold preparations. May take Delsym Cough Suppressant at bedtime for nighttime cough.  Begin Azithromycin if not improving about one week or if persistent fever develops (Given a prescription to hold, with an expiration date)     Final Clinical Impressions(s) / UC Diagnoses    Final diagnoses:  Viral URI    ED Discharge Orders        Ordered    predniSONE (DELTASONE) 20 MG tablet     09/22/17 1848    azithromycin (ZITHROMAX Z-PAK) 250 MG tablet     09/22/17 1849          Kandra Nicolas, MD 09/23/17 1154

## 2017-09-22 NOTE — Discharge Instructions (Signed)
Take plain guaifenesin (1200mg extended release tabs such as Mucinex) twice daily, with plenty of water, for cough and congestion.  May add Pseudoephedrine (30mg, one or two every 4 to 6 hours) for sinus congestion.  Get adequate rest.   °May use Afrin nasal spray (or generic oxymetazoline) each morning for about 5 days and then discontinue.  Also recommend using saline nasal spray several times daily and saline nasal irrigation (AYR is a common brand).  Use Flonase nasal spray each morning after using Afrin nasal spray and saline nasal irrigation. °Try warm salt water gargles for sore throat.  °Stop all antihistamines for now, and other non-prescription cough/cold preparations. °May take Delsym Cough Suppressant at bedtime for nighttime cough.  °Begin Azithromycin if not improving about one week or if persistent fever develops.  °

## 2017-12-14 DIAGNOSIS — Z13220 Encounter for screening for lipoid disorders: Secondary | ICD-10-CM | POA: Diagnosis not present

## 2017-12-14 DIAGNOSIS — Z8249 Family history of ischemic heart disease and other diseases of the circulatory system: Secondary | ICD-10-CM | POA: Diagnosis not present

## 2017-12-14 DIAGNOSIS — Z1329 Encounter for screening for other suspected endocrine disorder: Secondary | ICD-10-CM | POA: Diagnosis not present

## 2017-12-14 DIAGNOSIS — Z1321 Encounter for screening for nutritional disorder: Secondary | ICD-10-CM | POA: Diagnosis not present

## 2017-12-14 DIAGNOSIS — Z6837 Body mass index (BMI) 37.0-37.9, adult: Secondary | ICD-10-CM | POA: Diagnosis not present

## 2017-12-14 DIAGNOSIS — Z1231 Encounter for screening mammogram for malignant neoplasm of breast: Secondary | ICD-10-CM | POA: Diagnosis not present

## 2017-12-14 DIAGNOSIS — Z01419 Encounter for gynecological examination (general) (routine) without abnormal findings: Secondary | ICD-10-CM | POA: Diagnosis not present

## 2018-12-16 ENCOUNTER — Encounter (HOSPITAL_COMMUNITY)
Admission: EM | Disposition: A | Discharge status: Home / Self Care | Payer: Self-pay | Source: Home / Self Care | Attending: Interventional Cardiology

## 2018-12-16 ENCOUNTER — Inpatient Hospital Stay (HOSPITAL_COMMUNITY)
Admission: EM | Admit: 2018-12-16 | Discharge: 2018-12-18 | DRG: 246 | Disposition: A | Discharge status: Home / Self Care | Payer: 59 | Attending: Cardiology | Admitting: Cardiology

## 2018-12-16 ENCOUNTER — Other Ambulatory Visit: Payer: Self-pay

## 2018-12-16 ENCOUNTER — Encounter (HOSPITAL_COMMUNITY): Payer: Self-pay | Admitting: Emergency Medicine

## 2018-12-16 ENCOUNTER — Emergency Department (HOSPITAL_COMMUNITY): Payer: 59

## 2018-12-16 DIAGNOSIS — I249 Acute ischemic heart disease, unspecified: Secondary | ICD-10-CM

## 2018-12-16 DIAGNOSIS — I5041 Acute combined systolic (congestive) and diastolic (congestive) heart failure: Secondary | ICD-10-CM | POA: Diagnosis present

## 2018-12-16 DIAGNOSIS — Z8249 Family history of ischemic heart disease and other diseases of the circulatory system: Secondary | ICD-10-CM

## 2018-12-16 DIAGNOSIS — Z9861 Coronary angioplasty status: Secondary | ICD-10-CM | POA: Diagnosis not present

## 2018-12-16 DIAGNOSIS — I2510 Atherosclerotic heart disease of native coronary artery without angina pectoris: Secondary | ICD-10-CM

## 2018-12-16 DIAGNOSIS — Z6838 Body mass index (BMI) 38.0-38.9, adult: Secondary | ICD-10-CM | POA: Diagnosis not present

## 2018-12-16 DIAGNOSIS — I214 Non-ST elevation (NSTEMI) myocardial infarction: Principal | ICD-10-CM

## 2018-12-16 DIAGNOSIS — Z955 Presence of coronary angioplasty implant and graft: Secondary | ICD-10-CM

## 2018-12-16 DIAGNOSIS — I2102 ST elevation (STEMI) myocardial infarction involving left anterior descending coronary artery: Secondary | ICD-10-CM

## 2018-12-16 DIAGNOSIS — Z800 Family history of malignant neoplasm of digestive organs: Secondary | ICD-10-CM

## 2018-12-16 DIAGNOSIS — I213 ST elevation (STEMI) myocardial infarction of unspecified site: Secondary | ICD-10-CM | POA: Diagnosis not present

## 2018-12-16 DIAGNOSIS — E785 Hyperlipidemia, unspecified: Secondary | ICD-10-CM | POA: Diagnosis not present

## 2018-12-16 DIAGNOSIS — R079 Chest pain, unspecified: Secondary | ICD-10-CM | POA: Diagnosis not present

## 2018-12-16 DIAGNOSIS — I255 Ischemic cardiomyopathy: Secondary | ICD-10-CM | POA: Diagnosis not present

## 2018-12-16 DIAGNOSIS — E876 Hypokalemia: Secondary | ICD-10-CM | POA: Diagnosis present

## 2018-12-16 DIAGNOSIS — M79602 Pain in left arm: Secondary | ICD-10-CM | POA: Diagnosis not present

## 2018-12-16 DIAGNOSIS — Z881 Allergy status to other antibiotic agents status: Secondary | ICD-10-CM | POA: Diagnosis not present

## 2018-12-16 HISTORY — DX: Atherosclerotic heart disease of native coronary artery without angina pectoris: I25.10

## 2018-12-16 HISTORY — DX: Non-ST elevation (NSTEMI) myocardial infarction: I21.4

## 2018-12-16 HISTORY — DX: Coronary angioplasty status: Z98.61

## 2018-12-16 HISTORY — PX: LEFT HEART CATH AND CORONARY ANGIOGRAPHY: CATH118249

## 2018-12-16 HISTORY — PX: CORONARY/GRAFT ACUTE MI REVASCULARIZATION: CATH118305

## 2018-12-16 LAB — CBC WITH DIFFERENTIAL/PLATELET
Abs Immature Granulocytes: 0.07 10*3/uL (ref 0.00–0.07)
Basophils Absolute: 0.1 10*3/uL (ref 0.0–0.1)
Basophils Relative: 0 %
Eosinophils Absolute: 0.2 10*3/uL (ref 0.0–0.5)
Eosinophils Relative: 1 %
HCT: 39.6 % (ref 36.0–46.0)
Hemoglobin: 12.5 g/dL (ref 12.0–15.0)
Immature Granulocytes: 0 %
Lymphocytes Relative: 20 %
Lymphs Abs: 3.2 10*3/uL (ref 0.7–4.0)
MCH: 29.6 pg (ref 26.0–34.0)
MCHC: 31.6 g/dL (ref 30.0–36.0)
MCV: 93.6 fL (ref 80.0–100.0)
Monocytes Absolute: 0.8 10*3/uL (ref 0.1–1.0)
Monocytes Relative: 5 %
Neutro Abs: 11.5 10*3/uL — ABNORMAL HIGH (ref 1.7–7.7)
Neutrophils Relative %: 74 %
Platelets: 422 10*3/uL — ABNORMAL HIGH (ref 150–400)
RBC: 4.23 MIL/uL (ref 3.87–5.11)
RDW: 12.8 % (ref 11.5–15.5)
WBC: 15.8 10*3/uL — ABNORMAL HIGH (ref 4.0–10.5)
nRBC: 0 % (ref 0.0–0.2)

## 2018-12-16 LAB — POCT I-STAT 7, (LYTES, BLD GAS, ICA,H+H)
Acid-base deficit: 7 mmol/L — ABNORMAL HIGH (ref 0.0–2.0)
Bicarbonate: 20.4 mmol/L (ref 20.0–28.0)
Calcium, Ion: 1.17 mmol/L (ref 1.15–1.40)
HCT: 35 % — ABNORMAL LOW (ref 36.0–46.0)
Hemoglobin: 11.9 g/dL — ABNORMAL LOW (ref 12.0–15.0)
O2 Saturation: 94 %
Potassium: 3.2 mmol/L — ABNORMAL LOW (ref 3.5–5.1)
Sodium: 134 mmol/L — ABNORMAL LOW (ref 135–145)
TCO2: 22 mmol/L (ref 22–32)
pCO2 arterial: 49.4 mmHg — ABNORMAL HIGH (ref 32.0–48.0)
pH, Arterial: 7.224 — ABNORMAL LOW (ref 7.350–7.450)
pO2, Arterial: 86 mmHg (ref 83.0–108.0)

## 2018-12-16 LAB — COMPREHENSIVE METABOLIC PANEL
ALT: 11 U/L (ref 0–44)
AST: 14 U/L — ABNORMAL LOW (ref 15–41)
Albumin: 3.3 g/dL — ABNORMAL LOW (ref 3.5–5.0)
Alkaline Phosphatase: 68 U/L (ref 38–126)
Anion gap: 10 (ref 5–15)
BUN: 10 mg/dL (ref 6–20)
CO2: 21 mmol/L — ABNORMAL LOW (ref 22–32)
Calcium: 8.3 mg/dL — ABNORMAL LOW (ref 8.9–10.3)
Chloride: 108 mmol/L (ref 98–111)
Creatinine, Ser: 0.83 mg/dL (ref 0.44–1.00)
GFR calc Af Amer: >60 mL/min (ref >60)
GFR calc non Af Amer: >60 mL/min (ref >60)
Glucose, Bld: 174 mg/dL — ABNORMAL HIGH (ref 70–99)
Potassium: 3.4 mmol/L — ABNORMAL LOW (ref 3.5–5.1)
Sodium: 139 mmol/L (ref 135–145)
Total Bilirubin: 0.8 mg/dL (ref 0.3–1.2)
Total Protein: 6.1 g/dL — ABNORMAL LOW (ref 6.5–8.1)

## 2018-12-16 LAB — TROPONIN I
Troponin I: 0.03 ng/mL (ref <0.03)
Troponin I: 9.79 ng/mL (ref <0.03)
Troponin I: 10.92 ng/mL (ref <0.03)

## 2018-12-16 LAB — TSH: TSH: 4.499 u[IU]/mL (ref 0.350–4.500)

## 2018-12-16 LAB — PROTIME-INR
INR: 1 (ref 0.8–1.2)
Prothrombin Time: 12.9 seconds (ref 11.4–15.2)

## 2018-12-16 LAB — MRSA PCR SCREENING: MRSA by PCR: NEGATIVE

## 2018-12-16 LAB — LIPID PANEL
Cholesterol: 154 mg/dL (ref 0–200)
HDL: 35 mg/dL — ABNORMAL LOW (ref >40)
LDL Cholesterol: 104 mg/dL — ABNORMAL HIGH (ref 0–99)
Total CHOL/HDL Ratio: 4.4 RATIO
Triglycerides: 74 mg/dL (ref <150)
VLDL: 15 mg/dL (ref 0–40)

## 2018-12-16 LAB — APTT: aPTT: 26 seconds (ref 24–36)

## 2018-12-16 LAB — BRAIN NATRIURETIC PEPTIDE: B Natriuretic Peptide: 23.5 pg/mL (ref 0.0–100.0)

## 2018-12-16 LAB — I-STAT BETA HCG BLOOD, ED (MC, WL, AP ONLY): I-stat hCG, quantitative: <5 m[IU]/mL (ref <5)

## 2018-12-16 LAB — POCT ACTIVATED CLOTTING TIME: Activated Clotting Time: 246 seconds

## 2018-12-16 SURGERY — CORONARY/GRAFT ACUTE MI REVASCULARIZATION
Anesthesia: LOCAL

## 2018-12-16 MED ORDER — ACETAMINOPHEN 325 MG PO TABS
650.0000 mg | ORAL_TABLET | ORAL | Status: DC | PRN
  Administered 2018-12-17: 650 mg via ORAL
  Filled 2018-12-16: qty 2

## 2018-12-16 MED ORDER — NITROGLYCERIN IN D5W 200-5 MCG/ML-% IV SOLN
INTRAVENOUS | Status: AC | PRN
  Administered 2018-12-16: 10 ug/min via INTRAVENOUS

## 2018-12-16 MED ORDER — SODIUM CHLORIDE 0.9 % IV SOLN: 250.0000 mL | INTRAVENOUS | Status: DC | PRN

## 2018-12-16 MED ORDER — SODIUM CHLORIDE 0.9 % IV SOLN: INTRAVENOUS | Status: DC

## 2018-12-16 MED ORDER — MIDAZOLAM HCL 2 MG/2ML IJ SOLN
INTRAMUSCULAR | Status: AC
  Filled 2018-12-16: qty 2

## 2018-12-16 MED ORDER — HEPARIN SODIUM (PORCINE) 1000 UNIT/ML IJ SOLN
INTRAMUSCULAR | Status: DC | PRN
  Administered 2018-12-16: 5000 [IU] via INTRAVENOUS
  Administered 2018-12-16: 6000 [IU] via INTRAVENOUS
  Administered 2018-12-16: 3000 [IU] via INTRAVENOUS

## 2018-12-16 MED ORDER — ALUM & MAG HYDROXIDE-SIMETH 200-200-20 MG/5ML PO SUSP
30.0000 mL | Freq: Once | ORAL | Status: DC
  Filled 2018-12-16: qty 30

## 2018-12-16 MED ORDER — SODIUM CHLORIDE 0.9% FLUSH
3.0000 mL | Freq: Two times a day (BID) | INTRAVENOUS | Status: DC
  Administered 2018-12-16 – 2018-12-18 (×3): 3 mL via INTRAVENOUS

## 2018-12-16 MED ORDER — MIDAZOLAM HCL 2 MG/2ML IJ SOLN
INTRAMUSCULAR | Status: DC | PRN
  Administered 2018-12-16: 0.5 mg via INTRAVENOUS

## 2018-12-16 MED ORDER — HEPARIN SODIUM (PORCINE) 1000 UNIT/ML IJ SOLN
INTRAMUSCULAR | Status: AC
  Filled 2018-12-16: qty 1

## 2018-12-16 MED ORDER — ASPIRIN 81 MG PO CHEW
81.0000 mg | CHEWABLE_TABLET | Freq: Every day | ORAL | Status: DC
  Administered 2018-12-17 – 2018-12-18 (×2): 81 mg via ORAL
  Filled 2018-12-16 (×2): qty 1

## 2018-12-16 MED ORDER — HEPARIN (PORCINE) IN NACL 1000-0.9 UT/500ML-% IV SOLN
INTRAVENOUS | Status: DC | PRN
  Administered 2018-12-16 (×2): 500 mL

## 2018-12-16 MED ORDER — FENTANYL CITRATE (PF) 100 MCG/2ML IJ SOLN
INTRAMUSCULAR | Status: DC | PRN
  Administered 2018-12-16: 25 ug via INTRAVENOUS

## 2018-12-16 MED ORDER — VERAPAMIL HCL 2.5 MG/ML IV SOLN
INTRAVENOUS | Status: AC
  Filled 2018-12-16: qty 2

## 2018-12-16 MED ORDER — MIDAZOLAM HCL 2 MG/2ML IJ SOLN
1.0000 mg | Freq: Once | INTRAMUSCULAR | Status: AC
  Administered 2018-12-16: 1 mg via INTRAVENOUS

## 2018-12-16 MED ORDER — HYDRALAZINE HCL 20 MG/ML IJ SOLN: 5.0000 mg | INTRAMUSCULAR | Status: AC | PRN

## 2018-12-16 MED ORDER — FENTANYL CITRATE (PF) 100 MCG/2ML IJ SOLN
INTRAMUSCULAR | Status: AC
  Filled 2018-12-16: qty 2

## 2018-12-16 MED ORDER — LABETALOL HCL 5 MG/ML IV SOLN: 10.0000 mg | INTRAVENOUS | Status: AC | PRN

## 2018-12-16 MED ORDER — NITROGLYCERIN 1 MG/10 ML FOR IR/CATH LAB
INTRA_ARTERIAL | Status: AC
  Filled 2018-12-16: qty 10

## 2018-12-16 MED ORDER — ALPRAZOLAM 0.25 MG PO TABS: 0.2500 mg | ORAL_TABLET | Freq: Three times a day (TID) | ORAL | Status: DC | PRN

## 2018-12-16 MED ORDER — MORPHINE SULFATE (PF) 2 MG/ML IV SOLN
2.0000 mg | Freq: Once | INTRAVENOUS | Status: AC
  Administered 2018-12-16: 2 mg via INTRAVENOUS
  Filled 2018-12-16: qty 1

## 2018-12-16 MED ORDER — POTASSIUM CHLORIDE CRYS ER 20 MEQ PO TBCR
30.0000 meq | EXTENDED_RELEASE_TABLET | Freq: Two times a day (BID) | ORAL | Status: AC
  Administered 2018-12-16 (×2): 30 meq via ORAL
  Filled 2018-12-16 (×2): qty 1

## 2018-12-16 MED ORDER — TICAGRELOR 90 MG PO TABS
ORAL_TABLET | ORAL | Status: AC
  Filled 2018-12-16: qty 2

## 2018-12-16 MED ORDER — NITROGLYCERIN 1 MG/10 ML FOR IR/CATH LAB
INTRA_ARTERIAL | Status: DC | PRN
  Administered 2018-12-16: 200 ug via INTRACORONARY

## 2018-12-16 MED ORDER — SODIUM CHLORIDE 0.9 % WEIGHT BASED INFUSION
1.2000 mL/kg/h | INTRAVENOUS | Status: AC
  Administered 2018-12-16 (×2): 1.2 mL/kg/h via INTRAVENOUS

## 2018-12-16 MED ORDER — VERAPAMIL HCL 2.5 MG/ML IV SOLN
INTRAVENOUS | Status: DC | PRN
  Administered 2018-12-16: 10 mL via INTRA_ARTERIAL

## 2018-12-16 MED ORDER — IOHEXOL 350 MG/ML SOLN
INTRAVENOUS | Status: DC | PRN
  Administered 2018-12-16: 170 mL

## 2018-12-16 MED ORDER — PANTOPRAZOLE SODIUM 40 MG PO PACK
40.0000 mg | PACK | Freq: Every day | ORAL | Status: DC
  Administered 2018-12-16: 40 mg
  Filled 2018-12-16: qty 20

## 2018-12-16 MED ORDER — TICAGRELOR 90 MG PO TABS
90.0000 mg | ORAL_TABLET | Freq: Two times a day (BID) | ORAL | Status: DC
  Administered 2018-12-16 – 2018-12-18 (×4): 90 mg via ORAL
  Filled 2018-12-16 (×4): qty 1

## 2018-12-16 MED ORDER — FENTANYL CITRATE (PF) 100 MCG/2ML IJ SOLN
50.0000 ug | Freq: Once | INTRAMUSCULAR | Status: AC
  Administered 2018-12-16: 50 ug via INTRAVENOUS

## 2018-12-16 MED ORDER — SODIUM CHLORIDE 0.9% FLUSH: 3.0000 mL | INTRAVENOUS | Status: DC | PRN

## 2018-12-16 MED ORDER — PANTOPRAZOLE SODIUM 40 MG PO TBEC
40.0000 mg | DELAYED_RELEASE_TABLET | Freq: Every day | ORAL | Status: DC
  Administered 2018-12-17 – 2018-12-18 (×2): 40 mg via ORAL
  Filled 2018-12-16 (×2): qty 1

## 2018-12-16 MED ORDER — ATORVASTATIN CALCIUM 80 MG PO TABS
80.0000 mg | ORAL_TABLET | Freq: Every day | ORAL | Status: DC
  Administered 2018-12-16 – 2018-12-17 (×2): 80 mg via ORAL
  Filled 2018-12-16 (×2): qty 1

## 2018-12-16 MED ORDER — NITROGLYCERIN IN D5W 200-5 MCG/ML-% IV SOLN
INTRAVENOUS | Status: AC
  Filled 2018-12-16: qty 250

## 2018-12-16 MED ORDER — LIDOCAINE HCL (PF) 1 % IJ SOLN
INTRAMUSCULAR | Status: DC | PRN
  Administered 2018-12-16: 2 mL

## 2018-12-16 MED ORDER — OXYCODONE HCL 5 MG PO TABS
5.0000 mg | ORAL_TABLET | ORAL | Status: DC | PRN
  Administered 2018-12-16: 5 mg via ORAL
  Filled 2018-12-16: qty 1

## 2018-12-16 MED ORDER — TICAGRELOR 90 MG PO TABS
ORAL_TABLET | ORAL | Status: DC | PRN
  Administered 2018-12-16: 180 mg via ORAL

## 2018-12-16 MED ORDER — ONDANSETRON HCL 4 MG/2ML IJ SOLN
4.0000 mg | Freq: Four times a day (QID) | INTRAMUSCULAR | Status: DC | PRN
  Administered 2018-12-16 (×3): 4 mg via INTRAVENOUS
  Filled 2018-12-16 (×3): qty 2

## 2018-12-16 MED ORDER — HEPARIN (PORCINE) IN NACL 1000-0.9 UT/500ML-% IV SOLN
INTRAVENOUS | Status: AC
  Filled 2018-12-16: qty 1000

## 2018-12-16 MED ORDER — HEPARIN SODIUM (PORCINE) 5000 UNIT/ML IJ SOLN
5000.0000 [IU] | Freq: Three times a day (TID) | INTRAMUSCULAR | Status: DC
  Administered 2018-12-16 – 2018-12-17 (×3): 5000 [IU] via SUBCUTANEOUS
  Filled 2018-12-16 (×3): qty 1

## 2018-12-16 MED ORDER — LIDOCAINE HCL (PF) 1 % IJ SOLN
INTRAMUSCULAR | Status: AC
  Filled 2018-12-16: qty 30

## 2018-12-16 MED ORDER — SODIUM CHLORIDE 0.9 % IV SOLN
INTRAVENOUS | Status: AC | PRN
  Administered 2018-12-16: 10 mL/h via INTRAVENOUS

## 2018-12-16 MED ORDER — CARVEDILOL 3.125 MG PO TABS
3.1250 mg | ORAL_TABLET | Freq: Two times a day (BID) | ORAL | Status: DC
  Administered 2018-12-16 – 2018-12-18 (×5): 3.125 mg via ORAL
  Filled 2018-12-16 (×5): qty 1

## 2018-12-16 MED ORDER — HEPARIN SODIUM (PORCINE) 5000 UNIT/ML IJ SOLN: 4000.0000 [IU] | Freq: Once | INTRAMUSCULAR | Status: DC

## 2018-12-16 SURGICAL SUPPLY — 18 items
BALLN SAPPHIRE 2.5X12 (BALLOONS) ×2
BALLN SAPPHIRE ~~LOC~~ 3.25X12 (BALLOONS) ×2 IMPLANT
BALLOON SAPPHIRE 2.5X12 (BALLOONS) ×1 IMPLANT
CATH 5FR JL3.5 JR4 ANG PIG MP (CATHETERS) ×2 IMPLANT
CATH VISTA GUIDE 6FR XB3 (CATHETERS) ×2 IMPLANT
DEVICE RAD COMP TR BAND LRG (VASCULAR PRODUCTS) ×2 IMPLANT
GLIDESHEATH SLEND A-KIT 6F 22G (SHEATH) ×2 IMPLANT
GUIDEWIRE INQWIRE 1.5J.035X260 (WIRE) ×1 IMPLANT
INQWIRE 1.5J .035X260CM (WIRE) ×2
KIT ENCORE 26 ADVANTAGE (KITS) ×2 IMPLANT
KIT HEART LEFT (KITS) ×2 IMPLANT
PACK CARDIAC CATHETERIZATION (CUSTOM PROCEDURE TRAY) ×2 IMPLANT
SHEATH PROBE COVER 6X72 (BAG) ×2 IMPLANT
STENT SYNERGY DES 3X16 (Permanent Stent) ×2 IMPLANT
TRANSDUCER W/STOPCOCK (MISCELLANEOUS) ×2 IMPLANT
TUBING CIL FLEX 10 FLL-RA (TUBING) ×2 IMPLANT
WIRE ASAHI PROWATER 180CM (WIRE) ×2 IMPLANT
WIRE HI TORQ VERSACORE-J 145CM (WIRE) ×2 IMPLANT

## 2018-12-16 NOTE — ED Triage Notes (Addendum)
Patient is from home with chest pain that started 11pm last night, pain getting worse during the night.  She took 6 81mg  ASA pta of EMS, EMS gave 7 sl nitro en route to ED with 10mg  of morphine and 100 mcg of fentanyl pta arrival to ED.  Patient continues with chest pain, no nausea or vomiting, feels like she is going to pass out.  She is diaphoretic.

## 2018-12-16 NOTE — ED Notes (Signed)
Patient to Cath lab MD, RN and EMT.

## 2018-12-16 NOTE — Progress Notes (Signed)
Still c/o chest discomfort. PA paged. Will try GI cocktail. Still with some nausea sipping on coke, does not want to eat. Tried popscicle and  some broth. Voices concern about other vessel that is blocked.  Continues on NTG. PA aware of troponin orders received. Patient does not want to take any PO medication until she can have zofran again.  Will wait on PO medications until that time. No change in ECG tracing. Vitals stable.

## 2018-12-16 NOTE — Plan of Care (Signed)
Continues to have some pain in chest despite NTG and morphine. Taking naps. Concerned about other blockage that did not get stented.  MD kept aware of patient concerns and chest pain.

## 2018-12-16 NOTE — ED Notes (Signed)
Patient is purple in color when laying down on upper back.  When sitting up the color dissipates.

## 2018-12-16 NOTE — Progress Notes (Signed)
No change in EKG 12 lead.  NTG  Increased. Patient closing eyes., family at bedside.  Po meds taken, no further vomiting.

## 2018-12-16 NOTE — H&P (Signed)
HPI: 53 yr old with no significant past medical history admitted with acute onset of severe chest pain. Started at 11:30 pm yesterday and she took ASA which did not relieve it. Was associated with sweating. Finally decided to come to the ED because of unrelenting pain where she was not able to lie down. Was taken to the cath lab because of dynamic EKG changes and found to have 99% pLAD stenosis s/p PCI with 2x15 DES. She is still sore in her chest but much better.    Review of Systems:     Cardiac Review of Systems: {Y] = yes [ ]  = no  Chest Pain [ y   ]  Resting SOB [   ] Exertional SOB  [ y ]  Orthopnea [  ]   Pedal Edema [   ]    Palpitations [  ] Syncope  [  ]   Presyncope [   ]  General Review of Systems: [Y] = yes [  ]=no Constitional: recent weight change [  ]; anorexia [  ]; fatigue [  ]; nausea [  ]; night sweats [  ]; fever [  ]; or chills [  ];                                                                     Dental: poor dentition[  ];   Eye : blurred vision [  ]; diplopia [   ]; vision changes [  ];  Amaurosis fugax[  ]; Resp: cough [  ];  wheezing[  ];  hemoptysis[  ]; shortness of breath[y  ]; paroxysmal nocturnal dyspnea[  ]; dyspnea on exertion[  ]; or orthopnea[  ];  GI:  gallstones[  ], vomiting[  ];  dysphagia[  ]; melena[  ];  hematochezia [  ]; heartburn[  ];   GU: kidney stones [  ]; hematuria[  ];   dysuria [  ];  nocturia[  ];               Skin: rash [  ], swelling[  ];, hair loss[  ];  peripheral edema[  ];  or itching[  ]; Musculosketetal: myalgias[  ];  joint swelling[  ];  joint erythema[  ];  joint pain[  ];  back pain[  ];  Heme/Lymph: bruising[  ];  bleeding[  ];  anemia[  ];  Neuro: TIA[  ];  headaches[  ];  stroke[  ];  vertigo[  ];  seizures[  ];   paresthesias[  ];  difficulty walking[  ];  Psych:depression[  ]; anxiety[  ];  Endocrine: diabetes[  ];  thyroid dysfunction[  ];  Other:  History reviewed. No pertinent past medical  history.  @[facility] @   Allergies  Allergen Reactions  . Tetracycline Rash    Social History   Socioeconomic History  . Marital status: Married    Spouse name: Not on file  . Number of children: Not on file  . Years of education: Not on file  . Highest education level: Not on file  Occupational History  . Not on file  Social Needs  . Financial resource strain: Not on file  . Food insecurity:    Worry: Not  on file    Inability: Not on file  . Transportation needs:    Medical: Not on file    Non-medical: Not on file  Tobacco Use  . Smoking status: Never Smoker  . Smokeless tobacco: Never Used  Substance and Sexual Activity  . Alcohol use: Yes    Comment:  once a month  . Drug use: No  . Sexual activity: Yes    Birth control/protection: I.U.D.  Lifestyle  . Physical activity:    Days per week: Not on file    Minutes per session: Not on file  . Stress: Not on file  Relationships  . Social connections:    Talks on phone: Not on file    Gets together: Not on file    Attends religious service: Not on file    Active member of club or organization: Not on file    Attends meetings of clubs or organizations: Not on file    Relationship status: Not on file  . Intimate partner violence:    Fear of current or ex partner: Not on file    Emotionally abused: Not on file    Physically abused: Not on file    Forced sexual activity: Not on file  Other Topics Concern  . Not on file  Social History Narrative  . Not on file    Family History: Strong FH of CAD. Mother and father had CAD and so did his sibling. Mother and brother had premature CAD. Father had colon cancer.  PHYSICAL EXAM: Vitals:   12/16/18 1600 12/16/18 1622  BP: 108/64   Pulse: 70   Resp: (!) 22   Temp:  98 F (36.7 C)  SpO2: 98%    General:  Well appearing. No respiratory difficulty HEENT: normal Neck: supple. no JVD. Carotids 2+ bilat; no bruits. No lymphadenopathy or thryomegaly appreciated. Cor:  PMI nondisplaced. Regular rate & rhythm. No rubs, gallops or murmurs. Lungs: clear Abdomen: soft, nontender, nondistended. No hepatosplenomegaly. No bruits or masses. Good bowel sounds. Extremities: no cyanosis, clubbing, rash, edema Neuro: alert & oriented x 3, cranial nerves grossly intact. moves all 4 extremities w/o difficulty. Affect pleasant.  ECG: TWI in I, aVL, V2-5  Results for orders placed or performed during the hospital encounter of 12/16/18 (from the past 24 hour(s))  CBC with Differential/Platelet     Status: Abnormal   Collection Time: 12/16/18  6:29 AM  Result Value Ref Range   WBC 15.8 (H) 4.0 - 10.5 K/uL   RBC 4.23 3.87 - 5.11 MIL/uL   Hemoglobin 12.5 12.0 - 15.0 g/dL   HCT 39.6 36.0 - 46.0 %   MCV 93.6 80.0 - 100.0 fL   MCH 29.6 26.0 - 34.0 pg   MCHC 31.6 30.0 - 36.0 g/dL   RDW 12.8 11.5 - 15.5 %   Platelets 422 (H) 150 - 400 K/uL   nRBC 0.0 0.0 - 0.2 %   Neutrophils Relative % 74 %   Neutro Abs 11.5 (H) 1.7 - 7.7 K/uL   Lymphocytes Relative 20 %   Lymphs Abs 3.2 0.7 - 4.0 K/uL   Monocytes Relative 5 %   Monocytes Absolute 0.8 0.1 - 1.0 K/uL   Eosinophils Relative 1 %   Eosinophils Absolute 0.2 0.0 - 0.5 K/uL   Basophils Relative 0 %   Basophils Absolute 0.1 0.0 - 0.1 K/uL   Immature Granulocytes 0 %   Abs Immature Granulocytes 0.07 0.00 - 0.07 K/uL  Protime-INR     Status:  None   Collection Time: 12/16/18  6:29 AM  Result Value Ref Range   Prothrombin Time 12.9 11.4 - 15.2 seconds   INR 1.0 0.8 - 1.2  APTT     Status: None   Collection Time: 12/16/18  6:29 AM  Result Value Ref Range   aPTT 26 24 - 36 seconds  Comprehensive metabolic panel     Status: Abnormal   Collection Time: 12/16/18  6:29 AM  Result Value Ref Range   Sodium 139 135 - 145 mmol/L   Potassium 3.4 (L) 3.5 - 5.1 mmol/L   Chloride 108 98 - 111 mmol/L   CO2 21 (L) 22 - 32 mmol/L   Glucose, Bld 174 (H) 70 - 99 mg/dL   BUN 10 6 - 20 mg/dL   Creatinine, Ser 0.83 0.44 - 1.00 mg/dL    Calcium 8.3 (L) 8.9 - 10.3 mg/dL   Total Protein 6.1 (L) 6.5 - 8.1 g/dL   Albumin 3.3 (L) 3.5 - 5.0 g/dL   AST 14 (L) 15 - 41 U/L   ALT 11 0 - 44 U/L   Alkaline Phosphatase 68 38 - 126 U/L   Total Bilirubin 0.8 0.3 - 1.2 mg/dL   GFR calc non Af Amer >60 >60 mL/min   GFR calc Af Amer >60 >60 mL/min   Anion gap 10 5 - 15  Troponin I - ONCE - STAT     Status: Abnormal   Collection Time: 12/16/18  6:29 AM  Result Value Ref Range   Troponin I 0.03 (HH) <0.03 ng/mL  Lipid panel     Status: Abnormal   Collection Time: 12/16/18  6:29 AM  Result Value Ref Range   Cholesterol 154 0 - 200 mg/dL   Triglycerides 74 <150 mg/dL   HDL 35 (L) >40 mg/dL   Total CHOL/HDL Ratio 4.4 RATIO   VLDL 15 0 - 40 mg/dL   LDL Cholesterol 104 (H) 0 - 99 mg/dL  TSH     Status: None   Collection Time: 12/16/18  6:29 AM  Result Value Ref Range   TSH 4.499 0.350 - 4.500 uIU/mL  Brain natriuretic peptide     Status: None   Collection Time: 12/16/18  6:29 AM  Result Value Ref Range   B Natriuretic Peptide 23.5 0.0 - 100.0 pg/mL  I-Stat beta hCG blood, ED     Status: None   Collection Time: 12/16/18  6:37 AM  Result Value Ref Range   I-stat hCG, quantitative <5.0 <5 mIU/mL   Comment 3          I-STAT 7, (LYTES, BLD GAS, ICA, H+H)     Status: Abnormal   Collection Time: 12/16/18  7:16 AM  Result Value Ref Range   pH, Arterial 7.224 (L) 7.350 - 7.450   pCO2 arterial 49.4 (H) 32.0 - 48.0 mmHg   pO2, Arterial 86.0 83.0 - 108.0 mmHg   Bicarbonate 20.4 20.0 - 28.0 mmol/L   TCO2 22 22 - 32 mmol/L   O2 Saturation 94.0 %   Acid-base deficit 7.0 (H) 0.0 - 2.0 mmol/L   Sodium 134 (L) 135 - 145 mmol/L   Potassium 3.2 (L) 3.5 - 5.1 mmol/L   Calcium, Ion 1.17 1.15 - 1.40 mmol/L   HCT 35.0 (L) 36.0 - 46.0 %   Hemoglobin 11.9 (L) 12.0 - 15.0 g/dL   Patient temperature HIDE    Sample type ARTERIAL   POCT Activated clotting time     Status: None  Collection Time: 12/16/18  7:49 AM  Result Value Ref Range    Activated Clotting Time 246 seconds  MRSA PCR Screening     Status: None   Collection Time: 12/16/18  8:17 AM  Result Value Ref Range   MRSA by PCR NEGATIVE NEGATIVE  Troponin I - Now Then Q6H     Status: Abnormal   Collection Time: 12/16/18 12:43 PM  Result Value Ref Range   Troponin I 9.79 (HH) <0.03 ng/mL   Dg Chest Portable 1 View  Result Date: 12/16/2018 CLINICAL DATA:  Chest pain.  Code STEMI. EXAM: PORTABLE CHEST 1 VIEW COMPARISON:  02/16/2016 FINDINGS: Examination is degraded due to patient body habitus and portable technique. Grossly unchanged cardiac silhouette and mediastinal contours given reduced lung volumes. Minimal perihilar heterogeneous opacities favored to represent atelectasis. No discrete focal airspace opacities. No pleural effusion or pneumothorax. No definite evidence of edema. No acute osseous abnormalities. IMPRESSION: 1. No definitive acute cardiopulmonary disease on this hypoventilated AP portable examination. 2. Perihilar opacities, favored to represent atelectasis. Electronically Signed   By: Sandi Mariscal M.D.   On: 12/16/2018 07:04     ASSESSMENT and Plan: NSTEMI: Patient had pLAD stenosis s/p PCI with DES. Per Dr. Tamala Julian,  Plan aspirin and Brilinta x12 months.  Careful early and subsequent follow-up as there is residual borderline significant ostial LAD disease that will be difficult to treat with percutaneous intervention without involving the left main.  Recurrent chest pain or difficulty during the hospital stay may reflect ischemia and should be carefully reevaluated.  High intensity statin therapy  Check hemoglobin A 1C  Beta-blocker therapy in the form of carvedilol 3.125 mg twice daily  IV nitroglycerin  Discharge 48 to 72 hours depending upon clinical course and whether there is recurrent chest discomfort.  Cardiac rehab upon discharge.

## 2018-12-16 NOTE — Progress Notes (Signed)
Chaplain responded to STEMI, Time of call 6:15 AM, patient 25 minutes out..Patient taken directly to Cath lab.  Chaplain provide ministry of presence to husband in [place].  Husband said wife was [employer], working as a Transport planner.  Will be available as needed. Tamsen Snider [phone]

## 2018-12-16 NOTE — Progress Notes (Signed)
Nausea, vomiting after protonix PO. Patient continues to complain of chest discomfort,  Verbalizes that shed is worried that it will accelerate like last night.  Will get another EKG  12 lead placed on o2 at 2L for comfort.

## 2018-12-16 NOTE — Progress Notes (Signed)
Continues to complain of nausea   Some anterior chest pain.  Medicated with morphine, increased NTG. Resting with eyes closed at times but still c/o same discomfort/ EKG  seen by cards. TR band off. No hematoma.

## 2018-12-16 NOTE — ED Provider Notes (Signed)
[name]   CSN: [id number] Arrival date & time: 12/16/18  0620    History   Chief Complaint Chief Complaint  [name] presents with  . Code STEMI    HPI Taylor [name] is a 53 y.o. female.   The history is provided by the [name] and the EMS personnel.  She denies any significant past history and comes in complaining of chest pressure which started at 11:30 PM and has persisted through the night.  Pressure is over the lower sternal area and radiates to the back.  There is associated dyspnea and nausea.  She is inconsistent about whether deep breath worsens the discomfort.  At home, she took approximately 6 nitroglycerin tablets.  EMS was called and have given her 7 nitroglycerin sprays with little to no relief.  They have also given her a total of 10 mg of morphine which is also given only slight, temporary relief.  She states she feels worse when she lays flat, better if she sits up.  She denies history of hypertension, diabetes, hyperlipidemia and denies tobacco use.  There is a strong family history of premature coronary atherosclerosis.  She also describes some exertional chest pain over the last 1-2 weeks but denies a crescendo pattern and denies nocturnal symptoms.  EMS called code STEMI based on ST elevation in lateral leads.  History reviewed. No pertinent past medical history.  [name]   Diagnosis Date Noted  . Encounter for IUD removal 05/16/2013    Class: Status post  . S/P tubal ligation 05/16/2013    Class: Status post  . IRREGULAR MENSES 09/29/2009    Past Surgical History:  Procedure Laterality Date  . CESAREAN SECTION    . IUD REMOVAL N/A 05/16/2013   Procedure: INTRAUTERINE DEVICE (IUD) REMOVAL;  Surgeon: Darlyn Chamber, MD;  Location: [facility];  Service: Gynecology;  Laterality: N/A;  . LAPAROSCOPIC TUBAL LIGATION Bilateral 05/16/2013   Procedure: LAPAROSCOPIC TUBAL LIGATION;  Surgeon: Darlyn Chamber, MD;  Location: [facility];  Service: Gynecology;  Laterality: Bilateral;     OB History   No obstetric history on file.      Home Medications    Prior to Admission medications   Medication Sig Start Date End Date Taking? Authorizing Provider  azithromycin (ZITHROMAX Z-PAK) 250 MG tablet Take 2 tabs today; then begin one tab once daily for 4 more days. (Rx void after 09/30/17) 09/22/17   Kandra Nicolas, MD  predniSONE (DELTASONE) 20 MG tablet Take one tab by mouth twice daily for 4 days, then one daily. Take with food. 09/22/17   Kandra Nicolas, MD    Family History Family History  Problem Relation Age of Onset  . Heart disease Other   . Colon cancer Other   . Heart attack Other   . Colon cancer Father     Social History Social History   Tobacco Use  . Smoking status: Never Smoker  . Smokeless tobacco: Never Used  Substance Use Topics  . Alcohol use: Yes    Comment:  once a month  . Drug use: No     Allergies   Tetracycline   Review of Systems Review of Systems  All other systems reviewed and are negative.    Physical Exam Updated Vital Signs BP 135/90   Pulse 72   Temp (!) 97.3 F (36.3 C) (Oral)   Resp 16   Wt 85.7 kg   SpO2  99%   BMI 38.17 kg/m   Physical Exam Vitals signs and nursing note reviewed.    53 year old female, moaning in obvious pain, but in no acute distress. Vital signs are normal. Oxygen saturation is 99%, which is normal. Head is normocephalic and atraumatic. PERRLA, EOMI. Oropharynx is clear. Neck is nontender and supple without adenopathy or JVD. Back is nontender and there is no CVA tenderness. Lungs are clear without rales, wheezes, or rhonchi. Chest is nontender. Heart has regular rate and rhythm without murmur. Abdomen is soft, flat, nontender without masses or hepatosplenomegaly and peristalsis is normoactive. Extremities have 1+ edema, full range of motion is present.  Dorsalis pedis pulses are strong and  symmetric. Skin is warm and dry without rash. Neurologic: Mental status is normal, cranial nerves are intact, there are no motor or sensory deficits.  ED Treatments / Results  Labs (all labs ordered are listed, but only abnormal results are displayed) Labs Reviewed  CBC WITH DIFFERENTIAL/PLATELET - Abnormal; Notable for the following components:      Result Value   WBC 15.8 (*)    Platelets 422 (*)    Neutro Abs 11.5 (*)    All other components within normal limits  COMPREHENSIVE METABOLIC PANEL - Abnormal; Notable for the following components:   Potassium 3.4 (*)    CO2 21 (*)    Glucose, Bld 174 (*)    Calcium 8.3 (*)    Total Protein 6.1 (*)    Albumin 3.3 (*)    AST 14 (*)    All other components within normal limits  TROPONIN I - Abnormal; Notable for the following components:   Troponin I 0.03 (*)    All other components within normal limits  LIPID PANEL - Abnormal; Notable for the following components:   HDL 35 (*)    LDL Cholesterol 104 (*)    All other components within normal limits  TROPONIN I - Abnormal; Notable for the following components:   Troponin I 9.79 (*)    All other components within normal limits  TROPONIN I - Abnormal; Notable for the following components:   Troponin I 10.92 (*)    All other components within normal limits  POCT I-STAT 7, (LYTES, BLD GAS, ICA,H+H) - Abnormal; Notable for the following components:   pH, Arterial 7.224 (*)    pCO2 arterial 49.4 (*)    Acid-base deficit 7.0 (*)    Sodium 134 (*)    Potassium 3.2 (*)    HCT 35.0 (*)    Hemoglobin 11.9 (*)    All other components within normal limits  MRSA PCR SCREENING  PROTIME-INR  APTT  TSH  BRAIN NATRIURETIC PEPTIDE  TROPONIN I  CBC  COMPREHENSIVE METABOLIC PANEL  HEMOGLOBIN A1C  I-STAT BETA HCG BLOOD, ED ([facility], [facility], [facility] ONLY)  POCT ACTIVATED CLOTTING TIME    EKG EKG Interpretation  Date/Time:  Sunday December 16 2018 06:27:01 EST Ventricular Rate:  86 PR Interval:      QRS Duration: 89 QT Interval:  392 QTC Calculation: 469 R Axis:   43 Text Interpretation:  Sinus rhythm Low voltage, precordial leads ST elev, probable normal early repol pattern When compared with ECG of 02/06/2014, HEART RATE has decreased Confirmed by Delora Fuel ([id number]) on 12/16/2018 6:55:59 AM   EKG Interpretation  Date/Time:  Sunday December 16 2018 06:34:11 EST Ventricular Rate:  89 PR Interval:    QRS Duration: 87 QT Interval:  363 QTC Calculation: 442 R Axis:  62 Text Interpretation:  Sinus rhythm Low voltage, precordial leads Repol abnrm suggests ischemia, diffuse leads Minimal ST elevation, lateral leads Baseline wander in lead(s) V3 V4 V5 When compared with ECG of EARLIER SAME DATE ST-t abnormality is now present Inferior leads , Anterolateral leads Confirmed by Delora Fuel ([id number]) on 12/16/2018 7:00:59 AM       Radiology Dg Chest Portable 1 View  Result Date: 12/16/2018 CLINICAL DATA:  Chest pain.  Code STEMI. EXAM: PORTABLE CHEST 1 VIEW COMPARISON:  02/16/2016 FINDINGS: Examination is degraded due to [name] body habitus and portable technique. Grossly unchanged cardiac silhouette and mediastinal contours given reduced lung volumes. Minimal perihilar heterogeneous opacities favored to represent atelectasis. No discrete focal airspace opacities. No pleural effusion or pneumothorax. No definite evidence of edema. No acute osseous abnormalities. IMPRESSION: 1. No definitive acute cardiopulmonary disease on this hypoventilated AP portable examination. 2. Perihilar opacities, favored to represent atelectasis. Electronically Signed   By: Sandi Mariscal M.D.   On: 12/16/2018 07:04    Procedures Procedures  CRITICAL CARE Performed by: Delora Fuel Total critical care time: 45 minutes Critical care time was exclusive of separately billable procedures and treating other patients. Critical care was necessary to treat or prevent imminent or life-threatening deterioration. Critical care was  time spent personally by me on the following activities: development of treatment plan with [name] and/or surrogate as well as nursing, discussions with consultants, evaluation of [name]'s response to treatment, examination of [name], obtaining history from [name] or surrogate, ordering and performing treatments and interventions, ordering and review of laboratory studies, ordering and review of radiographic studies, pulse oximetry and re-evaluation of [name]'s condition.  Medications Ordered in ED Medications  0.9 %  sodium chloride infusion (has no administration in time range)  heparin injection 4,000 Units ( Intravenous MAR Hold 12/16/18 0651)  fentaNYL (SUBLIMAZE) injection 50 mcg (50 mcg Intravenous Given 12/16/18 0637)  midazolam (VERSED) injection 1 mg (1 mg Intravenous Given 12/16/18 4656)     Initial Impression / Assessment and Plan / ED Course  I have reviewed the triage vital signs and the nursing notes.  Pertinent labs & imaging results that were available during my care of the [name] were reviewed by me and considered in my medical decision making (see chart for details).  Chest discomfort very worrisome for ACS.  ECG does not meet STEMI criteria.  [name] was noted to be much more comfortable sitting up.  This was worrisome for possible pericarditis, but no rub was heard.  Dr. Tamala Julian of cardiology service has come to assist in evaluation.  It was noted that when [name] laid down, ECG changes were present and ECG did document significant ST-T changes with the [name] supine.  She was given additional fentanyl and midazolam in the ED and seemed to feel much better.  ECG was repeated and ST-T changes had resolved.  However, [name] did become hypoxic and required nasal oxygen with eventual correction of hypoxia.  Portable chest x-ray did not show any obvious abnormalities.  Although ECG did not meet STEMI criteria, the likelihood of ACS was felt to be high enough that she was taken  directly to catheterization lab.  Also, because of uncertainty of diagnosis with diagnostic possibilities including things that would get worse with anticoagulation (aortic dissection, coronary artery dissection) , decision was made to hold on heparin until findings in the Cath Lab confirmed a process where anticoagulation would be needed.  Old records are reviewed, and she does have a prior ED visit  for chest pain in 2015.  Final Clinical Impressions(s) / ED Diagnoses   Final diagnoses:  ACS (acute coronary syndrome) [facility])    ED Discharge Orders    None       Delora Fuel, MD 32/00/37 [id number]

## 2018-12-17 ENCOUNTER — Inpatient Hospital Stay (HOSPITAL_COMMUNITY): Payer: 59

## 2018-12-17 ENCOUNTER — Encounter (HOSPITAL_COMMUNITY): Payer: Self-pay | Admitting: Interventional Cardiology

## 2018-12-17 DIAGNOSIS — Z9861 Coronary angioplasty status: Secondary | ICD-10-CM

## 2018-12-17 DIAGNOSIS — I214 Non-ST elevation (NSTEMI) myocardial infarction: Secondary | ICD-10-CM

## 2018-12-17 DIAGNOSIS — I5041 Acute combined systolic (congestive) and diastolic (congestive) heart failure: Secondary | ICD-10-CM

## 2018-12-17 DIAGNOSIS — E785 Hyperlipidemia, unspecified: Secondary | ICD-10-CM

## 2018-12-17 DIAGNOSIS — I255 Ischemic cardiomyopathy: Secondary | ICD-10-CM | POA: Diagnosis present

## 2018-12-17 LAB — COMPREHENSIVE METABOLIC PANEL
ALT: 16 U/L (ref 0–44)
AST: 70 U/L — ABNORMAL HIGH (ref 15–41)
Albumin: 2.9 g/dL — ABNORMAL LOW (ref 3.5–5.0)
Alkaline Phosphatase: 58 U/L (ref 38–126)
Anion gap: 9 (ref 5–15)
BUN: 6 mg/dL (ref 6–20)
CO2: 19 mmol/L — ABNORMAL LOW (ref 22–32)
Calcium: 8.2 mg/dL — ABNORMAL LOW (ref 8.9–10.3)
Chloride: 109 mmol/L (ref 98–111)
Creatinine, Ser: 0.69 mg/dL (ref 0.44–1.00)
GFR calc Af Amer: >60 mL/min (ref >60)
GFR calc non Af Amer: >60 mL/min (ref >60)
Glucose, Bld: 111 mg/dL — ABNORMAL HIGH (ref 70–99)
Potassium: 4.1 mmol/L (ref 3.5–5.1)
Sodium: 137 mmol/L (ref 135–145)
Total Bilirubin: 1.4 mg/dL — ABNORMAL HIGH (ref 0.3–1.2)
Total Protein: 5.3 g/dL — ABNORMAL LOW (ref 6.5–8.1)

## 2018-12-17 LAB — CBC
HCT: 34.1 % — ABNORMAL LOW (ref 36.0–46.0)
Hemoglobin: 11.1 g/dL — ABNORMAL LOW (ref 12.0–15.0)
MCH: 30.4 pg (ref 26.0–34.0)
MCHC: 32.6 g/dL (ref 30.0–36.0)
MCV: 93.4 fL (ref 80.0–100.0)
Platelets: 315 10*3/uL (ref 150–400)
RBC: 3.65 MIL/uL — ABNORMAL LOW (ref 3.87–5.11)
RDW: 13.2 % (ref 11.5–15.5)
WBC: 16.5 10*3/uL — ABNORMAL HIGH (ref 4.0–10.5)
nRBC: 0 % (ref 0.0–0.2)

## 2018-12-17 LAB — HEMOGLOBIN A1C
Hgb A1c MFr Bld: 5.4 % (ref 4.8–5.6)
Mean Plasma Glucose: 108.28 mg/dL

## 2018-12-17 LAB — TROPONIN I: Troponin I: 5.66 ng/mL (ref <0.03)

## 2018-12-17 LAB — POCT I-STAT CREATININE: Creatinine, Ser: 0.5 mg/dL (ref 0.44–1.00)

## 2018-12-17 LAB — POCT ACTIVATED CLOTTING TIME
Activated Clotting Time: 241 seconds
Activated Clotting Time: 406 seconds

## 2018-12-17 LAB — ECHOCARDIOGRAM COMPLETE: Weight: 3024 oz

## 2018-12-17 MED ORDER — ISOSORBIDE MONONITRATE ER 30 MG PO TB24
30.0000 mg | ORAL_TABLET | Freq: Every day | ORAL | Status: DC
  Administered 2018-12-17 – 2018-12-18 (×2): 30 mg via ORAL
  Filled 2018-12-17 (×2): qty 1

## 2018-12-17 MED ORDER — LOSARTAN POTASSIUM 25 MG PO TABS
12.5000 mg | ORAL_TABLET | Freq: Every day | ORAL | Status: DC
  Administered 2018-12-17: 12.5 mg via ORAL
  Filled 2018-12-17: qty 1

## 2018-12-17 NOTE — Progress Notes (Signed)
Patient transferred to [phone] via w/c. Family with patient.

## 2018-12-17 NOTE — Care Management (Signed)
Brilinta benefits check sent and pending.  Regine Christian RN, BSN, NCM-BC, ACM-RN [phone] 

## 2018-12-17 NOTE — Care Management (Signed)
#  4.   S/W JASMINE @ [employer]  # [phone]   TICAGRELOR : NON-FORMULARY  BRILINTA  90 MG BID COVER- YES CO-PAY- $ 93.38  Q/L TWO PILL PER DAY TIER- NO PRIOR APPROVAL- NO  NO DEDUCTIBLE OUT-OF-POCKET: NOT MET  PREFERRED PHARMACY  : YES [facility] [place] OUTPATIENT [facility] 90 DAY SUPPLY FOR [place]  OUTPT $ 200.00  

## 2018-12-17 NOTE — Progress Notes (Signed)
  Echocardiogram 2D Echocardiogram has been performed.  [name] 12/17/2018, 4:51 PM

## 2018-12-17 NOTE — Progress Notes (Signed)
Progress Note  Patient Name: [name] Date of Encounter: 12/17/2018  Primary Cardiologist: Sinclair Grooms, MD   Patient Profile     53 y.o. female with no prior cardiac medical history who presented with acute a anterior chest pain/ACS on the morning of 12/16/2018.  Was taken directly to cardiac catheterization lab for unrelenting pain and elevated troponin.  There is also dynamic EKG changes.  She was found to have 99% pLAD stenosis treated with DES PCI.  Post stenting there did appear to be some ostial disease in the LAD upstream of the stent that was not initially seen (suspect that this is more step up to stent than true stenosis)  Subjective   Definitely had pain yesterday, but once she was able to lay down to go to sleep, symptoms somewhat abated and she felt better.  No more pain today.  Has been up to the bathroom and had a bath.  Feels much better.  No more chest pain or dyspnea today. Has yet to walk.  Inpatient Medications    Scheduled Meds: . alum & mag hydroxide-simeth  30 mL Oral Once  . aspirin  81 mg Oral Daily  . atorvastatin  80 mg Oral q1800  . carvedilol  3.125 mg Oral BID WC  . heparin  5,000 Units Subcutaneous Q8H  . losartan  12.5 mg Oral QHS  . pantoprazole  40 mg Oral Daily  . sodium chloride flush  3 mL Intravenous Q12H  . ticagrelor  90 mg Oral BID   Continuous Infusions: . sodium chloride     PRN Meds: sodium chloride, acetaminophen, ALPRAZolam, ondansetron (ZOFRAN) IV, oxyCODONE, sodium chloride flush   Vital Signs    Vitals:   12/17/18 0300 12/17/18 0400 12/17/18 0500 12/17/18 0600  BP: 112/67  104/68 109/67  Pulse: 73 83 72 72  Resp: 17 18 17 14   Temp:  98.6 F (37 C)    TempSrc:  Oral    SpO2: 95% 99% 96% 96%  Weight:        Intake/Output Summary (Last 24 hours) at 12/17/2018 1000 Last data filed at 12/17/2018 0600 Gross per 24 hour  Intake 1175.23 ml  Output 850 ml  Net 325.23 ml   Last 3 Weights 12/16/2018 09/22/2017  12/20/2016  Weight (lbs) 189 lb 189 lb 193 lb  Weight (kg) 85.73 kg 85.73 kg 87.544 kg      Telemetry    Sinus rhythm and sinus tachycardia.  Short little bursts of 3-8 beats nonsustained VT- Personally Reviewed  ECG    Sinus rhythm, rate 85 bpm.  Anterior ST and T wave changes consistent with evolutionary changes of anterior MI (biphasic ST-T waves with T wave inversions).- Personally Reviewed  Physical Exam   Physical Exam  Constitutional: She is oriented to person, place, and time. She appears well-developed and well-nourished. No distress.  HENT:  Head: Normocephalic and atraumatic.  Neck: Normal range of motion. Neck supple. No JVD present.  Cardiovascular: Normal rate, regular rhythm, normal heart sounds and intact distal pulses.  No extrasystoles are present. PMI is not displaced. Exam reveals no gallop and no friction rub.  No murmur heard. Pulmonary/Chest: Effort normal and breath sounds normal. No respiratory distress. She has no wheezes. She has no rales.  Abdominal: Soft. Bowel sounds are normal. She exhibits no distension. There is no abdominal tenderness.  Musculoskeletal: Normal range of motion.        General: No deformity or edema.  Comments: Radial cath site clean dry and intact.  No hematoma.  Only has a little discomfort at the [facility] IV site  Neurological: She is alert and oriented to person, place, and time.  Skin: Skin is warm and dry.  Psychiatric: She has a normal mood and affect. Her behavior is normal. Judgment and thought content normal.  Nursing note and vitals reviewed.   Labs    Chemistry Recent Labs  Lab 12/16/18 0629 12/16/18 0716 12/17/18 0109  NA 139 134* 137  K 3.4* 3.2* 4.1  CL 108  --  109  CO2 21*  --  19*  GLUCOSE 174*  --  111*  BUN 10  --  6  CREATININE 0.83  --  0.69  CALCIUM 8.3*  --  8.2*  PROT 6.1*  --  5.3*  ALBUMIN 3.3*  --  2.9*  AST 14*  --  70*  ALT 11  --  16  ALKPHOS 68  --  58  BILITOT 0.8  --  1.4*  GFRNONAA  >60  --  >60  GFRAA >60  --  >60  ANIONGAP 10  --  9     Hematology Recent Labs  Lab 12/16/18 0629 12/16/18 0716 12/17/18 0109  WBC 15.8*  --  16.5*  RBC 4.23  --  3.65*  HGB 12.5 11.9* 11.1*  HCT 39.6 35.0* 34.1*  MCV 93.6  --  93.4  MCH 29.6  --  30.4  MCHC 31.6  --  32.6  RDW 12.8  --  13.2  PLT 422*  --  315    Cardiac Enzymes Recent Labs  Lab 12/16/18 0629 12/16/18 1243 12/16/18 1825 12/17/18 0109  TROPONINI 0.03* 9.79* 10.92* 5.66*   No results for input(s): TROPIPOC in the last 168 hours.   BNP Recent Labs  Lab 12/16/18 0629  BNP 23.5     DDimer No results for input(s): DDIMER in the last 168 hours.   Radiology    Dg Chest Portable 1 View  Result Date: 12/16/2018 CLINICAL DATA:  Chest pain.  Code STEMI. EXAM: PORTABLE CHEST 1 VIEW COMPARISON:  02/16/2016 FINDINGS: Examination is degraded due to patient body habitus and portable technique. Grossly unchanged cardiac silhouette and mediastinal contours given reduced lung volumes. Minimal perihilar heterogeneous opacities favored to represent atelectasis. No discrete focal airspace opacities. No pleural effusion or pneumothorax. No definite evidence of edema. No acute osseous abnormalities. IMPRESSION: 1. No definitive acute cardiopulmonary disease on this hypoventilated AP portable examination. 2. Perihilar opacities, favored to represent atelectasis. Electronically Signed   By: Sandi Mariscal M.D.   On: 12/16/2018 07:04    Cardiac Studies   Cardiac Cath-PCI 12/16/2018: 99% eccentric/dynamic stenosis in the pLAD (PCI with SYNERGY DES 3.0 mm x 15 mm --> 3.25 mm), question upstream residual 60 to 70% stenosis (may be simply related to stent step up).  Otherwise relatively normal CAD.  EF 35% with EDP 30 (consistent with ACUTE COMBINED SYSTOLIC AND DIASTOLIC HEART FAILURE) --On my review the images, the more ostial LAD stenosis appears to simply be step up to the stent.  It did mostly resolve with IC  nitroglycerin.  Diagnostic       Intervention      Assessment & Plan    Principal Problem:   Acute non-ST elevation myocardial infarction (NSTEMI) (HCC) Active Problems:   CAD S/P DES PCI -pLAD   Ischemic cardiomyopathy -in setting of anterior non-STEMI   Acute combined systolic and diastolic heart failure ([place])  Hyperlipidemia with target LDL less than 70   Acute non-STEMI with LAD PCI using DES stent, ischemic cardiomyopathy/ Acute combined systolic and diastolic heart failure::  On aspirin plus Brilinta  Hyperlipidemia: On high-dose statin  Blood pressure stable on low-dose beta-blocker, but could not tolerate low-dose ARB given reduced EF.  Echo pending, will need better assessment of EF  Relatively euvolemic.  Hold off on diuretic.  Residual ostial LAD disease --on my review this area did not seem to be a true stenosis, more consistent with Up to stent that resolved mostly with nitroglycerin. -->  If recurrent pain, would potentially consider long-acting nitrate, but if unable to control would then probably require consideration for either ostial LAD stent or LIMA-LAD CABG   I suspect some of the chest pain she had yesterday could very well have been related to post PCI spasm, but just to be sure we will start low-dose Imdur 30 mg  Was mildly hypokalemic yesterday, now resolved with supplementation.  Dispo: Plan for today will be to await results of echocardiogram and to see how she does ambulating with cardiac rehab.  If stable, transfer to telemetry today and potentially discharge tomorrow.   For questions or updates, please contact [place] Please consult [email or web address] for contact info under        Signed, Glenetta Hew, MD  12/17/2018, 10:00 AM

## 2018-12-17 NOTE — Progress Notes (Signed)
CARDIAC REHAB PHASE I   PRE:  Rate/Rhythm: 87 SR  BP:  Sitting: 114/66      SaO2: 99 RA  MODE:  Ambulation: 370 ft   POST:  Rate/Rhythm: 96 SR  BP:  Sitting: 138/73    SaO2: 99 RA   Pt ambulated 351ft in hallway standby assist with slow steady gait. Pt took one standing rest break due to SOB. Pt coached through purse lipped breathing. Began MI education with pt. Pt given MI book, and stent card. Pt and family educated on importance of ASA, Brilinta, statin, and NTG. Pt for echo today, will await echo results to do any further education. Will continue to follow  [phone] Rufina Falco, RN BSN 12/17/2018 11:07 AM

## 2018-12-18 ENCOUNTER — Telehealth: Payer: Self-pay | Admitting: Interventional Cardiology

## 2018-12-18 LAB — BASIC METABOLIC PANEL
Anion gap: 8 (ref 5–15)
BUN: 8 mg/dL (ref 6–20)
CO2: 24 mmol/L (ref 22–32)
Calcium: 9 mg/dL (ref 8.9–10.3)
Chloride: 108 mmol/L (ref 98–111)
Creatinine, Ser: 0.79 mg/dL (ref 0.44–1.00)
GFR calc Af Amer: >60 mL/min (ref >60)
GFR calc non Af Amer: >60 mL/min (ref >60)
Glucose, Bld: 103 mg/dL — ABNORMAL HIGH (ref 70–99)
Potassium: 3.7 mmol/L (ref 3.5–5.1)
Sodium: 140 mmol/L (ref 135–145)

## 2018-12-18 MED ORDER — ISOSORBIDE MONONITRATE ER 30 MG PO TB24: 30.0000 mg | ORAL_TABLET | Freq: Every day | ORAL | 0 refills | Status: DC

## 2018-12-18 MED ORDER — CARVEDILOL 6.25 MG PO TABS: 6.2500 mg | ORAL_TABLET | Freq: Two times a day (BID) | ORAL | Status: DC

## 2018-12-18 MED ORDER — NITROGLYCERIN 0.4 MG SL SUBL: 0.4000 mg | SUBLINGUAL_TABLET | SUBLINGUAL | Status: DC | PRN

## 2018-12-18 MED ORDER — TICAGRELOR 90 MG PO TABS: 90.0000 mg | ORAL_TABLET | Freq: Two times a day (BID) | ORAL | 0 refills | Status: DC

## 2018-12-18 MED ORDER — ATORVASTATIN CALCIUM 80 MG PO TABS: 80.0000 mg | ORAL_TABLET | Freq: Every day | ORAL | 0 refills | Status: DC

## 2018-12-18 MED ORDER — NITROGLYCERIN 0.4 MG SL SUBL: 0.4000 mg | SUBLINGUAL_TABLET | SUBLINGUAL | 0 refills | Status: DC | PRN

## 2018-12-18 MED ORDER — LOSARTAN POTASSIUM 25 MG PO TABS: 12.5000 mg | ORAL_TABLET | Freq: Every day | ORAL | 0 refills | Status: DC

## 2018-12-18 MED ORDER — ASPIRIN 81 MG PO CHEW: 81.0000 mg | CHEWABLE_TABLET | Freq: Every day | ORAL | 0 refills | Status: AC

## 2018-12-18 MED ORDER — CARVEDILOL 6.25 MG PO TABS: 6.2500 mg | ORAL_TABLET | Freq: Two times a day (BID) | ORAL | 0 refills | Status: DC

## 2018-12-18 MED FILL — LOSARTAN POTASSIUM 25 MG TA: 25 | 30 days supply | Qty: 15 | Fill #0

## 2018-12-18 MED FILL — ASPIRIN LOW DOSE 81 MG CHEW: 81 | 30 days supply | Qty: 30 | Fill #0

## 2018-12-18 MED FILL — CARVEDILOL 6.25 MG TABLET: 6.25 | 30 days supply | Qty: 60 | Fill #0

## 2018-12-18 MED FILL — BRILINTA 90 MG TABLET: 90 | 30 days supply | Qty: 60 | Fill #0

## 2018-12-18 MED FILL — NITROGLYCERIN 0.4 MG TAB SL: 0.4 | 8 days supply | Qty: 25 | Fill #0

## 2018-12-18 MED FILL — ISOSORBIDE MN ER 30 MG TAB: 30 | 30 days supply | Qty: 30 | Fill #0

## 2018-12-18 MED FILL — ATORVASTATIN CALCIUM 80 MG: 80 | 30 days supply | Qty: 30 | Fill #0

## 2018-12-18 NOTE — Discharge Instructions (Signed)
Radial Site Care ° °This sheet gives you information about how to care for yourself after your procedure. Your health care provider may also give you more specific instructions. If you have problems or questions, contact your health care provider. °What can I expect after the procedure? °After the procedure, it is common to have: °· Bruising and tenderness at the catheter insertion area. °Follow these instructions at home: °Medicines °· Take over-the-counter and prescription medicines only as told by your health care provider. °Insertion site care °· Follow instructions from your health care provider about how to take care of your insertion site. Make sure you: °? Wash your hands with soap and water before you change your bandage (dressing). If soap and water are not available, use hand sanitizer. °? Change your dressing as told by your health care provider. °? Leave stitches (sutures), skin glue, or adhesive strips in place. These skin closures may need to stay in place for 2 weeks or longer. If adhesive strip edges start to loosen and curl up, you may trim the loose edges. Do not remove adhesive strips completely unless your health care provider tells you to do that. °· Check your insertion site every day for signs of infection. Check for: °? Redness, swelling, or pain. °? Fluid or blood. °? Pus or a bad smell. °? Warmth. °· Do not take baths, swim, or use a hot tub until your health care provider approves. °· You may shower 24-48 hours after the procedure, or as directed by your health care provider. °? Remove the dressing and gently wash the site with plain soap and water. °? Pat the area dry with a clean towel. °? Do not rub the site. That could cause bleeding. °· Do not apply powder or lotion to the site. °Activity ° °· For 24 hours after the procedure, or as directed by your health care provider: °? Do not flex or bend the affected arm. °? Do not push or pull heavy objects with the affected arm. °? Do not  drive yourself home from the hospital or clinic. You may drive 24 hours after the procedure unless your health care provider tells you not to. °? Do not operate machinery or power tools. °· Do not lift anything that is heavier than 10 lb (4.5 kg), or the limit that you are told, until your health care provider says that it is safe. °· Ask your health care provider when it is okay to: °? Return to work or school. °? Resume usual physical activities or sports. °? Resume sexual activity. °General instructions °· If the catheter site starts to bleed, raise your arm and put firm pressure on the site. If the bleeding does not stop, get help right away. This is a medical emergency. °· If you went home on the same day as your procedure, a responsible adult should be with you for the first 24 hours after you arrive home. °· Keep all follow-up visits as told by your health care provider. This is important. °Contact a health care provider if: °· You have a fever. °· You have redness, swelling, or yellow drainage around your insertion site. °Get help right away if: °· You have unusual pain at the radial site. °· The catheter insertion area swells very fast. °· The insertion area is bleeding, and the bleeding does not stop when you hold steady pressure on the area. °· Your arm or hand becomes pale, cool, tingly, or numb. °These symptoms may represent a serious problem   that is an emergency. Do not wait to see if the symptoms will go away. Get medical help right away. Call your local emergency services (911 in the [place] Do not drive yourself to the hospital. °Summary °· After the procedure, it is common to have bruising and tenderness at the site. °· Follow instructions from your health care provider about how to take care of your radial site wound. Check the wound every day for signs of infection. °· Do not lift anything that is heavier than 10 lb (4.5 kg), or the limit that you are told, until your health care provider says  that it is safe. °This information is not intended to replace advice given to you by your health care provider. Make sure you discuss any questions you have with your health care provider. °Document Released: 11/05/2010 Document Revised: 11/08/2017 Document Reviewed: 11/08/2017 °Elsevier Interactive Patient Education © 2019 [employer] ° °

## 2018-12-18 NOTE — Telephone Encounter (Signed)
New Message   Patient has a TOC appt scheduled 03/17 with Truitt Merle.

## 2018-12-18 NOTE — Telephone Encounter (Signed)
**Note De-identified [name] Obfuscation** The pt is being discharged from the hospital today. We will call tomorrow. 

## 2018-12-18 NOTE — Discharge Summary (Addendum)
Discharge Summary    [name],  MRN: [id number], DOB/AGE: Apr 23, 1966 53 y.o.  Admit date: 12/16/2018 Discharge date: 12/18/2018  Primary Care Provider: [name] Primary Cardiologist: Dr. Tamala Julian   Discharge Diagnoses    Principal Problem:   Acute non-ST elevation myocardial infarction (NSTEMI) [facility]) Active Problems:   CAD S/P DES PCI -pLAD   Ischemic cardiomyopathy -in setting of anterior non-STEMI   Acute combined systolic and diastolic heart failure ([place])   Hyperlipidemia with target LDL less than 70   Morbid obesity (HCC)   Allergies Allergies  Allergen Reactions  . Tetracycline Rash    Diagnostic Studies/Procedures    Cath: 12/16/2018   Anterior non-ST elevation MI with initial angiography demonstrating 99% dynamic stenosis in the proximal LAD with TIMI grade II flow.  Successful proximal LAD synergy 3.0 x 15 DES postdilated to 3.25 mm reducing the stenosis to 0% with TIMI grade III flow.  Post stent deployment and orthogonal view in the LAO caudal demonstrated ostial eccentric 60 to 70% LAD stenosis.  This lesion is unrecognizable in the RAO and cranial views.  Normal left main  Normal circumflex  Normal RCA  Anteroapical akinesis with EF 35%.  LVEDP 30 mmHg.  RECOMMENDATIONS:   Plan aspirin and Brilinta x12 months.  Careful early and subsequent follow-up as there is residual borderline significant ostial LAD disease that will be difficult to treat with percutaneous intervention without involving the left main.  Recurrent chest pain or difficulty during the hospital stay may reflect ischemia and should be carefully reevaluated.  High intensity statin therapy  Check hemoglobin A 1C  Beta-blocker therapy in the form of carvedilol 3.125 mg twice daily  IV nitroglycerin  Discharge 48 to 72 hours depending upon clinical course and whether there is recurrent chest discomfort.  TTE: 12/17/2018  IMPRESSIONS    1. The left  ventricle has normal systolic function, with an ejection fraction of 55-60%. The cavity size was normal. Mild basal septal hypertrophy. Left ventricular diastolic parameters were normal.  2. The mitral valve is normal in structure.  3. The tricuspid valve is normal in structure.  4. The aortic valve is tricuspid.  5. The pulmonic valve was normal in structure. _____________   History of Present Illness     53 yr old with no significant past medical history admitted with acute onset of severe chest pain. Started at 11:30 pm the day prior to admission and she took ASA which did not relieve it. Was associated with sweating. Finally decided to go to the ED because of unrelenting pain where she was not able to lie down. Was taken to the cath lab because of dynamic EKG changes and found to have 99% pLAD stenosis s/p PCI with 3.0 x15 DES.   Hospital Course     Placed on DAPT with ASA/Brilinta post cath. Troponin peaked at 10.92. LDL at 104, placed on high dose statin. Appearred euvolemic post cath, and did not require diuretics. Started on low dose BB but could not tolerate adding ARB. Had some recurrent chest pain and placed on Imdur 30mg  with improvement. EF on LV gram noted at 35%, but follow up echo with 55-60% with mild basal septal hypertrophy. Worked well with cardiac rehab.   General: Well developed, well nourished, female appearing in no acute distress. Head: Normocephalic, atraumatic.  Neck: Supple without bruits, JVD. Lungs:  Resp regular and unlabored, CTA. Heart: RRR, S1, S2, no S3, S4, or murmur; no rub. Abdomen:  Soft, non-tender, non-distended with normoactive bowel sounds. No hepatomegaly. No rebound/guarding. No obvious abdominal masses. Extremities: No clubbing, cyanosis, edema. Distal pedal pulses are 2+ bilaterally. Right radial cath site stable without bruising or hematoma Neuro: Alert and oriented X 3. Moves all extremities spontaneously. Psych: Normal affect.  [name]  was seen by Dr. Ellyn Hack and determined stable for discharge home. Follow up in the office has been arranged. Medications are listed below.   _____________  Discharge Vitals Blood pressure 129/89, pulse 74, temperature 98 F (36.7 C), temperature source Oral, resp. rate (!) 22, height 4\' 11"  (1.499 m), weight 84.7 kg, SpO2 99 %.  Filed Weights   12/16/18 0624 12/17/18 1805 12/18/18 0447  Weight: 85.7 kg 86 kg 84.7 kg    Labs & Radiologic Studies    CBC Recent Labs    12/16/18 0629 12/16/18 0716 12/17/18 0109  WBC 15.8*  --  16.5*  NEUTROABS 11.5*  --   --   HGB 12.5 11.9* 11.1*  HCT 39.6 35.0* 34.1*  MCV 93.6  --  93.4  PLT 422*  --  983   Basic Metabolic Panel Recent Labs    12/17/18 0109 12/18/18 0353  NA 137 140  K 4.1 3.7  CL 109 108  CO2 19* 24  GLUCOSE 111* 103*  BUN 6 8  CREATININE 0.69 0.79  CALCIUM 8.2* 9.0   Liver Function Tests Recent Labs    12/16/18 0629 12/17/18 0109  AST 14* 70*  ALT 11 16  ALKPHOS 68 58  BILITOT 0.8 1.4*  PROT 6.1* 5.3*  ALBUMIN 3.3* 2.9*   No results for input(s): LIPASE, AMYLASE in the last 72 hours. Cardiac Enzymes Recent Labs    12/16/18 1243 12/16/18 1825 12/17/18 0109  TROPONINI 9.79* 10.92* 5.66*   BNP Invalid input(s): POCBNP D-Dimer No results for input(s): DDIMER in the last 72 hours. Hemoglobin A1C Recent Labs    12/17/18 0109  HGBA1C 5.4   Fasting Lipid Panel Recent Labs    12/16/18 0629  CHOL 154  HDL 35*  LDLCALC 104*  TRIG 74  CHOLHDL 4.4   Thyroid Function Tests Recent Labs    12/16/18 0629  TSH 4.499   _____________  Dg Chest Portable 1 View  Result Date: 12/16/2018 CLINICAL DATA:  Chest pain.  Code STEMI. EXAM: PORTABLE CHEST 1 VIEW COMPARISON:  02/16/2016 FINDINGS: Examination is degraded due to patient body habitus and portable technique. Grossly unchanged cardiac silhouette and mediastinal contours given reduced lung volumes. Minimal perihilar heterogeneous opacities favored  to represent atelectasis. No discrete focal airspace opacities. No pleural effusion or pneumothorax. No definite evidence of edema. No acute osseous abnormalities. IMPRESSION: 1. No definitive acute cardiopulmonary disease on this hypoventilated AP portable examination. 2. Perihilar opacities, favored to represent atelectasis. Electronically Signed   By: Sandi Mariscal M.D.   On: 12/16/2018 07:04   Disposition   Pt is being discharged home today in good condition.  Follow-up Plans & Appointments    Follow-up Information    Burtis Junes, NP Follow up on 01/01/2019.   Specialties:  Nurse Practitioner, Interventional Cardiology, Cardiology, Radiology Why:  at 11:30am for your follow up appt.  Contact information: [place] [place] [address] [phone]          Discharge Instructions    Amb Referral to Cardiac Rehabilitation   Complete by:  As directed    Diagnosis:  Coronary Stents   Diet - low sodium heart healthy  Complete by:  As directed    Discharge instructions   Complete by:  As directed    Radial Site Care Refer to this sheet in the next few weeks. These instructions provide you with information on caring for yourself after your procedure. Your caregiver may also give you more specific instructions. Your treatment has been planned according to current medical practices, but problems sometimes occur. Call your caregiver if you have any problems or questions after your procedure. HOME CARE INSTRUCTIONS You may shower the day after the procedure.Remove the bandage (dressing) and gently wash the site with plain soap and water.Gently pat the site dry.  Do not apply powder or lotion to the site.  Do not submerge the affected site in water for 3 to 5 days.  Inspect the site at least twice daily.  Do not flex or bend the affected arm for 24 hours.  No lifting over 5 pounds (2.3 kg) for 5 days after your procedure.  Do not drive home if you are  discharged the same day of the procedure. Have someone else drive you.  You may drive 24 hours after the procedure unless otherwise instructed by your caregiver.  What to expect: Any bruising will usually fade within 1 to 2 weeks.  Blood that collects in the tissue (hematoma) may be painful to the touch. It should usually decrease in size and tenderness within 1 to 2 weeks.  SEEK IMMEDIATE MEDICAL CARE IF: You have unusual pain at the radial site.  You have redness, warmth, swelling, or pain at the radial site.  You have drainage (other than a small amount of blood on the dressing).  You have chills.  You have a fever or persistent symptoms for more than 72 hours.  You have a fever and your symptoms suddenly get worse.  Your arm becomes pale, cool, tingly, or numb.  You have heavy bleeding from the site. Hold pressure on the site.   PLEASE DO NOT MISS ANY DOSES OF YOUR BRILINTA!!!!! Also keep a log of you blood pressures and bring back to your follow up appt. Please call the office with any questions.   Patients taking blood thinners should generally stay away from medicines like ibuprofen, Advil, Motrin, naproxen, and Aleve due to risk of stomach bleeding. You may take Tylenol as directed or talk to your primary doctor about alternatives.   Increase activity slowly   Complete by:  As directed       Discharge Medications     Medication List    TAKE these medications   aspirin 81 MG chewable tablet Chew 1 tablet (81 mg total) by mouth daily. Start taking on:  December 19, 2018   atorvastatin 80 MG tablet Commonly known as:  LIPITOR Take 1 tablet (80 mg total) by mouth daily at 6 PM.   carvedilol 6.25 MG tablet Commonly known as:  COREG Take 1 tablet (6.25 mg total) by mouth 2 (two) times daily with a meal.   isosorbide mononitrate 30 MG 24 hr tablet Commonly known as:  IMDUR Take 1 tablet (30 mg total) by mouth daily. Start taking on:  December 19, 2018   losartan 25 MG  tablet Commonly known as:  COZAAR Take 0.5 tablets (12.5 mg total) by mouth daily.   nitroGLYCERIN 0.4 MG SL tablet Commonly known as:  NITROSTAT Place 1 tablet (0.4 mg total) under the tongue every 5 (five) minutes as needed for chest pain.   ticagrelor 90 MG Tabs tablet Commonly  known as:  BRILINTA Take 1 tablet (90 mg total) by mouth 2 (two) times daily.      Acute coronary syndrome (MI, NSTEMI, STEMI, etc) this admission?: Yes.     AHA/ACC Clinical Performance & Quality Measures: 1. Aspirin prescribed? - Yes 2. ADP Receptor Inhibitor (Plavix/Clopidogrel, Brilinta/Ticagrelor or Effient/Prasugrel) prescribed (includes medically managed patients)? - Yes 3. Beta Blocker prescribed? - Yes 4. High Intensity Statin (Lipitor 40-80mg  or Crestor 20-40mg ) prescribed? - Yes 5. EF assessed during THIS hospitalization? - Yes 6. For EF <40%, was ACEI/ARB prescribed? - Yes 7. For EF <40%, Aldosterone Antagonist (Spironolactone or Eplerenone) prescribed? - Not Applicable (EF >/= 79%) 8. Cardiac Rehab Phase II ordered (Included Medically managed Patients)? - Yes  Outstanding Labs/Studies   FLP/LFTs in 6 weeks.   Duration of Discharge Encounter   Greater than 30 minutes including physician time.  Signed, Reino Bellis NP-C 12/18/2018, 11:01 PM   I saw and evaluated the patient this morning along with Reino Bellis, NP-C. She looks great this morning.  Has ambulated in the hall without difficulty.  Tolerating medications.  No further chest pain.  Stable for discharge.  Agree with discharge summary.  Glenetta Hew, M.D., M.S. Interventional Cardiologist   Pager # [phone] Phone # [phone] [address] [place], [place] [address]

## 2018-12-18 NOTE — Progress Notes (Signed)
CARDIAC REHAB PHASE I   Completed d/c ed with pt. Pt denies CP or SOB with ambulation. Pt given heart healthy diet. Reviewed restrictions and exercise guidelines. Will refer to CRP II [place], annd [place]. Pt denies further questions or concerns at this time.  [id number] Rufina Falco, RN BSN 12/18/2018 10:28 AM

## 2018-12-19 NOTE — Telephone Encounter (Signed)
I called the patient and gave her the PCP locator number:  [phone].  She was thankful for the call.

## 2018-12-19 NOTE — Telephone Encounter (Signed)
Patient contacted regarding discharge from [facility] on 12/18/18.  Patient understands to follow up with provider Truitt Merle on 01/01/19 at 11:30 am at [facility]. Patient understands discharge instructions? yes Patient understands medications and regiment? yes Patient understands to bring all medications to this visit? yes

## 2018-12-20 ENCOUNTER — Other Ambulatory Visit: Payer: Self-pay | Admitting: *Deleted

## 2018-12-20 NOTE — Patient Outreach (Addendum)
[place] [facility]) Care Management  12/20/2018  [name] [birth year] [id number]  Transition of care call   Referral received: 12/17/18 Initial outreach: 12/20/18 Insurance: [place] Choice Plan   Subjective: Initial successful telephone call to patient's preferred number in order to complete transition of care assessment; 2 HIPAA identifiers verified. Explained purpose of call and completed transition of care assessment.  [name] states she is doing well, denies chest pain or need to use sublingual nitroglycerine, denies shortness of breath with activity,  tolerating  diet , denies bowel or bladder problems. States she is weighing herself daily and recording and taking her blood pressure. She says she needs to establish a new primary care MD as Dr. Edrick Oh retired and she only saw him for acute issues because Dr. Radene Knee, her gynecologist, performed her annual wellness exams.  [name] says she would welcome any educational material related to CAD. stenting procedure, heart healthy diet etc.    Objective:  [name] was hospitalized at [facility] from 3/1-12/18/2018 for acute non ST elevation myocardial infarction. She underwent PCI and placement of drug eluting stent in the LAD to treat 99% stenosis on 12/16/18. Comorbidities include: hyperlipidemia, and morbid obesity She was discharged to home on 12/18/18 without the need for home health services or DME.   Assessment:  Patient voices good understanding of all discharge instructions.  See transition of care flowsheet for assessment details.   Plan:  Teaching done with [name] regarding the benefits  of using a [facility] pharmacy for her medication refills. Included a hand out of the Zero Copay Medications for blood pressure and cholesterol at any of the [facility] pharmacies.  Included the following Emmi education modules hand outs in the mailing to her home:  About Coronary Disease, Coronary Artery Disease and Blood Pressure,  Chest Pain, Blood Pressure Testing and Measurement, Coronary Heart Disease in Women, Coronary Stenting, Drug Eluting Stents, Angina, Heart Attack in Women Discharge Instructions, Heart Healthy Diet, Medicines After A Heart Attack, What Can Go Wrong After a Heart Attack, and When Coronary Arteries Become Blocked. Reviewed [name]'s Active Health Management 2020 Wellness Requirements of: Completing the computerized Health Assessment and the Health Action Step with Active Health Management [facility]) by June 18 2019 AND have an annual physical between October 17, 2017 and April 17, 2019.  No ongoing care management needs identified so will close case to [place] [facility] care management services and route successful outreach letter with [place] Management pamphlet and 24 Hour Nurse Line Magnet to [place] [facility] clinical pool to be mailed to patient's home address.   Barrington Ellison RN,CCM,CDE [place] Management Coordinator Office Phone [phone] Office Fax [phone]

## 2018-12-21 ENCOUNTER — Telehealth (HOSPITAL_COMMUNITY): Payer: Self-pay

## 2018-12-21 NOTE — Telephone Encounter (Signed)
Pt insurance is active and benefits verified through [place] 0, DED 0/0 met, out of pocket 0/0 met, co-insurance 0. no pre-authorization required, REF# [id number]  Will contact patient to see if she is interested in the Cardiac Rehab Program. If interested, patient will need to complete follow up appt. Once completed, patient will be contacted for scheduling upon review by the RN Navigator. Tedra Senegal. Support Rep II

## 2019-01-01 ENCOUNTER — Encounter: Payer: Self-pay | Admitting: Nurse Practitioner

## 2019-01-01 ENCOUNTER — Other Ambulatory Visit: Payer: Self-pay

## 2019-01-01 ENCOUNTER — Ambulatory Visit: Payer: 59 | Admitting: Nurse Practitioner

## 2019-01-01 VITALS — BP 100/60 | HR 88 | Ht 59.0 in | Wt 184.8 lb

## 2019-01-01 DIAGNOSIS — E785 Hyperlipidemia, unspecified: Secondary | ICD-10-CM

## 2019-01-01 DIAGNOSIS — I2102 ST elevation (STEMI) myocardial infarction involving left anterior descending coronary artery: Secondary | ICD-10-CM | POA: Diagnosis not present

## 2019-01-01 MED ORDER — ATORVASTATIN CALCIUM 80 MG PO TABS: 80.0000 mg | ORAL_TABLET | Freq: Every day | ORAL | 3 refills | Status: DC

## 2019-01-01 MED ORDER — TICAGRELOR 90 MG PO TABS: 90.0000 mg | ORAL_TABLET | Freq: Two times a day (BID) | ORAL | 3 refills | Status: DC

## 2019-01-01 MED ORDER — CARVEDILOL 6.25 MG PO TABS: 6.2500 mg | ORAL_TABLET | Freq: Two times a day (BID) | ORAL | 3 refills | Status: DC

## 2019-01-01 MED ORDER — LOSARTAN POTASSIUM 25 MG PO TABS: 25.0000 mg | ORAL_TABLET | Freq: Every day | ORAL | 3 refills | Status: DC

## 2019-01-01 MED FILL — CARVEDILOL 6.25 MG TABLET: 6.25 | 90 days supply | Qty: 180 | Fill #0

## 2019-01-01 MED FILL — ATORVASTATIN 80 MG TABLET: 80 | 90 days supply | Qty: 90 | Fill #0

## 2019-01-01 MED FILL — BRILINTA 90 MG TABLET: 90 | 90 days supply | Qty: 180 | Fill #0

## 2019-01-01 MED FILL — LOSARTAN POTASSIUM 25 MG TA: 25 | 90 days supply | Qty: 90 | Fill #0

## 2019-01-01 NOTE — Patient Instructions (Addendum)
We will be checking the following labs today - BMET, CBC, and HPF    Medication Instructions:    Continue with your current medicines. BUT  STOP Imdur  Increase the Losartan to 25 mg a day  I refilled your medicines  Ok to use OTC Pepcid or Nexium/Priosec if needed.    If you need a refill on your cardiac medications before your next appointment, please call your pharmacy.     Testing/Procedures To Be Arranged:  N/A  Follow-Up:   See me in about 10 days - we will talk about returning to work then.     At [facility], you and your health needs are our priority.  As part of our continuing mission to provide you with exceptional heart care, we have created designated Provider Care Teams.  These Care Teams include your primary Cardiologist (physician) and Advanced Practice Providers (APPs -  Physician Assistants and Nurse Practitioners) who all work together to provide you with the care you need, when you need it.  Special Instructions:  . None  Call the [place] office at [phone] if you have any questions, problems or concerns.

## 2019-01-01 NOTE — Progress Notes (Signed)
CARDIOLOGY OFFICE NOTE  Date:  01/01/2019    [name] Date of Birth: Feb 28, 1966 Medical Record #[id number]  PCP:  [name], MD  Cardiologist:  Jennings Books  Chief Complaint  Patient presents with  . Coronary Artery Disease    TOC - seen for Dr. Tamala Julian    History of Present Illness: [name] is a 53 y.o. female who presents today for a post hospital/TOC visit. Seen for Dr. Tamala Julian. Former patient of Dr. Harrington Challenger (last seen in 2013).   She has had no significant past medical history.   She presented earlier this month to the ER with severe chest pain - had EKG changes and referred for emergent cath - found to have 99% pLAD stenosis s/p PCI with 3.0 x15 DES. Post stenting there did appear to be some ostial disease in the LAD upstream of the stent that was not initially seen (suspect that this is more step up to stent than true stenosis).  She was placed on DAPT with ASA/Brilinta post cath. Troponin peaked at 10.92. LDL at 104, placed on high dose statin. Started on low dose BB but could not tolerate. ARB was added. Imdur added for residual chest pain.  EF on LV gram noted at 35%, but follow up echo with 55-60% with mild basal septal hypertrophy.   Patient and husband screened for recent travel, fever, URI symptoms and shortness of breath. Patient and husband deny travel over the last 14 days and are currently without symptoms.    Comes in today. Here with her husband. Lots of concerns. She has been home a few weeks - did have some chest pain initially - this improved - then developed some "twinges" - very fleeting - describes as "sticking/poking". She is walking almost 14 minutes a day and feels great with this. Not short of breath. Some fatigue noted. Still with considerable headache with the Imdur - even with taking at night and using Tylenol. Some stomach burning at times -not consistent. She was on no medicines prior to this event. Needs all medicines refilled today.  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 Not dizzy. BP diary noted - BP higher by about 10 to 15 points at home.   Past Medical History:  Diagnosis Date  . Acute non-ST elevation myocardial infarction (NSTEMI) ([place]) 12/16/2018  . CAD S/P DES PCI -pLAD 12/16/2018   Cath 12/15/2017: Successful proximal LAD synergy 3.0 x 15 DES postdilated to 3.25 mm reducing the stenosis to 0% ; ? 60-70% ostial LAD noted on some images.    Past Surgical History:  Procedure Laterality Date  . CESAREAN SECTION    . CORONARY/GRAFT ACUTE MI REVASCULARIZATION N/A 12/16/2018   Procedure: Coronary/Graft Acute MI Revascularization;  Surgeon: Belva Crome, MD;  Location: [facility];  Service: Cardiovascular;  Laterality: N/A;  . IUD REMOVAL N/A 05/16/2013   Procedure: INTRAUTERINE DEVICE (IUD) REMOVAL;  Surgeon: Darlyn Chamber, MD;  Location: [facility];  Service: Gynecology;  Laterality: N/A;  . LAPAROSCOPIC TUBAL LIGATION Bilateral 05/16/2013   Procedure: LAPAROSCOPIC TUBAL LIGATION;  Surgeon: Darlyn Chamber, MD;  Location: [facility];  Service: Gynecology;  Laterality: Bilateral;  . LEFT HEART CATH AND CORONARY ANGIOGRAPHY N/A 12/16/2018   Procedure: LEFT HEART CATH AND CORONARY ANGIOGRAPHY;  Surgeon: Belva Crome, MD;  Location: [place] [facility];  Service: Cardiovascular;  Laterality: N/A;     Medications: Current Meds  Medication Sig  . aspirin 81 MG chewable tablet Chew 1 tablet (81 mg total) by mouth daily.  Marland Kitchen atorvastatin (LIPITOR) 80 MG tablet Take 1 tablet (80 mg total) by mouth daily at 6 PM.  . carvedilol (COREG) 6.25 MG tablet Take 1 tablet (6.25 mg total) by mouth 2 (two) times daily with a meal.  . losartan (COZAAR) 25 MG tablet Take 1 tablet (25 mg total) by mouth daily.  . nitroGLYCERIN (NITROSTAT) 0.4 MG SL tablet Place 1 tablet (0.4 mg total) under the tongue every 5 (five) minutes as needed for chest pain.   . ticagrelor (BRILINTA) 90 MG TABS tablet Take 1 tablet (90 mg total) by mouth 2 (two) times daily.  . [DISCONTINUED] atorvastatin (LIPITOR) 80 MG tablet Take 1 tablet (80 mg total) by mouth daily at 6 PM.  . [DISCONTINUED] carvedilol (COREG) 6.25 MG tablet Take 1 tablet (6.25 mg total) by mouth 2 (two) times daily with a meal.  . [DISCONTINUED] isosorbide mononitrate (IMDUR) 30 MG 24 hr tablet Take 1 tablet (30 mg total) by mouth daily.  . [DISCONTINUED] losartan (COZAAR) 25 MG tablet Take 0.5 tablets (12.5 mg total) by mouth daily.  . [DISCONTINUED] ticagrelor (BRILINTA) 90 MG TABS tablet Take 1 tablet (90 mg total) by mouth 2 (two) times daily.     Allergies: Allergies  Allergen Reactions  . Tetracycline Rash    Social History: The patient  reports that she has never smoked. She has never used smokeless tobacco. She reports current alcohol use. She reports that she does not use drugs.   Family History: The patient's family history includes CAD in her mother; Cancer in her paternal grandfather; Colon cancer in her father and another family member; Heart attack in her maternal grandfather, mother, and another family member; Heart disease in an other family member; Heart failure in her maternal grandmother; High blood pressure in her mother; Hyperlipidemia in her mother and sister.   Review of Systems: Please see the history of present illness.   Otherwise, the review of systems is positive for none.   All other systems are reviewed and negative.   Physical Exam: VS:  BP 100/60 (BP Location: Left Arm, Patient Position: Sitting, Cuff Size: Normal)   Pulse 88   Ht 4\' 11"  (1.499 m)   Wt 184 lb 12.8 oz (83.8 kg)   SpO2 96% Comment: at rest  BMI 37.33 kg/m  .  BMI Body mass index is 37.33 kg/m.  Wt Readings from Last 3 Encounters:  01/01/19 184 lb 12.8 oz (83.8 kg)  12/18/18 186 lb 12.8 oz (84.7 kg)  09/22/17 189 lb (85.7 kg)    General: Pleasant. Well developed, well nourished  and in no acute distress.   HEENT: Normal.  Neck: Supple, no JVD, carotid bruits, or masses noted.  Cardiac: Regular rate and rhythm. No murmurs, rubs, or gallops. No edema.  Respiratory:  Lungs are clear to auscultation bilaterally with normal work of breathing.  GI: Soft and nontender.  MS: No deformity or atrophy. Gait and ROM intact.  Skin: Warm and dry. Color is normal.  Neuro:  Strength and sensation are intact and no gross focal deficits noted.  Psych: Alert, appropriate and with normal affect.   LABORATORY DATA:  EKG:  EKG is ordered today. This demonstrates NSR - reviewed with Dr. Tamala Julian  Lab Results  Component Value Date   WBC 16.5 (H) 12/17/2018   HGB 11.1 (L)  12/17/2018   HCT 34.1 (L) 12/17/2018   PLT 315 12/17/2018   GLUCOSE 103 (H) 12/18/2018   CHOL 154 12/16/2018   TRIG 74 12/16/2018   HDL 35 (L) 12/16/2018   LDLCALC 104 (H) 12/16/2018   ALT 16 12/17/2018   AST 70 (H) 12/17/2018   NA 140 12/18/2018   K 3.7 12/18/2018   CL 108 12/18/2018   CREATININE 0.79 12/18/2018   BUN 8 12/18/2018   CO2 24 12/18/2018   TSH 4.499 12/16/2018   INR 1.0 12/16/2018   HGBA1C 5.4 12/17/2018     BNP (last 3 results) Recent Labs    12/16/18 0629  BNP 23.5    ProBNP (last 3 results) No results for input(s): PROBNP in the last 8760 hours.   Other Studies Reviewed Today:  Cath: 12/16/2018   Anterior non-ST elevation MI with initial angiography demonstrating 99% dynamic stenosis in the proximal LAD with TIMI grade II flow.  Successful proximal LAD synergy 3.0 x 15 DES postdilated to 3.25 mm reducing the stenosis to 0% with TIMI grade III flow. Post stent deployment and orthogonal view in the LAO caudal demonstrated ostial eccentric 60 to 70% LAD stenosis. This lesion is unrecognizable in the RAO and cranial views.  Normal left main  Normal circumflex  Normal RCA  Anteroapical akinesis with EF 35%. LVEDP 30 mmHg.  RECOMMENDATIONS:   Plan aspirin and  Brilinta x12 months.  Careful early and subsequent follow-up as there is residual borderline significant ostial LAD disease that will be difficult to treat with percutaneous intervention without involving the left main. Recurrent chest pain or difficulty during the hospital stay may reflect ischemia and should be carefully reevaluated.  High intensity statin therapy  Check hemoglobin A 1C  Beta-blocker therapy in the form of carvedilol 3.125 mg twice daily  IV nitroglycerin  Discharge 48 to 72 hours depending upon clinical course and whether there is recurrent chest discomfort.  TTE: 12/17/2018 IMPRESSIONS  1. The left ventricle has normal systolic function, with an ejection fraction of 55-60%. The cavity size was normal. Mild basal septal hypertrophy. Left ventricular diastolic parameters were normal. 2. The mitral valve is normal in structure. 3. The tricuspid valve is normal in structure. 4. The aortic valve is tricuspid. 5. The pulmonic valve was normal in structure. _____________   Assessment/Plan:  1. Anterior MI with PCI to the LAD -  Post stent deployment and orthogonal view in the LAO caudal demonstrated ostial eccentric 60 to 70% LAD stenosis. This lesion is unrecognizable in the RAO and cranial views. Her chest pain is resolved. She has some twinges - fleeting - nothing like her prior chest pain syndrome. Discussed with Dr. Tamala Julian - stopping Imdur - she will use sl NTG as needed. Increasing ARB today.   2. Initial LV dysfunction - improved by her echo - on low dose beta blocker/ARB - increasing her ARB today.   3. HLD - on statin - will need follow up lab  4. Obesity - CV risk factor modification encouraged - she seems quite motivated.   5.  Elevated LFTs - rechecking today  Current medicines are reviewed with the patient today.  The patient does not have concerns regarding medicines other than what has been noted above.  The following changes have been made:   See above.  Labs/ tests ordered today include:    Orders Placed This Encounter  Procedures  . Basic metabolic panel  . CBC  . Hepatic function panel  . EKG  12-Lead     Disposition:   FU with me in 2 weeks. Will hopefully plan to return to work 1/2 shifts on return for a week or so and then hopefully back full time.   Patient is agreeable to this plan and will call if any problems develop in the interim.   SignedTruitt Merle, NP  01/01/2019 12:49 PM  [facility] [address] [place], [place]  [address] Phone: [phone] Fax: [phone]

## 2019-01-02 LAB — BASIC METABOLIC PANEL
BUN/Creatinine Ratio: 17 (ref 9–23)
BUN: 14 mg/dL (ref 6–24)
CO2: 26 mmol/L (ref 20–29)
Calcium: 9.7 mg/dL (ref 8.7–10.2)
Chloride: 102 mmol/L (ref 96–106)
Creatinine, Ser: 0.83 mg/dL (ref 0.57–1.00)
GFR calc Af Amer: 95 mL/min/{1.73_m2} (ref >59)
GFR calc non Af Amer: 82 mL/min/{1.73_m2} (ref >59)
Glucose: 91 mg/dL (ref 65–99)
Potassium: 4.8 mmol/L (ref 3.5–5.2)
Sodium: 143 mmol/L (ref 134–144)

## 2019-01-02 LAB — HEPATIC FUNCTION PANEL
ALT: 18 IU/L (ref 0–32)
AST: 16 IU/L (ref 0–40)
Albumin: 4.1 g/dL (ref 3.8–4.8)
Alkaline Phosphatase: 108 IU/L (ref 39–117)
Bilirubin Total: 0.9 mg/dL (ref 0.0–1.2)
Bilirubin, Direct: 0.24 mg/dL (ref 0.00–0.40)
Total Protein: 6.5 g/dL (ref 6.0–8.5)

## 2019-01-02 LAB — CBC
Hematocrit: 40.2 % (ref 34.0–46.6)
Hemoglobin: 13.4 g/dL (ref 11.1–15.9)
MCH: 30.6 pg (ref 26.6–33.0)
MCHC: 33.3 g/dL (ref 31.5–35.7)
MCV: 92 fL (ref 79–97)
Platelets: 359 10*3/uL (ref 150–450)
RBC: 4.38 x10E6/uL (ref 3.77–5.28)
RDW: 12.8 % (ref 11.7–15.4)
WBC: 11.9 10*3/uL — ABNORMAL HIGH (ref 3.4–10.8)

## 2019-01-03 ENCOUNTER — Encounter: Payer: Self-pay | Admitting: Nurse Practitioner

## 2019-01-04 NOTE — Telephone Encounter (Signed)
Called and spoke with pt in regards to CR, adv we have recv'd the pt referral. And at this time we are not scheduling due to the COVID-19. Once we have resume scheduling we will contact the pt. She verbalized understanding. Tedra Senegal. Support Rep II

## 2019-01-08 ENCOUNTER — Telehealth: Payer: Self-pay | Admitting: Nurse Practitioner

## 2019-01-08 NOTE — Telephone Encounter (Signed)
Spoke with patient today. Has OV with me on Monday, March 30th at 11:30 AM  She is agreeable to trying virtual visit.   Email verified and message sent with instructions/consent.    Burtis Junes, RN, [place] [address] [place], [place]  [phone]

## 2019-01-14 ENCOUNTER — Telehealth (INDEPENDENT_AMBULATORY_CARE_PROVIDER_SITE_OTHER): Payer: 59 | Admitting: Nurse Practitioner

## 2019-01-14 ENCOUNTER — Telehealth: Payer: Self-pay | Admitting: *Deleted

## 2019-01-14 VITALS — BP 112/86 | HR 90 | Wt 182.6 lb

## 2019-01-14 DIAGNOSIS — R000 Tachycardia, unspecified: Secondary | ICD-10-CM

## 2019-01-14 DIAGNOSIS — I2102 ST elevation (STEMI) myocardial infarction involving left anterior descending coronary artery: Secondary | ICD-10-CM | POA: Diagnosis not present

## 2019-01-14 DIAGNOSIS — E785 Hyperlipidemia, unspecified: Secondary | ICD-10-CM

## 2019-01-14 DIAGNOSIS — R079 Chest pain, unspecified: Secondary | ICD-10-CM

## 2019-01-14 MED ORDER — CARVEDILOL 6.25 MG PO TABS: 9.7500 mg | ORAL_TABLET | Freq: Two times a day (BID) | ORAL | 3 refills | Status: DC

## 2019-01-14 NOTE — Telephone Encounter (Signed)
S/w pt to confirm televisit today at 11:30 am. Code and password given.   Pt will take bp before visit and discuss with Cecille Rubin.  Pt's medication's are correct and does not need any refills at this time.  Pt weight today was 182.6 lb.  Pt stated does not feel is progressing with care, stated is walking everyday but outside is different than a treadmill.  Will send to [facility] to review before upcoming appt.

## 2019-01-14 NOTE — Progress Notes (Signed)
Will try to control the symptoms with medical therapy.  It will be important to uptitrate beta-blocker therapy to control heart rate.

## 2019-01-14 NOTE — Telephone Encounter (Signed)
Virtual visit completed and these concerns were addressed.

## 2019-01-14 NOTE — Progress Notes (Signed)
Virtual Visit via Video Note    Evaluation Performed:  Follow-up visit  This visit type was conducted due to national recommendations for restrictions regarding the COVID-19 Pandemic (e.g. social distancing).  This format is felt to be most appropriate for this patient at this time.  All issues noted in this document were discussed and addressed.  No physical exam was performed (except for noted visual exam findings with Video Visits).  Please refer to the patient's chart (MyChart message for video visits and phone note for telephone visits) for the patient's consent to telehealth for [facility].  Date:  01/14/2019   ID:  [name], DOB [birth year], MRN [id number]  Patient Location:  Home  Provider location:   [place]  PCP:  [name], MD  Cardiologist:  Sinclair Grooms, MD  Electrophysiologist:  None   Chief Complaint:  Follow up visit - one week check.   History of Present Illness:    [name] is a 53 y.o. female who presents via audio/video conferencing for a telehealth visit today.    Seen for Dr. Tamala Julian. Former patient of Dr. Harrington Challenger (last seen in 2013).   She has had no significant past medical history until recently.   She presented earlier this month (March 2019) to the ER with severe chest pain - had EKG changes and referred for emergent cath - found to have 99% pLAD stenosis s/p PCI with3.0x15 DES.Post stenting there did appear to be some ostial disease in the LAD upstream of the stent that was not initially seen (suspect that this is more step up to stent than true stenosis).  She was placed on DAPT with ASA/Brilinta post cath. Troponin peaked at 10.92. LDL at 104, placed on high dose statin. Started on low dose BB but could not tolerate. ARB was added. Imdur added for residual chest pain.  EF on LV gram noted at 35%, but follow up echo with 55-60% with mild basal septal hypertrophy.   I saw her last week - had lots of  concerns. Has had some residual chest pain. Lots of headache with the Imdur - this was stopped and her ARB was increased.   Patient was seen today via virtual visit. She is frustrated that she is not making progress. Still with fatigue. Thought she would be at a point that she could be doing more by now - and this is not the case. Some shortness of breath - sometimes with just getting out of the shower. Still with some chest pressure with exertion. She can do her treadmill for about 30 minutes - this does make her tired. She has a walk outside that has an incline - she will have chest discomfort/SOB with this - has to stop about 3 times. Has not had to use any NTG - typically recovers pretty quickly in about 20 to 30 seconds. She tried to walk with a friend - could not keep up. No more headache noted. Heart rate is trending up - 90 to 100's at home. BP is 117/84 to 112/86. She is not dizzy.   The patient does not symptoms concerning for COVID-19 infection (fever, chills, cough, or new shortness of breath).    Prior CV studies:   The following studies were reviewed today:  Cath: 12/16/2018   Anterior non-ST elevation MI with initial angiography demonstrating 99% dynamic stenosis in the proximal LAD with TIMI grade II flow.  Successful proximal LAD synergy 3.0 x 15 DES  postdilated to 3.25 mm reducing the stenosis to 0% with TIMI grade III flow. Post stent deployment and orthogonal view in the LAO caudal demonstrated ostial eccentric 60 to 70% LAD stenosis. This lesion is unrecognizable in the RAO and cranial views.  Normal left main  Normal circumflex  Normal RCA  Anteroapical akinesis with EF 35%. LVEDP 30 mmHg.  RECOMMENDATIONS:   Plan aspirin and Brilinta x12 months.  Careful early and subsequent follow-up as there is residual borderline significant ostial LAD disease that will be difficult to treat with percutaneous intervention without involving the left main. Recurrent chest  pain or difficulty during the hospital stay may reflect ischemia and should be carefully reevaluated.  High intensity statin therapy  Check hemoglobin A 1C  Beta-blocker therapy in the form of carvedilol 3.125 mg twice daily  IV nitroglycerin  Discharge 48 to 72 hours depending upon clinical course and whether there is recurrent chest discomfort.  TTE: 12/17/2018 IMPRESSIONS  1. The left ventricle has normal systolic function, with an ejection fraction of 55-60%. The cavity size was normal. Mild basal septal hypertrophy. Left ventricular diastolic parameters were normal. 2. The mitral valve is normal in structure. 3. The tricuspid valve is normal in structure. 4. The aortic valve is tricuspid. 5. The pulmonic valve was normal in structure. _____________   Past Medical History:  Diagnosis Date  . Acute non-ST elevation myocardial infarction (NSTEMI) ([place]) 12/16/2018  . CAD S/P DES PCI -pLAD 12/16/2018   Cath 12/15/2017: Successful proximal LAD synergy 3.0 x 15 DES postdilated to 3.25 mm reducing the stenosis to 0% ; ? 60-70% ostial LAD noted on some images.   Past Surgical History:  Procedure Laterality Date  . CESAREAN SECTION    . CORONARY/GRAFT ACUTE MI REVASCULARIZATION N/A 12/16/2018   Procedure: Coronary/Graft Acute MI Revascularization;  Surgeon: Belva Crome, MD;  Location: [place] [facility];  Service: Cardiovascular;  Laterality: N/A;  . IUD REMOVAL N/A 05/16/2013   Procedure: INTRAUTERINE DEVICE (IUD) REMOVAL;  Surgeon: Darlyn Chamber, MD;  Location: [facility];  Service: Gynecology;  Laterality: N/A;  . LAPAROSCOPIC TUBAL LIGATION Bilateral 05/16/2013   Procedure: LAPAROSCOPIC TUBAL LIGATION;  Surgeon: Darlyn Chamber, MD;  Location: [facility];  Service: Gynecology;  Laterality: Bilateral;  . LEFT HEART CATH AND CORONARY ANGIOGRAPHY N/A 12/16/2018   Procedure: LEFT HEART CATH AND CORONARY ANGIOGRAPHY;  Surgeon: Belva Crome, MD;  Location: [facility];  Service:  Cardiovascular;  Laterality: N/A;     Current Meds  Medication Sig  . aspirin 81 MG chewable tablet Chew 1 tablet (81 mg total) by mouth daily.  Marland Kitchen atorvastatin (LIPITOR) 80 MG tablet Take 1 tablet (80 mg total) by mouth daily at 6 PM.  . carvedilol (COREG) 6.25 MG tablet Take 1 tablet (6.25 mg total) by mouth 2 (two) times daily with a meal.  . ticagrelor (BRILINTA) 90 MG TABS tablet Take 1 tablet (90 mg total) by mouth 2 (two) times daily.     Allergies:   Tetracycline   Social History   Tobacco Use  . Smoking status: Never Smoker  . Smokeless tobacco: Never Used  Substance Use Topics  . Alcohol use: Yes    Comment:  once a month  . Drug use: No     Family Hx: The patient's family history includes CAD in her mother; Cancer in her paternal grandfather; Colon cancer in her father and another family member; Heart attack in her maternal grandfather, mother, and another family  member; Heart disease in an other family member; Heart failure in her maternal grandmother; High blood pressure in her mother; Hyperlipidemia in her mother and sister.  ROS:   Please see the history of present illness.  She admits she is frustrated with her lack of progress.    All other systems reviewed and are negative.   Labs/Other Tests and Data Reviewed:    Recent Labs: 12/16/2018: B Natriuretic Peptide 23.5; TSH 4.499 01/01/2019: ALT 18; BUN 14; Creatinine, Ser 0.83; Hemoglobin 13.4; Platelets 359; Potassium 4.8; Sodium 143   Recent Lipid Panel Lab Results  Component Value Date/Time   CHOL 154 12/16/2018 06:29 AM   TRIG 74 12/16/2018 06:29 AM   HDL 35 (L) 12/16/2018 06:29 AM   CHOLHDL 4.4 12/16/2018 06:29 AM   LDLCALC 104 (H) 12/16/2018 06:29 AM    Wt Readings from Last 3 Encounters:  01/14/19 182 lb 9.6 oz (82.8 kg)  01/01/19 184 lb 12.8 oz (83.8 kg)  12/18/18 186 lb 12.8 oz (84.7 kg)     Exam:    Vital Signs:  BP 112/86   Pulse 90   Wt 182 lb 9.6 oz (82.8 kg)   BMI 36.88 kg/m     Well nourished, well developed female in no acute distress. She is able to answer appropriately. No evidence of shortness of breath noted.    ASSESSMENT & PLAN:    1. Anterior MI with PCI to the LAD -  Post stent deployment and orthogonal view in the LAO caudal demonstrated ostial eccentric 60 to 70% LAD stenosis. This lesion is unrecognizable in the RAO and cranial views. She has continued to have some fleeting exertional chest pain - she has not had to use NTG sl. She does not feel like she has progressed to where she should be. HR now higher - will try to increase her Coreg to 9.75 BID - may have to cut the ARB if BP too low - and would ideally like to work towards getting her on 12.5 mg of Coreg BID. Can still use sl NTG if needed. 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 Will reassess in one week. If we cannot medically manage, may need re-look cath.    2. Initial LV dysfunction - improved by her echo - on low dose beta blocker/ARB - her ARB was increased last week - see #1.   3. HLD - on statin - will need follow up lab when seen back in the office.   4. Obesity - continue with CV risk factor modification.   5.  Elevated LFTs - recheck last week was ok.   6. COVID-19 Education: The signs and symptoms of COVID-19 were discussed with the patient and how to seek care for testing (follow up with PCP or arrange E-visit).  The importance of social distancing was discussed today.  Patient Risk:   After full review of this patients clinical status, I feel that they are at least moderate risk at this time.  Time:   Today, I have spent 30 minutes with the patient with telehealth technology discussing the above issues.     Medication Adjustments/Labs and Tests Ordered: Current medicines are reviewed at length with the patient today.  Concerns regarding medicines are outlined above.  Tests  Ordered: No orders of the defined types were placed in this encounter.  Medication Changes: No orders of the  defined types were placed in this encounter.   Disposition:  Follow up with Dr. Tamala Julian for virtual visit next week. Her LOA papers have been completed by me today.   Amie Critchley, NP  01/14/2019 12:22 PM    [facility]

## 2019-01-21 ENCOUNTER — Telehealth: Payer: 59 | Admitting: Interventional Cardiology

## 2019-01-23 ENCOUNTER — Telehealth (INDEPENDENT_AMBULATORY_CARE_PROVIDER_SITE_OTHER): Payer: 59 | Admitting: Interventional Cardiology

## 2019-01-23 ENCOUNTER — Other Ambulatory Visit: Payer: Self-pay

## 2019-01-23 ENCOUNTER — Encounter: Payer: Self-pay | Admitting: Interventional Cardiology

## 2019-01-23 ENCOUNTER — Telehealth: Payer: Self-pay | Admitting: Interventional Cardiology

## 2019-01-23 VITALS — BP 119/83 | HR 71 | Ht 59.0 in | Wt 182.8 lb

## 2019-01-23 DIAGNOSIS — I209 Angina pectoris, unspecified: Secondary | ICD-10-CM | POA: Diagnosis not present

## 2019-01-23 DIAGNOSIS — I2510 Atherosclerotic heart disease of native coronary artery without angina pectoris: Secondary | ICD-10-CM

## 2019-01-23 DIAGNOSIS — E785 Hyperlipidemia, unspecified: Secondary | ICD-10-CM

## 2019-01-23 DIAGNOSIS — Z9861 Coronary angioplasty status: Secondary | ICD-10-CM

## 2019-01-23 MED ORDER — CARVEDILOL 12.5 MG PO TABS: 12.5000 mg | ORAL_TABLET | Freq: Two times a day (BID) | ORAL | 3 refills | Status: DC

## 2019-01-23 NOTE — Telephone Encounter (Signed)
Does she feel a need to stay out of work?

## 2019-01-23 NOTE — Telephone Encounter (Signed)
Spoke with pt and went over recommendations from Dr. Tamala Julian from virtual visit today.  Pt mentioned that her short term disability runs out on Monday unless it is extended.  Pt states she needs something sent over to the claims dept at the disability office stating when she can return to work and why she needs to remain out of work.  Advised I will send to Dr. Tamala Julian to see about writing a letter with this information in it.  Pt asked that it be sent to [place] at [place]. Fax # [phone] Claim # [id number]

## 2019-01-23 NOTE — Patient Instructions (Signed)
Medication Instructions:  1) INCREASE Carvedilol to 12.5 mg twice daily  If you need a refill on your cardiac medications before your next appointment, please call your pharmacy.   Lab work: You will have a liver and lipid panel drawn on 02/06/2019.  Please come fasting for these labs.  If you have labs (blood work) drawn today and your tests are completely normal, you will receive your results only by: Marland Kitchen MyChart Message (if you have MyChart) OR . A paper copy in the mail If you have any lab test that is abnormal or we need to change your treatment, we will call you to review the results.  Testing/Procedures: None  Follow-Up: We have scheduled a follow up appointment with Dr. Tamala Julian on July 22 at 11:20am  Any Other Special Instructions Will Be Listed Below (If Applicable). Dr. Tamala Julian would like for you to monitor your blood pressure and heart rate from now until Monday or Tuesday of next week and then call the office or send a MyChart message with those readings.

## 2019-01-23 NOTE — Progress Notes (Signed)
Virtual Visit via Video Note   This visit type was conducted due to national recommendations for restrictions regarding the COVID-19 Pandemic (e.g. social distancing) in an effort to limit this patient's exposure and mitigate transmission in our community.  Due to her co-morbid illnesses, this patient is at least at moderate risk for complications without adequate follow up.  This format is felt to be most appropriate for this patient at this time.  All issues noted in this document were discussed and addressed.  A limited physical exam was performed with this format.  Please refer to the patient's chart for her consent to telehealth for [facility].   Evaluation Performed:  Follow-up visit  Date:  01/23/2019   ID:  [name], DOB 1965/11/27, MRN [id number]  Patient Location: Home  Provider Location: Office  PCP:  [name], MD  Cardiologist:  Sinclair Grooms, MD  Electrophysiologist:  None   Chief Complaint: CAD follow-up/dyspnea on exertion  History of Present Illness:    [name] is a 53 y.o. female who presents via audio/video conferencing for a telehealth visit today.    The patient has recent anterior wall STEMI 12/16/2018 treated with proximal LAD stent, residual ostial LAD disease in the 50 to 70% range, obesity, hyperlipidemia, and physical deconditioning.  She is experiencing mild dyspnea on exertion.  She is walking 1.5 miles.  She has not yet achieved 150 minutes of moderate activity per week.  She is not having chest discomfort.  No nitroglycerin use has been required.  No orthopnea, PND, lower extremity swelling.  With walking, she does have to stop from time to time because of dyspnea especially when going up hills.  Otherwise no complaints.  She is having anxiety that she is not improving as fast as she would like to.  The patient does not have symptoms concerning for COVID-19 infection (fever, chills, cough, or new shortness of breath).    Past  Medical History:  Diagnosis Date  . Acute non-ST elevation myocardial infarction (NSTEMI) ([place]) 12/16/2018  . CAD S/P DES PCI -pLAD 12/16/2018   Cath 12/15/2017: Successful proximal LAD synergy 3.0 x 15 DES postdilated to 3.25 mm reducing the stenosis to 0% ; ? 60-70% ostial LAD noted on some images.   Past Surgical History:  Procedure Laterality Date  . CESAREAN SECTION    . CORONARY/GRAFT ACUTE MI REVASCULARIZATION N/A 12/16/2018   Procedure: Coronary/Graft Acute MI Revascularization;  Surgeon: Belva Crome, MD;  Location: [facility] [facility];  Service: Cardiovascular;  Laterality: N/A;  . IUD REMOVAL N/A 05/16/2013   Procedure: INTRAUTERINE DEVICE (IUD) REMOVAL;  Surgeon: Darlyn Chamber, MD;  Location: [facility];  Service: Gynecology;  Laterality: N/A;  . LAPAROSCOPIC TUBAL LIGATION Bilateral 05/16/2013   Procedure: LAPAROSCOPIC TUBAL LIGATION;  Surgeon: Darlyn Chamber, MD;  Location: [facility];  Service: Gynecology;  Laterality: Bilateral;  . LEFT HEART CATH AND CORONARY ANGIOGRAPHY N/A 12/16/2018   Procedure: LEFT HEART CATH AND CORONARY ANGIOGRAPHY;  Surgeon: Belva Crome, MD;  Location: [facility];  Service: Cardiovascular;  Laterality: N/A;     Current Meds  Medication Sig  . aspirin 81 MG chewable tablet Chew 1 tablet (81 mg total) by mouth daily.  Marland Kitchen atorvastatin (LIPITOR) 80 MG tablet Take 1 tablet (80 mg total) by mouth daily at 6 PM.  . losartan (COZAAR) 25 MG tablet Take 1 tablet (25 mg total) by mouth daily.  . nitroGLYCERIN (NITROSTAT) 0.4 MG SL  tablet Place 1 tablet (0.4 mg total) under the tongue every 5 (five) minutes as needed for chest pain.  . ticagrelor (BRILINTA) 90 MG TABS tablet Take 1 tablet (90 mg total) by mouth 2 (two) times daily.  . [DISCONTINUED] carvedilol (COREG) 6.25 MG tablet Take 1.5 tablets (9.375 mg total) by mouth 2 (two) times daily with a meal.     Allergies:   Tetracycline   Social History   Tobacco Use  . Smoking status: Never Smoker  . Smokeless  tobacco: Never Used  Substance Use Topics  . Alcohol use: Yes    Comment:  once a month  . Drug use: No     Family Hx: The patient's family history includes CAD in her mother; Cancer in her paternal grandfather; Colon cancer in her father and another family member; Heart attack in her maternal grandfather, mother, and another family member; Heart disease in an other family member; Heart failure in her maternal grandmother; High blood pressure in her mother; Hyperlipidemia in her mother and sister.  ROS:   Please see the history of present illness.    Difficulty sleeping in the prone position which was previously the preferred position.  No chest discomfort.  Anxiety concerning her overall condition.  Feels that she should be doing better than she currently is.  Has been monitoring blood pressures at home and they are doing well. All other systems reviewed and are negative.   Prior CV studies:   The following studies were reviewed today:  2D Doppler echocardiogram 12/17/2018:  IMPRESSIONS 1. The left ventricle has normal systolic function, with an ejection fraction of 55-60%. The cavity size was normal. Mild basal septal hypertrophy. Left ventricular diastolic parameters were normal.  2. The mitral valve is normal in structure.  3. The tricuspid valve is normal in structure.  4. The aortic valve is tricuspid.  5. The pulmonic valve was normal in structure.    Labs/Other Tests and Data Reviewed:    EKG: An EKG performed 01/01/2019 reveals normal sinus rhythm with T wave inversion in lead I, aVL, and V2.  The T wave abnormality is new compared to historical.  Recent Labs: 12/16/2018: B Natriuretic Peptide 23.5; TSH 4.499 01/01/2019: ALT 18; BUN 14; Creatinine, Ser 0.83; Hemoglobin 13.4; Platelets 359; Potassium 4.8; Sodium 143   Recent Lipid Panel Lab Results  Component Value Date/Time   CHOL 154 12/16/2018 06:29 AM   TRIG 74 12/16/2018 06:29 AM   HDL 35 (L) 12/16/2018 06:29 AM    CHOLHDL 4.4 12/16/2018 06:29 AM   LDLCALC 104 (H) 12/16/2018 06:29 AM    Wt Readings from Last 3 Encounters:  01/23/19 182 lb 12.8 oz (82.9 kg)  01/14/19 182 lb 9.6 oz (82.8 kg)  01/01/19 184 lb 12.8 oz (83.8 kg)     Objective:    Vital Signs:  BP 119/83   Pulse 71   Ht 4\' 11"  (1.499 m)   Wt 182 lb 12.8 oz (82.9 kg)   BMI 36.92 kg/m    Well nourished, well developed female in no acute distress. Moderate obesity is noted.  No obvious neck vein distention is noted.  Respiratory pattern is normal.  There is no peripheral edema.  Skin color is normal/pink without evidence of cyanosis.  ASSESSMENT & PLAN:    1. Angina pectoris ([place])   2. CAD S/P DES PCI -pLAD   3. Hyperlipidemia with target LDL less than 70   4. Morbid obesity ([place])    PLAN in order  of problem: 1. She is hesitant to use nitroglycerin if episodes of chest discomfort.  She has been purposefully avoiding nitroglycerin but luckily has not had any chest discomfort that is similar to her presenting complaint.  Her only current symptom is exertional dyspnea which could be an ischemic equivalent.  This happens when she is out walking in the neighborhood, particularly up an incline, when heart rate gets above 115 bpm.  She has to stop and rest.  She recovers quickly.  No lingering effect.  Has not used nitroglycerin for such.  I agree with not using nitroglycerin in this situation. 2. She received proximal LAD stent.  She has ostial LAD disease in the 50 to 70% range. 3. We reviewed the most recent lipid panel, performed on the day of her acute myocardial infarction.  The LDL on that occasion was 104.  Total cholesterol 154.  Currently taking high intensity statin therapy and will repeat lipid panel in mid April along with a liver panel.  Target is less than 70. 4. Goal is weight loss which will be facilitated by physical activity under secondary prevention guidelines as discussed below.  Consider screening for sleep apnea.   Aerobic activity greater than 150 minutes/week was emphasized.  Overall education and awareness concerning secondary risk prevention was discussed in detail: LDL less than 70, hemoglobin A1c less than 7, blood pressure target less than 130/80 mmHg, >150 minutes of moderate aerobic activity per week, avoidance of smoking, weight control (via diet and exercise), and continued surveillance/management of/for obstructive sleep apnea.   COVID-19 Education: The signs and symptoms of COVID-19 were discussed with the patient and how to seek care for testing (follow up with PCP or arrange E-visit).  The importance of social distancing was discussed today.  Time:   Today, I have spent 20 minutes with the patient with telehealth technology discussing the above problems.     Medication Adjustments/Labs and Tests Ordered: Current medicines are reviewed at length with the patient today.  Concerns regarding medicines are outlined above.  Tests Ordered: Orders Placed This Encounter  Procedures  . Hepatic function panel  . Lipid panel   Medication Changes: Meds ordered this encounter  Medications  . carvedilol (COREG) 12.5 MG tablet    Sig: Take 1 tablet (12.5 mg total) by mouth 2 (two) times daily.    Dispense:  180 tablet    Refill:  3    Dose change    Disposition:  Follow up in 3 month(s)  Signed, Sinclair Grooms, MD  01/23/2019 10:45 AM    [place]

## 2019-01-24 ENCOUNTER — Encounter: Payer: Self-pay | Admitting: *Deleted

## 2019-01-24 NOTE — Telephone Encounter (Signed)
To whom it may concern,  Mrs. [name] (date of birth June 30, 1966) will not be able to return to work until February 11, 2019.  She is recuperating from a recent heart attack.  If there are questions or concerns, do not hesitate to contact me.      Sincerely,     Illene Labrador, III, MD

## 2019-01-24 NOTE — Telephone Encounter (Signed)
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  She is working on doing cardiac rehab style workouts at home.  She states she feels like she could possibly go back to work the last week of April if Dr. Tamala Julian is agreeable.

## 2019-01-24 NOTE — Telephone Encounter (Signed)
Spoke with pt and made her aware that letter has been prepared.  Advised I will send to medical records to see if we can have faxed over to disability office.  Lorriane Shire or Janett Billow,   The fax number and person to send this letter to is in the original note from me.  I have typed the letter into the letter tab.  Can we please send that over?  Her disability runs out Monday.  Thanks

## 2019-01-25 NOTE — Telephone Encounter (Signed)
Letter faxed to [facility], ATTNMagda Paganini on 01/24/19 to fax# [phone]

## 2019-01-25 NOTE — Telephone Encounter (Signed)
[name] taking care of

## 2019-01-29 ENCOUNTER — Encounter: Payer: Self-pay | Admitting: Nurse Practitioner

## 2019-02-05 DIAGNOSIS — Z1231 Encounter for screening mammogram for malignant neoplasm of breast: Secondary | ICD-10-CM | POA: Diagnosis not present

## 2019-02-05 DIAGNOSIS — Z6836 Body mass index (BMI) 36.0-36.9, adult: Secondary | ICD-10-CM | POA: Diagnosis not present

## 2019-02-05 DIAGNOSIS — Z01419 Encounter for gynecological examination (general) (routine) without abnormal findings: Secondary | ICD-10-CM | POA: Diagnosis not present

## 2019-02-06 ENCOUNTER — Other Ambulatory Visit: Payer: Self-pay | Admitting: *Deleted

## 2019-02-06 ENCOUNTER — Other Ambulatory Visit: Payer: Self-pay

## 2019-02-06 ENCOUNTER — Other Ambulatory Visit: Payer: 59 | Admitting: *Deleted

## 2019-02-06 DIAGNOSIS — Z9851 Tubal ligation status: Secondary | ICD-10-CM

## 2019-02-06 DIAGNOSIS — E785 Hyperlipidemia, unspecified: Secondary | ICD-10-CM

## 2019-02-06 DIAGNOSIS — N926 Irregular menstruation, unspecified: Secondary | ICD-10-CM

## 2019-02-06 LAB — LIPID PANEL
Chol/HDL Ratio: 2.4 ratio (ref 0.0–4.4)
Cholesterol, Total: 80 mg/dL — ABNORMAL LOW (ref 100–199)
HDL: 33 mg/dL — ABNORMAL LOW (ref >39)
LDL Calculated: 32 mg/dL (ref 0–99)
Triglycerides: 75 mg/dL (ref 0–149)
VLDL Cholesterol Cal: 15 mg/dL (ref 5–40)

## 2019-02-06 LAB — HEPATIC FUNCTION PANEL
ALT: 16 IU/L (ref 0–32)
AST: 15 IU/L (ref 0–40)
Albumin: 3.9 g/dL (ref 3.8–4.8)
Alkaline Phosphatase: 118 IU/L — ABNORMAL HIGH (ref 39–117)
Bilirubin Total: 0.9 mg/dL (ref 0.0–1.2)
Bilirubin, Direct: 0.23 mg/dL (ref 0.00–0.40)
Total Protein: 6.2 g/dL (ref 6.0–8.5)

## 2019-02-06 LAB — FOLLICLE STIMULATING HORMONE: FSH: 18 m[IU]/mL

## 2019-02-08 ENCOUNTER — Encounter: Payer: Self-pay | Admitting: *Deleted

## 2019-02-12 ENCOUNTER — Telehealth (HOSPITAL_COMMUNITY): Payer: Self-pay | Admitting: *Deleted

## 2019-02-15 ENCOUNTER — Encounter: Payer: Self-pay | Admitting: *Deleted

## 2019-03-04 MED FILL — CARVEDILOL 12.5 MG TABLET: 12.5 | 90 days supply | Qty: 180 | Fill #0

## 2019-03-20 ENCOUNTER — Ambulatory Visit (INDEPENDENT_AMBULATORY_CARE_PROVIDER_SITE_OTHER): Payer: 59 | Admitting: Physician Assistant

## 2019-03-20 ENCOUNTER — Encounter: Payer: Self-pay | Admitting: *Deleted

## 2019-03-20 ENCOUNTER — Telehealth: Payer: Self-pay | Admitting: *Deleted

## 2019-03-20 ENCOUNTER — Other Ambulatory Visit: Payer: Self-pay

## 2019-03-20 ENCOUNTER — Encounter: Payer: Self-pay | Admitting: Physician Assistant

## 2019-03-20 VITALS — BP 118/70 | HR 70 | Ht 59.0 in | Wt 184.0 lb

## 2019-03-20 DIAGNOSIS — I25119 Atherosclerotic heart disease of native coronary artery with unspecified angina pectoris: Secondary | ICD-10-CM | POA: Diagnosis not present

## 2019-03-20 DIAGNOSIS — E785 Hyperlipidemia, unspecified: Secondary | ICD-10-CM

## 2019-03-20 DIAGNOSIS — I10 Essential (primary) hypertension: Secondary | ICD-10-CM | POA: Diagnosis not present

## 2019-03-20 NOTE — Patient Instructions (Signed)
Medication Instructions:   Your physician recommends that you continue on your current medications as directed. Please refer to the Current Medication list given to you today.   If you need a refill on your cardiac medications before your next appointment, please call your pharmacy.   Lab work:  Art gallery manager and cbc today   If you have labs (blood work) drawn today and your tests are completely normal, you will receive your results only by: Marland Kitchen MyChart Message (if you have MyChart) OR . A paper copy in the mail If you have any lab test that is abnormal or we need to change your treatment, we will call you to review the results.  Testing/Procedures: Your physician has requested that you have a cardiac catheterization. Cardiac catheterization is used to diagnose and/or treat various heart conditions. Doctors may recommend this procedure for a number of different reasons. The most common reason is to evaluate chest pain. Chest pain can be a symptom of coronary artery disease (CAD), and cardiac catheterization can show whether plaque is narrowing or blocking your heart's arteries. This procedure is also used to evaluate the valves, as well as measure the blood flow and oxygen levels in different parts of your heart. For further information please visit [email or web address]. Please follow instruction sheet, as given.   Follow-Up: 2 weeks post cath virtual follow up     Any Other Special Instructions Will Be Listed Below (If Applicable).

## 2019-03-20 NOTE — Telephone Encounter (Signed)
S/w pt is aware to come to office to for appt with Scott.  Asked screening questions and also advised with Clinical Manager, Janan Halter, RN.  Pt was around Network engineer at work who was on vacation, the cousin who the Network engineer was on vacation with had covid, Network engineer also had covid but  asymptomatic.  Secretary did Theme park manager for 14 days.  Pt is asymptomatic.  Claiborne Billings stated pt is ok to come in office.

## 2019-03-20 NOTE — H&P (View-Only) (Signed)
Cardiology Office Note:    Date:  03/20/2019   ID:  [name], DOB September 26, 1966, MRN [id number]  PCP:  Taylor Housekeeper, MD  Cardiologist:  Sinclair Grooms, MD   Electrophysiologist:  None   Referring MD: Taylor Housekeeper, MD   Chief Complaint  Patient presents with   Chest Pain    History of Present Illness:    [name] is a 53 y.o. female with coronary artery disease, hyperlipidemia.  She was admitted 3/1-3/3 with an anterior NSTEMI.  Cardiac Catheterization demonstrated ostial LAD 70% and proximal LAD 99% stenoses.  The proximal LAD was tx with a DES.  The ostial LAD would be difficult to treat with PCI as it would involve the LM.  EF was initially 35-45 on LV-gram but improved to normal on Echo.  She was last seen in follow up with Dr. Tamala [name] on 01/23/2019.  She did continue to note dyspnea on exertion.  She called in today with chest pain and was added on to my schedule.    Ms. [name] returns for the evaluation of chest pain.  She was doing well and able to walk 2 miles a day.  However, over the past 1 week, she has been having exertional chest tightness with associated shortness of breath.  She now has to stop often and cannot finish her 2 mile walk. She has not had symptoms at rest. She has started to feel like her symptoms are reminiscent of her symptoms leading up to her MI.  She has not had orthopnea, paroxysmal nocturnal dyspnea, edema, syncope.  She has not had fever, cough.    Prior CV studies:   The following studies were reviewed today:  Echo 12/17/2018 EF 55-60, mild basal septal hypertrophy  Cardiac Catheterization 12/16/2018 LAD ost 70, prox 99 EF 35-45 PCI:  3 x 16 mm Synergy DES to the proximal LAD Ostial LAD lesion will be difficult to treat with PCI as PCI would involve the LM         Past Medical History:  Diagnosis Date   Acute non-ST elevation myocardial infarction (NSTEMI) ([name]) 12/16/2018   CAD S/P DES PCI -pLAD 12/16/2018   Cath 12/15/2017:  Successful proximal LAD synergy 3.0 x 15 DES postdilated to 3.25 mm reducing the stenosis to 0% ; ? 60-70% ostial LAD noted on some images.   Surgical Hx: The patient  has a past surgical history that includes Cesarean section; Laparoscopic tubal ligation (Bilateral, 05/16/2013); IUD removal (N/A, 05/16/2013); Coronary/Graft Acute MI Revascularization (N/A, 12/16/2018); and LEFT HEART CATH AND CORONARY ANGIOGRAPHY (N/A, 12/16/2018).   Current Medications: Current Meds  Medication Sig   aspirin 81 MG chewable tablet Chew 1 tablet (81 mg total) by mouth daily.   atorvastatin (LIPITOR) 80 MG tablet Take 1 tablet (80 mg total) by mouth daily at 6 PM.   carvedilol (COREG) 12.5 MG tablet Take 1 tablet (12.5 mg total) by mouth 2 (two) times daily.   losartan (COZAAR) 25 MG tablet Take 1 tablet (25 mg total) by mouth daily.   nitroGLYCERIN (NITROSTAT) 0.4 MG SL tablet Place 1 tablet (0.4 mg total) under the tongue every 5 (five) minutes as needed for chest pain.   ticagrelor (BRILINTA) 90 MG TABS tablet Take 1 tablet (90 mg total) by mouth 2 (two) times daily.     Allergies:   Tetracycline   Social History   Tobacco Use   Smoking status: Never Smoker   Smokeless tobacco: Never Used  Substance Use  Topics   Alcohol use: Yes    Comment:  once a month   Drug use: No     Family Hx: The patient's family history includes CAD in her mother; Cancer in her paternal grandfather; Colon cancer in her father and another family member; Heart attack in her maternal grandfather, mother, and another family member; Heart disease in an other family member; Heart failure in her maternal grandmother; High blood pressure in her mother; Hyperlipidemia in her mother and sister.  ROS:   Please see the history of present illness.    ROS All other systems reviewed and are negative.   EKGs/Labs/Other Test Reviewed:    EKG:  EKG is   ordered today.  The ekg ordered today demonstrates normal sinus rhythm, HR  64,normal axis, no ST-TW changes, QTc 420  Recent Labs: 12/16/2018: B Natriuretic Peptide 23.5; TSH 4.499 01/01/2019: BUN 14; Creatinine, Ser 0.83; Hemoglobin 13.4; Platelets 359; Potassium 4.8; Sodium 143 02/06/2019: ALT 16   Recent Lipid Panel Lab Results  Component Value Date/Time   CHOL 80 (L) 02/06/2019 09:55 AM   TRIG 75 02/06/2019 09:55 AM   HDL 33 (L) 02/06/2019 09:55 AM   CHOLHDL 2.4 02/06/2019 09:55 AM   CHOLHDL 4.4 12/16/2018 06:29 AM   LDLCALC 32 02/06/2019 09:55 AM    Physical Exam:    VS:  BP 118/70    Pulse 70    Ht 4\' 11"  (1.499 m)    Wt 184 lb (83.5 kg)    SpO2 99%    BMI 37.16 kg/m     Wt Readings from Last 3 Encounters:  03/20/19 184 lb (83.5 kg)  01/23/19 182 lb 12.8 oz (82.9 kg)  01/14/19 182 lb 9.6 oz (82.8 kg)     Physical Exam  Constitutional: She is oriented to person, place, and time. She appears well-developed and well-nourished. No distress.  HENT:  Head: Normocephalic and atraumatic.  Eyes: No scleral icterus.  Neck: Neck supple. No JVD present. No thyromegaly present.  Cardiovascular: Normal rate, regular rhythm, S1 normal, S2 normal and normal heart sounds.  No murmur heard. Pulmonary/Chest: Effort normal. She has no rales.  Abdominal: Soft. There is no hepatomegaly.  Musculoskeletal:        General: No edema.  Neurological: She is alert and oriented to person, place, and time.  Skin: Skin is warm and dry.  Psychiatric: She has a normal mood and affect.    ASSESSMENT & PLAN:    Coronary artery disease involving native coronary artery of native heart with angina pectoris ([place]) Hx of MI in 12/2018.  She underwent PCI with DES to the pLAD.  She did have residual oLAD 70% stenosis that was treated medically.   PCI would be difficult as it would involve the LM.  She now has CCS Class 3 angina.  She is already on maximally tolerated beta-blocker. She is unable to tolerate nitrates due to headache.  She does not have enough blood pressure to start  Amlodipine.  She may be a candidate for high risk PCI of the ostial LAD or may need 1v CABG.  I have recommended proceeding with Cardiac Catheterization.  I reviewed this with Dr. Johnsie [name] (attending MD) who agreed.  Risks and benefits of cardiac catheterization have been discussed with the patient.  These include bleeding, infection, kidney damage, stroke, heart attack, death.  The patient understands these risks and is willing to proceed.    -Schedule LHC with Dr. Tamala [name] next week  -BMET, CBC  -  Covid-19 test  -FU 2 weeks  Hyperlipidemia with target LDL less than 70 LDL optimal on most recent lab work.  Continue current Rx.    Essential hypertension The patient's blood pressure is controlled on her current regimen.  Continue current therapy.    Dispo:  Return in about 2 weeks (around 04/03/2019) for Post Procedure Follow Up w/ Dr. Tamala [name], or PA/NP.   Medication Adjustments/Labs and Tests Ordered: Current medicines are reviewed at length with the patient today.  Concerns regarding medicines are outlined above.  Tests Ordered: Orders Placed This Encounter  Procedures   CBC   Basic metabolic panel   EKG 44-YJEH   Medication Changes: No orders of the defined types were placed in this encounter.   Signed, Richardson Dopp, PA-C  03/20/2019 5:24 PM    [facility] [address], [place], [place]  [address] Phone: [phone]; Fax: [phone]   Patient examined chart reviewed discussed care with patient and PA as DOD in office She describes recurrent angina with known ostial LAD residual stenosis post proximal LAD stent. Discussed cath and possible need for CABG if distal LM or ostial LAD is worse As this area is not amenable to stenting Previous right radial cath sight healed well and good Pulse  Jenkins Rouge

## 2019-03-20 NOTE — Telephone Encounter (Signed)
lvm for pt to let pt know needs a ov visit today with Richardson Dopp, PA, either at 3:15 or 3:45 per Cecille Rubin.  Will give screening questions for COVID when pt calls back.  Also left a mychart message.      COVID-19 Pre-Screening Questions:  . In the past 7 to 10 days have you had a cough,  shortness of breath, headache, congestion, fever (100 or greater) body aches, chills, sore throat, or sudden loss of taste or sense of smell? . Have you been around anyone with known Covid 19. . Have you been around anyone who is awaiting Covid 19 test results in the past 7 to 10 days? . Have you been around anyone who has been exposed to Covid 19, or has mentioned symptoms of Covid 19 within the past 7 to 10 days?  If you have any concerns/questions about symptoms patients report during screening (either on the phone or at threshold). Contact the provider seeing the patient or DOD for further guidance.  If neither are available contact a member of the leadership team.

## 2019-03-20 NOTE — Progress Notes (Addendum)
Cardiology Office Note:    Date:  03/20/2019   ID:  [name], DOB 03-30-1966, MRN [id number]  PCP:  [name], MD  Cardiologist:  Sinclair Grooms, MD   Electrophysiologist:  None   Referring MD: [name], MD   Chief Complaint  Patient presents with   Chest Pain    History of Present Illness:    [name] is a 53 y.o. female with coronary artery disease, hyperlipidemia.  She was admitted 3/1-3/3 with an anterior NSTEMI.  Cardiac Catheterization demonstrated ostial LAD 70% and proximal LAD 99% stenoses.  The proximal LAD was tx with a DES.  The ostial LAD would be difficult to treat with PCI as it would involve the LM.  EF was initially 35-45 on LV-gram but improved to normal on Echo.  She was last seen in follow up with Dr. Tamala [name] on 01/23/2019.  She did continue to note dyspnea on exertion.  She called in today with chest pain and was added on to my schedule.    Ms. [name] returns for the evaluation of chest pain.  She was doing well and able to walk 2 miles a day.  However, over the past 1 week, she has been having exertional chest tightness with associated shortness of breath.  She now has to stop often and cannot finish her 2 mile walk. She has not had symptoms at rest. She has started to feel like her symptoms are reminiscent of her symptoms leading up to her MI.  She has not had orthopnea, paroxysmal nocturnal dyspnea, edema, syncope.  She has not had fever, cough.    Prior CV studies:   The following studies were reviewed today:  Echo 12/17/2018 EF 55-60, mild basal septal hypertrophy  Cardiac Catheterization 12/16/2018 LAD ost 70, prox 99 EF 35-45 PCI:  3 x 16 mm Synergy DES to the proximal LAD Ostial LAD lesion will be difficult to treat with PCI as PCI would involve the LM         Past Medical History:  Diagnosis Date   Acute non-ST elevation myocardial infarction (NSTEMI) ([place]) 12/16/2018   CAD S/P DES PCI -pLAD 12/16/2018   Cath 12/15/2017:  Successful proximal LAD synergy 3.0 x 15 DES postdilated to 3.25 mm reducing the stenosis to 0% ; ? 60-70% ostial LAD noted on some images.   Surgical Hx: The patient  has a past surgical history that includes Cesarean section; Laparoscopic tubal ligation (Bilateral, 05/16/2013); IUD removal (N/A, 05/16/2013); Coronary/Graft Acute MI Revascularization (N/A, 12/16/2018); and LEFT HEART CATH AND CORONARY ANGIOGRAPHY (N/A, 12/16/2018).   Current Medications: Current Meds  Medication Sig   aspirin 81 MG chewable tablet Chew 1 tablet (81 mg total) by mouth daily.   atorvastatin (LIPITOR) 80 MG tablet Take 1 tablet (80 mg total) by mouth daily at 6 PM.   carvedilol (COREG) 12.5 MG tablet Take 1 tablet (12.5 mg total) by mouth 2 (two) times daily.   losartan (COZAAR) 25 MG tablet Take 1 tablet (25 mg total) by mouth daily.   nitroGLYCERIN (NITROSTAT) 0.4 MG SL tablet Place 1 tablet (0.4 mg total) under the tongue every 5 (five) minutes as needed for chest pain.   ticagrelor (BRILINTA) 90 MG TABS tablet Take 1 tablet (90 mg total) by mouth 2 (two) times daily.     Allergies:   Tetracycline   Social History   Tobacco Use   Smoking status: Never Smoker   Smokeless tobacco: Never Used  Substance Use  Topics   Alcohol use: Yes    Comment:  once a month   Drug use: No     Family Hx: The patient's family history includes CAD in her mother; Cancer in her paternal grandfather; Colon cancer in her father and another family member; Heart attack in her maternal grandfather, mother, and another family member; Heart disease in an other family member; Heart failure in her maternal grandmother; High blood pressure in her mother; Hyperlipidemia in her mother and sister.  ROS:   Please see the history of present illness.    ROS All other systems reviewed and are negative.   EKGs/Labs/Other Test Reviewed:    EKG:  EKG is   ordered today.  The ekg ordered today demonstrates normal sinus rhythm, HR  64,normal axis, no ST-TW changes, QTc 420  Recent Labs: 12/16/2018: B Natriuretic Peptide 23.5; TSH 4.499 01/01/2019: BUN 14; Creatinine, Ser 0.83; Hemoglobin 13.4; Platelets 359; Potassium 4.8; Sodium 143 02/06/2019: ALT 16   Recent Lipid Panel Lab Results  Component Value Date/Time   CHOL 80 (L) 02/06/2019 09:55 AM   TRIG 75 02/06/2019 09:55 AM   HDL 33 (L) 02/06/2019 09:55 AM   CHOLHDL 2.4 02/06/2019 09:55 AM   CHOLHDL 4.4 12/16/2018 06:29 AM   LDLCALC 32 02/06/2019 09:55 AM    Physical Exam:    VS:  BP 118/70    Pulse 70    Ht 4\' 11"  (1.499 m)    Wt 184 lb (83.5 kg)    SpO2 99%    BMI 37.16 kg/m     Wt Readings from Last 3 Encounters:  03/20/19 184 lb (83.5 kg)  01/23/19 182 lb 12.8 oz (82.9 kg)  01/14/19 182 lb 9.6 oz (82.8 kg)     Physical Exam  Constitutional: She is oriented to person, place, and time. She appears well-developed and well-nourished. No distress.  HENT:  Head: Normocephalic and atraumatic.  Eyes: No scleral icterus.  Neck: Neck supple. No JVD present. No thyromegaly present.  Cardiovascular: Normal rate, regular rhythm, S1 normal, S2 normal and normal heart sounds.  No murmur heard. Pulmonary/Chest: Effort normal. She has no rales.  Abdominal: Soft. There is no hepatomegaly.  Musculoskeletal:        General: No edema.  Neurological: She is alert and oriented to person, place, and time.  Skin: Skin is warm and dry.  Psychiatric: She has a normal mood and affect.    ASSESSMENT & PLAN:    Coronary artery disease involving native coronary artery of native heart with angina pectoris ([place]) Hx of MI in 12/2018.  She underwent PCI with DES to the pLAD.  She did have residual oLAD 70% stenosis that was treated medically.   PCI would be difficult as it would involve the LM.  She now has CCS Class 3 angina.  She is already on maximally tolerated beta-blocker. She is unable to tolerate nitrates due to headache.  She does not have enough blood pressure to start  Amlodipine.  She may be a candidate for high risk PCI of the ostial LAD or may need 1v CABG.  I have recommended proceeding with Cardiac Catheterization.  I reviewed this with Dr. Johnsie Cancel (attending MD) who agreed.  Risks and benefits of cardiac catheterization have been discussed with the patient.  These include bleeding, infection, kidney damage, stroke, heart attack, death.  The patient understands these risks and is willing to proceed.    -Schedule LHC with Dr. Tamala [name] next week  -BMET, CBC  -  Covid-19 test  -FU 2 weeks  Hyperlipidemia with target LDL less than 70 LDL optimal on most recent lab work.  Continue current Rx.    Essential hypertension The patient's blood pressure is controlled on her current regimen.  Continue current therapy.    Dispo:  Return in about 2 weeks (around 04/03/2019) for Post Procedure Follow Up w/ Dr. Tamala [name], or PA/NP.   Medication Adjustments/Labs and Tests Ordered: Current medicines are reviewed at length with the patient today.  Concerns regarding medicines are outlined above.  Tests Ordered: Orders Placed This Encounter  Procedures   CBC   Basic metabolic panel   EKG 14-HFWY   Medication Changes: No orders of the defined types were placed in this encounter.   Signed, Richardson Dopp, PA-C  03/20/2019 5:24 PM    [facility] [place], [place], [place]  [address] Phone: [phone]; Fax: [phone]   Patient examined chart reviewed discussed care with patient and PA as DOD in office She describes recurrent angina with known ostial LAD residual stenosis post proximal LAD stent. Discussed cath and possible need for CABG if distal LM or ostial LAD is worse As this area is not amenable to stenting Previous right radial cath sight healed well and good Pulse  Jenkins Rouge

## 2019-03-20 NOTE — Telephone Encounter (Signed)
Follow up  ° ° °Pt is returning call  ° ° °Please call back  °

## 2019-03-21 ENCOUNTER — Telehealth: Payer: Self-pay

## 2019-03-21 ENCOUNTER — Inpatient Hospital Stay (HOSPITAL_COMMUNITY): Admission: RE | Admit: 2019-03-21 | Payer: 59 | Source: Ambulatory Visit

## 2019-03-21 LAB — BASIC METABOLIC PANEL
BUN/Creatinine Ratio: 22 (ref 9–23)
BUN: 16 mg/dL (ref 6–24)
CO2: 22 mmol/L (ref 20–29)
Calcium: 9.5 mg/dL (ref 8.7–10.2)
Chloride: 105 mmol/L (ref 96–106)
Creatinine, Ser: 0.72 mg/dL (ref 0.57–1.00)
GFR calc Af Amer: 113 mL/min/{1.73_m2} (ref >59)
GFR calc non Af Amer: 98 mL/min/{1.73_m2} (ref >59)
Glucose: 91 mg/dL (ref 65–99)
Potassium: 4.6 mmol/L (ref 3.5–5.2)
Sodium: 141 mmol/L (ref 134–144)

## 2019-03-21 LAB — CBC
Hematocrit: 36.5 % (ref 34.0–46.6)
Hemoglobin: 11.9 g/dL (ref 11.1–15.9)
MCH: 31.4 pg (ref 26.6–33.0)
MCHC: 32.6 g/dL (ref 31.5–35.7)
MCV: 96 fL (ref 79–97)
Platelets: 366 10*3/uL (ref 150–450)
RBC: 3.79 x10E6/uL (ref 3.77–5.28)
RDW: 12.9 % (ref 11.7–15.4)
WBC: 12.1 10*3/uL — ABNORMAL HIGH (ref 3.4–10.8)

## 2019-03-21 NOTE — Telephone Encounter (Signed)
Notes recorded by Frederik Schmidt, RN on 03/21/2019 at 12:43 PM EDT The patient has been notified of the result and verbalized understanding. All questions (if any) were answered. Frederik Schmidt, RN 03/21/2019 12:43 PM

## 2019-03-21 NOTE — Telephone Encounter (Signed)
-----   Message from Liliane Shi, [place] sent at 03/21/2019 12:37 PM EDT ----- Hgb, Creatinine, K+ normal. Recommendations:  - Continue current medications and follow up as planned.  Richardson Dopp, PA-C    03/21/2019 12:37 PM

## 2019-03-22 ENCOUNTER — Other Ambulatory Visit (HOSPITAL_COMMUNITY)
Admission: RE | Admit: 2019-03-22 | Discharge: 2019-03-22 | Disposition: A | Discharge status: Home / Self Care | Payer: 59 | Source: Ambulatory Visit | Attending: Interventional Cardiology | Admitting: Interventional Cardiology

## 2019-03-22 ENCOUNTER — Other Ambulatory Visit: Payer: Self-pay

## 2019-03-22 DIAGNOSIS — Z1159 Encounter for screening for other viral diseases: Secondary | ICD-10-CM | POA: Insufficient documentation

## 2019-03-22 DIAGNOSIS — Z01812 Encounter for preprocedural laboratory examination: Secondary | ICD-10-CM | POA: Diagnosis not present

## 2019-03-23 LAB — NOVEL CORONAVIRUS, NAA (HOSP ORDER, SEND-OUT TO REF LAB; TAT 18-24 HRS): SARS-CoV-2, NAA: NOT DETECTED

## 2019-03-25 ENCOUNTER — Telehealth: Payer: Self-pay | Admitting: *Deleted

## 2019-03-25 NOTE — Telephone Encounter (Signed)
Pt contacted pre-catheterization scheduled at [facility] for: Tuesday March 26, 2019 7:30 AM Verified arrival time and place: [facility] Entrance A at: 5:30 AM  Covid-19 test date: 03/21/18 [facility] not detected  No solid food after midnight prior to cath, clear liquids until 5 AM day of procedure. Contrast allergy: no  AM meds can be  taken pre-cath with sip of water including: ASA 81 mg Brilinta 90 mg  Confirmed patient has responsible person to drive home post procedure and observe 24 hours after arriving home: yes  Due to Covid-19 pandemic no visitors are allowed in the hospital (unless cognitive impairment).  Their designated party will be called when their procedure is over for an update and to arrange pick up.  Patients are required to wear a mask when they enter the hospital.       COVID-19 Pre-Screening Questions:  . In the past 7 to 10 days have you had a cough,  shortness of breath, headache, congestion, fever (100 or greater) body aches, chills, sore throat, or sudden loss of taste or sense of smell? no . Have you been around anyone with known Covid 19? no . Have you been around anyone who is awaiting Covid 19 test results in the past 7 to 10 days? no . Have you been around anyone who has been exposed to Covid 19, or has mentioned symptoms of Covid 19 within the past 7 to 10 days? no   I reviewed procedure/mask/visitor/Covid 19 screening questions with patient, she verbalized understanding.

## 2019-03-26 ENCOUNTER — Other Ambulatory Visit: Payer: Self-pay

## 2019-03-26 ENCOUNTER — Ambulatory Visit (HOSPITAL_COMMUNITY)
Admission: RE | Admit: 2019-03-26 | Discharge: 2019-03-26 | Disposition: A | Discharge status: Home / Self Care | Payer: 59 | Attending: Interventional Cardiology | Admitting: Interventional Cardiology

## 2019-03-26 ENCOUNTER — Encounter (HOSPITAL_COMMUNITY)
Admission: RE | Disposition: A | Discharge status: Home / Self Care | Payer: Self-pay | Source: Home / Self Care | Attending: Interventional Cardiology

## 2019-03-26 ENCOUNTER — Encounter (HOSPITAL_COMMUNITY): Payer: Self-pay | Admitting: Interventional Cardiology

## 2019-03-26 DIAGNOSIS — R0609 Other forms of dyspnea: Secondary | ICD-10-CM | POA: Insufficient documentation

## 2019-03-26 DIAGNOSIS — Z955 Presence of coronary angioplasty implant and graft: Secondary | ICD-10-CM | POA: Insufficient documentation

## 2019-03-26 DIAGNOSIS — I25110 Atherosclerotic heart disease of native coronary artery with unstable angina pectoris: Secondary | ICD-10-CM | POA: Diagnosis not present

## 2019-03-26 DIAGNOSIS — E785 Hyperlipidemia, unspecified: Secondary | ICD-10-CM | POA: Diagnosis present

## 2019-03-26 DIAGNOSIS — Z9851 Tubal ligation status: Secondary | ICD-10-CM | POA: Diagnosis not present

## 2019-03-26 DIAGNOSIS — Z8249 Family history of ischemic heart disease and other diseases of the circulatory system: Secondary | ICD-10-CM | POA: Diagnosis not present

## 2019-03-26 DIAGNOSIS — Z7982 Long term (current) use of aspirin: Secondary | ICD-10-CM | POA: Diagnosis not present

## 2019-03-26 DIAGNOSIS — I10 Essential (primary) hypertension: Secondary | ICD-10-CM | POA: Diagnosis not present

## 2019-03-26 DIAGNOSIS — I252 Old myocardial infarction: Secondary | ICD-10-CM | POA: Insufficient documentation

## 2019-03-26 DIAGNOSIS — Z79899 Other long term (current) drug therapy: Secondary | ICD-10-CM | POA: Diagnosis not present

## 2019-03-26 DIAGNOSIS — I2510 Atherosclerotic heart disease of native coronary artery without angina pectoris: Secondary | ICD-10-CM

## 2019-03-26 DIAGNOSIS — I25119 Atherosclerotic heart disease of native coronary artery with unspecified angina pectoris: Secondary | ICD-10-CM

## 2019-03-26 DIAGNOSIS — Z9861 Coronary angioplasty status: Secondary | ICD-10-CM

## 2019-03-26 DIAGNOSIS — Z881 Allergy status to other antibiotic agents status: Secondary | ICD-10-CM | POA: Insufficient documentation

## 2019-03-26 DIAGNOSIS — I255 Ischemic cardiomyopathy: Secondary | ICD-10-CM | POA: Diagnosis present

## 2019-03-26 HISTORY — PX: LEFT HEART CATH AND CORONARY ANGIOGRAPHY: CATH118249

## 2019-03-26 SURGERY — LEFT HEART CATH AND CORONARY ANGIOGRAPHY
Anesthesia: LOCAL

## 2019-03-26 MED ORDER — VERAPAMIL HCL 2.5 MG/ML IV SOLN
INTRAVENOUS | Status: DC | PRN
  Administered 2019-03-26: 10 mL via INTRA_ARTERIAL

## 2019-03-26 MED ORDER — IOHEXOL 350 MG/ML SOLN
INTRAVENOUS | Status: DC | PRN
  Administered 2019-03-26: 70 mL via INTRA_ARTERIAL

## 2019-03-26 MED ORDER — ACETAMINOPHEN 325 MG PO TABS: 650.0000 mg | ORAL_TABLET | ORAL | Status: DC | PRN

## 2019-03-26 MED ORDER — SODIUM CHLORIDE 0.9% FLUSH: 3.0000 mL | INTRAVENOUS | Status: DC | PRN

## 2019-03-26 MED ORDER — SODIUM CHLORIDE 0.9 % IV SOLN: INTRAVENOUS | Status: DC

## 2019-03-26 MED ORDER — HEPARIN SODIUM (PORCINE) 1000 UNIT/ML IJ SOLN
INTRAMUSCULAR | Status: DC | PRN
  Administered 2019-03-26: 4500 [IU] via INTRAVENOUS

## 2019-03-26 MED ORDER — SODIUM CHLORIDE 0.9 % WEIGHT BASED INFUSION
3.0000 mL/kg/h | INTRAVENOUS | Status: AC
  Administered 2019-03-26: 3 mL/kg/h via INTRAVENOUS

## 2019-03-26 MED ORDER — SODIUM CHLORIDE 0.9 % IV SOLN: 250.0000 mL | INTRAVENOUS | Status: DC | PRN

## 2019-03-26 MED ORDER — ONDANSETRON HCL 4 MG/2ML IJ SOLN: 4.0000 mg | Freq: Four times a day (QID) | INTRAMUSCULAR | Status: DC | PRN

## 2019-03-26 MED ORDER — TICAGRELOR 90 MG PO TABS: 90.0000 mg | ORAL_TABLET | Freq: Two times a day (BID) | ORAL | Status: DC

## 2019-03-26 MED ORDER — MIDAZOLAM HCL 2 MG/2ML IJ SOLN
INTRAMUSCULAR | Status: AC
  Filled 2019-03-26: qty 2

## 2019-03-26 MED ORDER — HEPARIN (PORCINE) IN NACL 1000-0.9 UT/500ML-% IV SOLN
INTRAVENOUS | Status: AC
  Filled 2019-03-26: qty 1000

## 2019-03-26 MED ORDER — VERAPAMIL HCL 2.5 MG/ML IV SOLN
INTRAVENOUS | Status: AC
  Filled 2019-03-26: qty 2

## 2019-03-26 MED ORDER — FENTANYL CITRATE (PF) 100 MCG/2ML IJ SOLN
INTRAMUSCULAR | Status: AC
  Filled 2019-03-26: qty 2

## 2019-03-26 MED ORDER — MIDAZOLAM HCL 2 MG/2ML IJ SOLN
INTRAMUSCULAR | Status: DC | PRN
  Administered 2019-03-26: 1 mg via INTRAVENOUS
  Administered 2019-03-26: 0.5 mg via INTRAVENOUS

## 2019-03-26 MED ORDER — HYDRALAZINE HCL 20 MG/ML IJ SOLN: 10.0000 mg | INTRAMUSCULAR | Status: DC | PRN

## 2019-03-26 MED ORDER — SODIUM CHLORIDE 0.9% FLUSH: 3.0000 mL | Freq: Two times a day (BID) | INTRAVENOUS | Status: DC

## 2019-03-26 MED ORDER — ASPIRIN 81 MG PO CHEW: 81.0000 mg | CHEWABLE_TABLET | Freq: Every day | ORAL | Status: DC

## 2019-03-26 MED ORDER — LABETALOL HCL 5 MG/ML IV SOLN: 10.0000 mg | INTRAVENOUS | Status: DC | PRN

## 2019-03-26 MED ORDER — FENTANYL CITRATE (PF) 100 MCG/2ML IJ SOLN
INTRAMUSCULAR | Status: DC | PRN
  Administered 2019-03-26: 50 ug via INTRAVENOUS

## 2019-03-26 MED ORDER — LIDOCAINE HCL (PF) 1 % IJ SOLN
INTRAMUSCULAR | Status: AC
  Filled 2019-03-26: qty 30

## 2019-03-26 MED ORDER — ASPIRIN 81 MG PO CHEW: 81.0000 mg | CHEWABLE_TABLET | ORAL | Status: DC

## 2019-03-26 MED ORDER — HEPARIN (PORCINE) IN NACL 1000-0.9 UT/500ML-% IV SOLN
INTRAVENOUS | Status: DC | PRN
  Administered 2019-03-26 (×2): 500 mL

## 2019-03-26 MED ORDER — HEPARIN SODIUM (PORCINE) 1000 UNIT/ML IJ SOLN
INTRAMUSCULAR | Status: AC
  Filled 2019-03-26: qty 1

## 2019-03-26 MED ORDER — OXYCODONE HCL 5 MG PO TABS: 5.0000 mg | ORAL_TABLET | ORAL | Status: DC | PRN

## 2019-03-26 MED ORDER — SODIUM CHLORIDE 0.9 % WEIGHT BASED INFUSION: 1.0000 mL/kg/h | INTRAVENOUS | Status: DC

## 2019-03-26 MED ORDER — LIDOCAINE HCL (PF) 1 % IJ SOLN
INTRAMUSCULAR | Status: DC | PRN
  Administered 2019-03-26: 2 mL

## 2019-03-26 SURGICAL SUPPLY — 10 items
CATH 5FR JL3.5 JR4 ANG PIG MP (CATHETERS) ×2 IMPLANT
DEVICE RAD COMP TR BAND LRG (VASCULAR PRODUCTS) ×2 IMPLANT
GLIDESHEATH SLEND A-KIT 6F 22G (SHEATH) ×2 IMPLANT
GUIDEWIRE INQWIRE 1.5J.035X260 (WIRE) ×1 IMPLANT
INQWIRE 1.5J .035X260CM (WIRE) ×2
KIT HEART LEFT (KITS) ×2 IMPLANT
PACK CARDIAC CATHETERIZATION (CUSTOM PROCEDURE TRAY) ×2 IMPLANT
SHEATH PROBE COVER 6X72 (BAG) ×2 IMPLANT
TRANSDUCER W/STOPCOCK (MISCELLANEOUS) ×2 IMPLANT
TUBING CIL FLEX 10 FLL-RA (TUBING) ×2 IMPLANT

## 2019-03-26 NOTE — Discharge Instructions (Signed)
DRINK PLENTY OF FLUIDS FOR THE NEXT 2-3 DAYS TO KEEP HYDRATED.  Radial Site Care  This sheet gives you information about how to care for yourself after your procedure. Your health care provider may also give you more specific instructions. If you have problems or questions, contact your health care provider. What can I expect after the procedure? After the procedure, it is common to have:  Bruising and tenderness at the catheter insertion area. Follow these instructions at home: Medicines  Take over-the-counter and prescription medicines only as told by your health care provider. Insertion site care  Follow instructions from your health care provider about how to take care of your insertion site. Make sure you: ? Wash your hands with soap and water before you change your bandage (dressing). If soap and water are not available, use hand sanitizer. ? Change your dressing as told by your health care provider.  Check your insertion site every day for signs of infection. Check for: ? Redness, swelling, or pain. ? Fluid or blood. ? Pus or a bad smell. ? Warmth.  Do not take baths, swim, or use a hot tub for 5 days.  You may shower 24-48 hours after the procedure, or as directed by your health care provider. ? Remove the dressing and gently wash the site with plain soap and water. ? Pat the area dry with a clean towel. ? Do not rub the site. That could cause bleeding.  Do not apply powder or lotion to the site. Activity   For 24 hours after the procedure, or as directed by your health care provider: ? Do not flex or bend the affected arm. ? Do not push or pull heavy objects with the affected arm. ? Do not drive yourself home from the hospital or clinic. You may drive 24 hours after the procedure unless your health care provider tells you not to. ? Do not operate machinery or power tools.  Do not lift anything that is heavier than 10 lb (4.5 kg) for 5 days.  Ask your health  care provider when it is okay to: ? Return to work or school. ? Resume usual physical activities or sports. ? Resume sexual activity. General instructions  If the catheter site starts to bleed, raise your arm and put firm pressure on the site. If the bleeding does not stop, get help right away. This is a medical emergency.  If you went home on the same day as your procedure, a responsible adult should be with you for the first 24 hours after you arrive home.  Keep all follow-up visits as told by your health care provider. This is important. Contact a health care provider if:  You have a fever.  You have redness, swelling, or yellow drainage around your insertion site. Get help right away if:  You have unusual pain at the radial site.  The catheter insertion area swells very fast.  The insertion area is bleeding, and the bleeding does not stop when you hold steady pressure on the area.  Your arm or hand becomes pale, cool, tingly, or numb. These symptoms may represent a serious problem that is an emergency. Do not wait to see if the symptoms will go away. Get medical help right away. Call your local emergency services (911 in the [place] Do not drive yourself to the hospital. Summary  After the procedure, it is common to have bruising and tenderness at the site.  Follow instructions from your health care provider  about how to take care of your radial site wound. Check the wound every day for signs of infection.  Do not lift anything that is heavier than 10 lb (4.5 kg), or the limit that you are told, until your health care provider says that it is safe. This information is not intended to replace advice given to you by your health care provider. Make sure you discuss any questions you have with your health care provider. Document Released: 11/05/2010 Document Revised: 11/08/2017 Document Reviewed: 11/08/2017 Elsevier Interactive Patient Education  2019 [employer].

## 2019-03-26 NOTE — Interval H&P Note (Signed)
Cath Lab Visit (complete for each Cath Lab visit)  Clinical Evaluation Leading to the Procedure:   ACS: No.  Non-ACS:    Anginal Classification: CCS [name]  Anti-ischemic medical therapy: Maximal Therapy (2 or more classes of medications)  Non-Invasive Test Results: No non-invasive testing performed  Prior CABG: No previous CABG      History and Physical Interval Note:  03/26/2019 7:30 AM  [name]  has presented today for surgery, with the diagnosis of cad - angina.  The various methods of treatment have been discussed with the patient and family. After consideration of risks, benefits and other options for treatment, the patient has consented to  Procedure(s): LEFT HEART CATH AND CORONARY ANGIOGRAPHY (N/A) as a surgical intervention.  The patient's history has been reviewed, patient examined, no change in status, stable for surgery.  I have reviewed the patient's chart and labs.  Questions were answered to the patient's satisfaction.     [name]

## 2019-03-26 NOTE — Progress Notes (Signed)
TCTS consulted for CABG evaluation. °

## 2019-03-26 NOTE — Progress Notes (Signed)
[facility] BAND REMOVAL  LOCATION:    right radial  DEFLATED PER PROTOCOL:    Yes.    TIME BAND OFF / DRESSING APPLIED:    1000   SITE UPON ARRIVAL:    Level 0  SITE AFTER BAND REMOVAL:    Level 0  CIRCULATION SENSATION AND MOVEMENT:    Within Normal Limits   Yes.

## 2019-03-26 NOTE — CV Procedure (Signed)
   Diagnostic coronary angio via right radial. Real time vasc ultrasound used for radial access.  Ostial LAD 60 %, and prox. to mid 50%. Proximal stent is widely patent.  RCA, LM, and circumflex.  Plan : LIMA to LAD per Dr. Darcey Nora

## 2019-03-28 ENCOUNTER — Encounter: Payer: 59 | Admitting: Cardiothoracic Surgery

## 2019-03-28 ENCOUNTER — Other Ambulatory Visit: Payer: Self-pay

## 2019-03-29 ENCOUNTER — Encounter: Payer: Self-pay | Admitting: Cardiothoracic Surgery

## 2019-03-29 ENCOUNTER — Other Ambulatory Visit: Payer: Self-pay | Admitting: *Deleted

## 2019-03-29 ENCOUNTER — Institutional Professional Consult (permissible substitution) (INDEPENDENT_AMBULATORY_CARE_PROVIDER_SITE_OTHER): Payer: 59 | Admitting: Cardiothoracic Surgery

## 2019-03-29 VITALS — BP 104/72 | HR 72 | Temp 97.9°F | Resp 20 | Ht 59.0 in | Wt 184.0 lb

## 2019-03-29 DIAGNOSIS — I2510 Atherosclerotic heart disease of native coronary artery without angina pectoris: Secondary | ICD-10-CM

## 2019-03-29 NOTE — Progress Notes (Signed)
PCP is [name], MD Referring Provider is Belva Crome, MD  Chief Complaint  Patient presents with  . Coronary Artery Disease    Surgical eval, Cardiac Cath 03/26/19, ECHO 12/17/18  Patient examined, images of recent coronary angiogram personally reviewed and counseled with patient.    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  In March 2020 patient had an acute anterior MI with a 99% proximal LAD stenosis treated with PCI.  There was a residual ostial LAD stenosis but the patient responded clinically and LV function improved.  She is placed on Brilinta and follow-up.  She was able to exercise without angina until recently when she developed chest pain and pressure while going uphill on her neighborhood walks.  She underwent repeat catheterization by Dr. Tamala Julian.  The ostial LAD stenosis is now 80 to 90%.  PCI is patent.  There is a first diagonal branch of the LAD which is underperfused because of the PCI stent.  The patient denies symptoms of heart failure.  She is in sinus rhythm.  He has had minimal bleeding problems from the [facility].  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   Past Medical History:  Diagnosis Date  . Acute non-ST elevation myocardial infarction (NSTEMI) ([place]) 12/16/2018  . CAD S/P DES PCI -pLAD 12/16/2018   Cath 12/15/2017: Successful proximal LAD synergy 3.0 x 15 DES postdilated to 3.25 mm reducing the stenosis to 0% ; ? 60-70% ostial LAD noted on some images.    Past Surgical History:  Procedure Laterality Date  . CESAREAN SECTION    . CORONARY/GRAFT ACUTE MI REVASCULARIZATION N/A 12/16/2018   Procedure: Coronary/Graft Acute MI Revascularization;  Surgeon: Belva Crome, MD;  Location: [place] [facility];  Service: Cardiovascular;  Laterality: N/A;  . IUD REMOVAL N/A 05/16/2013   Procedure: INTRAUTERINE DEVICE (IUD) REMOVAL;  Surgeon: Darlyn Chamber, MD;  Location:  [facility];  Service: Gynecology;  Laterality: N/A;  . LAPAROSCOPIC TUBAL LIGATION Bilateral 05/16/2013   Procedure: LAPAROSCOPIC TUBAL LIGATION;  Surgeon: Darlyn Chamber, MD;  Location: [facility];  Service: Gynecology;  Laterality: Bilateral;  . LEFT HEART CATH AND CORONARY ANGIOGRAPHY N/A 12/16/2018   Procedure: LEFT HEART CATH AND CORONARY ANGIOGRAPHY;  Surgeon: Belva Crome, MD;  Location: [employer] [facility];  Service: Cardiovascular;  Laterality: N/A;  . LEFT HEART CATH AND CORONARY ANGIOGRAPHY N/A 03/26/2019   Procedure: LEFT HEART CATH AND CORONARY ANGIOGRAPHY;  Surgeon: Belva Crome, MD;  Location: [place] [facility];  Service: Cardiovascular;  Laterality: N/A;    Family History  Problem Relation Age of Onset  . Heart disease Other   . Colon cancer Other   . Heart attack Other   . Colon cancer Father   . High blood pressure Mother   . Heart attack Mother        2 stents  . CAD Mother   . Hyperlipidemia Mother   . Hyperlipidemia Sister   . Heart failure Maternal Grandmother   . Heart attack Maternal Grandfather        deceased 101  . Cancer Paternal Grandfather     Social History Social History   Tobacco Use  . Smoking status: Never Smoker  . Smokeless tobacco: Never Used  Substance Use Topics  . Alcohol use: Yes    Comment:  once a month  . Drug use: No  Current Outpatient Medications  Medication Sig Dispense Refill  . acetaminophen (TYLENOL) 325 MG tablet Take 650 mg by mouth every 6 (six) hours as needed.    Marland Kitchen aspirin 81 MG chewable tablet Chew 1 tablet (81 mg total) by mouth daily. 30 tablet 0  . atorvastatin (LIPITOR) 80 MG tablet Take 1 tablet (80 mg total) by mouth daily at 6 PM. 90 tablet 3  . carvedilol (COREG) 12.5 MG tablet Take 1 tablet (12.5 mg total) by mouth 2 (two) times daily. 180 tablet 3  . loratadine (CLARITIN) 10 MG tablet Take 10 mg by mouth daily as needed for allergies.    Marland Kitchen losartan (COZAAR) 25 MG tablet Take 1 tablet (25 mg total) by mouth daily.  90 tablet 3  . nitroGLYCERIN (NITROSTAT) 0.4 MG SL tablet Place 1 tablet (0.4 mg total) under the tongue every 5 (five) minutes as needed for chest pain. 25 tablet 0  . ticagrelor (BRILINTA) 90 MG TABS tablet Take 1 tablet (90 mg total) by mouth 2 (two) times daily. 180 tablet 3   No current facility-administered medications for this visit.     Allergies  Allergen Reactions  . Tetracycline Rash    Review of Systems                     Review of Systems :  [ y ] = yes, [  ] = no        General :  Weight gain [   ]    Weight loss  [   ]  Fatigue [ y ]  Fever [  ]  Chills  [  ]                                          HEENT    Headache [  ]  Dizziness [  ]  Blurred vision [  ] Glaucoma  [  ]                          Nosebleeds [  ] Painful or loose teeth [  ]        Cardiac :  Chest pain/ pressure Blue.Reese  ]  Resting SOB [  ] exertional SOB [ y ]                        Orthopnea [  ]  Pedal edema  [  ]  Palpitations [  ] Syncope/presyncope [ ]                         Paroxysmal nocturnal dyspnea [  ]         Pulmonary : cough [  ]  wheezing [  ]  Hemoptysis [  ] Sputum [  ] Snoring [  ]                              Pneumothorax [  ]  Sleep apnea [  ]        GI : Vomiting [  ]  Dysphagia [  ]  Melena  [  ]  Abdominal pain [  ] BRBPR [  ]  Heart burn [  ]  Constipation [  ] Diarrhea  [  ] Colonoscopy [   ]        GU : Hematuria [  ]  Dysuria [  ]  Nocturia [  ] UTI's [  ]        Vascular : Claudication [  ]  Rest pain [  ]  DVT [  ] Vein stripping [  ] leg ulcers [  ]                          TIA [  ] Stroke [  ]  Varicose veins [  ]        NEURO :  Headaches  [  ] Seizures [  ] Vision changes [  ] Paresthesias [  ]                                               Musculoskeletal :  Arthritis [  ] Gout  [  ]  Back pain [  ]  Joint pain [  ]        Skin :  Rash [  ]  Melanoma [  ] Sores [  ]        Heme : Bleeding problems [  ]Clotting Disorders [  ] Anemia [  ]Blood  Transfusion [ ]         Endocrine : Diabetes [  ] Heat or Cold intolerance [  ] Polyuria [  ]excessive thirst [ ]         Psych : Depression [  ]  Anxiety [  ]  Psych hospitalizations [  ] Memory change [  ]                                               BP 104/72   Pulse 72   Temp 97.9 F (36.6 C) (Skin)   Resp 20   Ht 4\' 11"  (1.499 m)   Wt 184 lb (83.5 kg)   SpO2 98% Comment: RA  BMI 37.16 kg/m  Physical Exam       Exam    General- alert and comfortable, overweight young female in no distress    Neck- no JVD, no cervical adenopathy palpable, no carotid bruit   Lungs- clear without rales, wheezes   Cor- regular rate and rhythm, no murmur , gallop   Abdomen- soft, non-tender   Extremities - warm, non-tender, minimal edema.  No hematoma at cardiac cath site right wrist   Neuro- oriented, appropriate, no focal weakness   Diagnostic Tests: Coronary angiogram show a significant ostial LAD stenosis.  The circumflex vessel is well perfused.  The first diagonal branch of the LAD is underperfused because of the previously placed PCI. EF is 50% with normal LVEDP   Impression: Symptomatic ostial LAD stenosis-class III angina with fairly well preserved LV function and history of recent anterior MI  Plan: Best long-term therapy for this patient would be placement of left IMA to LAD.  Grafting the underperfused first diagonal would also be indicated if the vessel is significant.  Patient will stop Brilinta 4 days prior to surgery will be  scheduled for CABG x2 on Monday, June 22 at [facility]. I discussed the details of CABG with patient including benefits and risks and expected recovery.  She understands that there will be limitation on visitors due to the COVID-19 hospital regulations.   Len Childs, MD [facility] [phone]

## 2019-03-29 NOTE — Progress Notes (Unsigned)
echo

## 2019-04-01 ENCOUNTER — Ambulatory Visit (HOSPITAL_COMMUNITY)
Admission: RE | Admit: 2019-04-01 | Discharge: 2019-04-01 | Disposition: A | Discharge status: Home / Self Care | Payer: 59 | Source: Ambulatory Visit | Attending: Cardiothoracic Surgery | Admitting: Cardiothoracic Surgery

## 2019-04-01 ENCOUNTER — Other Ambulatory Visit: Payer: Self-pay

## 2019-04-01 DIAGNOSIS — I083 Combined rheumatic disorders of mitral, aortic and tricuspid valves: Secondary | ICD-10-CM | POA: Insufficient documentation

## 2019-04-01 DIAGNOSIS — I252 Old myocardial infarction: Secondary | ICD-10-CM | POA: Diagnosis not present

## 2019-04-01 DIAGNOSIS — Z951 Presence of aortocoronary bypass graft: Secondary | ICD-10-CM | POA: Insufficient documentation

## 2019-04-01 DIAGNOSIS — I2510 Atherosclerotic heart disease of native coronary artery without angina pectoris: Secondary | ICD-10-CM | POA: Diagnosis not present

## 2019-04-01 NOTE — Progress Notes (Signed)
  Echocardiogram 2D Echocardiogram has been performed.  Oneal Deputy Shallon Yaklin 04/01/2019, 4:15 PM

## 2019-04-03 NOTE — Progress Notes (Signed)
[place], [place] - [address] [facility] [address] [place] [address] Phone: [phone] Fax: [phone]      Your procedure is scheduled on June 22nd.  Report to [facility] Main Entrance "A" at 5:30 A.M., and check in at the Admitting office.  Call this number if you have problems the morning of surgery:  [phone]  Call [phone] if you have any questions prior to your surgery date Monday-Friday 8am-4pm    Remember:  Do not eat or drink after midnight.   Take these medicines the morning of surgery with A SIP OF WATER   Tylenol - if needed  Atorvastatin (Lipitor)  Carvedilol (Coreg)  Nitroglycerin - if needed  Follow your surgeon's instructions on when to stop Aspirin & Brilinita.  If no instructions were given by your surgeon then you will need to call the office to get those instructions.    7 days prior to surgery STOP taking any Aleve, Naproxen, Ibuprofen, Motrin, Advil, Goody's, BC's, all herbal medications, fish oil, and all vitamins.    The Morning of Surgery  Do not wear jewelry, make-up or nail polish.  Do not wear lotions, powders, or perfumes, or deodorant  Do not shave 48 hours prior to surgery.    Do not bring valuables to the hospital.  [facility] is not responsible for any belongings or valuables.  If you are a smoker, DO NOT Smoke 24 hours prior to surgery IF you wear a CPAP at night please bring your mask, tubing, and machine the morning of surgery   Remember that you must have someone to transport you home after your surgery, and remain with you for 24 hours if you are discharged the same day.   Contacts, glasses, hearing aids, dentures or bridgework may not be worn into surgery.    Leave your suitcase in the car.  After surgery it may be brought to your room.  For patients admitted to the hospital, discharge time will be determined by your treatment team.  Patients discharged the day of  surgery will not be allowed to drive home.    Special instructions:   [place]- Preparing For Surgery  Before surgery, you can play an important role. Because skin is not sterile, your skin needs to be as free of germs as possible. You can reduce the number of germs on your skin by washing with CHG (chlorahexidine gluconate) Soap before surgery.  CHG is an antiseptic cleaner which kills germs and bonds with the skin to continue killing germs even after washing.    Oral Hygiene is also important to reduce your risk of infection.  Remember - BRUSH YOUR TEETH THE MORNING OF SURGERY WITH YOUR REGULAR TOOTHPASTE  Please do not use if you have an allergy to CHG or antibacterial soaps. If your skin becomes reddened/irritated stop using the CHG.  Do not shave (including legs and underarms) for at least 48 hours prior to first CHG shower. It is OK to shave your face.  Please follow these instructions carefully.   1. Shower the NIGHT BEFORE SURGERY and the MORNING OF SURGERY with CHG Soap.   2. If you chose to wash your hair, wash your hair first as usual with your normal shampoo.  3. After you shampoo, rinse your hair and body thoroughly to remove the shampoo.  4. Use CHG as you would any other liquid soap. You can apply CHG directly to the skin and wash gently  with a scrungie or a clean washcloth.   5. Apply the CHG Soap to your body ONLY FROM THE NECK DOWN.  Do not use on open wounds or open sores. Avoid contact with your eyes, ears, mouth and genitals (private parts). Wash Face and genitals (private parts)  with your normal soap.   6. Wash thoroughly, paying special attention to the area where your surgery will be performed.  7. Thoroughly rinse your body with warm water from the neck down.  8. DO NOT shower/wash with your normal soap after using and rinsing off the CHG Soap.  9. Pat yourself dry with a CLEAN TOWEL.  10. Wear CLEAN PAJAMAS to bed the night before surgery, wear  comfortable clothes the morning of surgery  11. Place CLEAN SHEETS on your bed the night of your first shower and DO NOT SLEEP WITH PETS.    Day of Surgery:  Do not apply any deodorants/lotions.  Please wear clean clothes to the hospital/surgery center.   Remember to brush your teeth WITH YOUR REGULAR TOOTHPASTE.   Please read over the following fact sheets that you were given.

## 2019-04-04 ENCOUNTER — Ambulatory Visit (HOSPITAL_COMMUNITY)
Admission: RE | Admit: 2019-04-04 | Discharge: 2019-04-04 | Disposition: A | Discharge status: Home / Self Care | Payer: 59 | Source: Ambulatory Visit | Attending: Cardiothoracic Surgery | Admitting: Cardiothoracic Surgery

## 2019-04-04 ENCOUNTER — Other Ambulatory Visit: Payer: Self-pay

## 2019-04-04 ENCOUNTER — Other Ambulatory Visit (HOSPITAL_COMMUNITY)
Admission: RE | Admit: 2019-04-04 | Discharge: 2019-04-04 | Disposition: A | Discharge status: Home / Self Care | Payer: 59 | Source: Ambulatory Visit | Attending: Cardiothoracic Surgery | Admitting: Cardiothoracic Surgery

## 2019-04-04 ENCOUNTER — Encounter (HOSPITAL_COMMUNITY): Payer: 59

## 2019-04-04 ENCOUNTER — Encounter (HOSPITAL_COMMUNITY)
Admission: RE | Admit: 2019-04-04 | Discharge: 2019-04-04 | Disposition: A | Discharge status: Home / Self Care | Payer: 59 | Source: Ambulatory Visit | Attending: Cardiothoracic Surgery | Admitting: Cardiothoracic Surgery

## 2019-04-04 ENCOUNTER — Encounter (HOSPITAL_COMMUNITY): Payer: Self-pay

## 2019-04-04 DIAGNOSIS — I2510 Atherosclerotic heart disease of native coronary artery without angina pectoris: Secondary | ICD-10-CM | POA: Diagnosis not present

## 2019-04-04 DIAGNOSIS — Z01818 Encounter for other preprocedural examination: Secondary | ICD-10-CM | POA: Diagnosis not present

## 2019-04-04 DIAGNOSIS — Z1159 Encounter for screening for other viral diseases: Secondary | ICD-10-CM | POA: Insufficient documentation

## 2019-04-04 HISTORY — DX: Other specified postprocedural states: Z98.890

## 2019-04-04 HISTORY — DX: Nausea with vomiting, unspecified: R11.2

## 2019-04-04 LAB — PULMONARY FUNCTION TEST
DL/VA % pred: 89 %
DL/VA: 4.02 ml/min/mmHg/L
DLCO cor % pred: 86 %
DLCO cor: 15.33 ml/min/mmHg
DLCO unc % pred: 84 %
DLCO unc: 14.84 ml/min/mmHg
FEF 25-75 Post: 3.35 L/sec
FEF 25-75 Pre: 3.48 L/sec
FEF2575-%Change-Post: -3 %
FEF2575-%Pred-Post: 134 %
FEF2575-%Pred-Pre: 140 %
FEV1-%Change-Post: -1 %
FEV1-%Pred-Post: 101 %
FEV1-%Pred-Pre: 103 %
FEV1-Post: 2.39 L
FEV1-Pre: 2.42 L
FEV1FVC-%Change-Post: 1 %
FEV1FVC-%Pred-Pre: 110 %
FEV6-%Change-Post: -2 %
FEV6-%Pred-Post: 92 %
FEV6-%Pred-Pre: 95 %
FEV6-Post: 2.67 L
FEV6-Pre: 2.75 L
FEV6FVC-%Pred-Post: 102 %
FEV6FVC-%Pred-Pre: 102 %
FVC-%Change-Post: -2 %
FVC-%Pred-Post: 90 %
FVC-%Pred-Pre: 93 %
FVC-Post: 2.67 L
FVC-Pre: 2.75 L
Post FEV1/FVC ratio: 90 %
Post FEV6/FVC ratio: 100 %
Pre FEV1/FVC ratio: 88 %
Pre FEV6/FVC Ratio: 100 %
RV % pred: 100 %
RV: 1.55 L
TLC % pred: 102 %
TLC: 4.42 L

## 2019-04-04 LAB — COMPREHENSIVE METABOLIC PANEL
ALT: 17 U/L (ref 0–44)
AST: 16 U/L (ref 15–41)
Albumin: 3.5 g/dL (ref 3.5–5.0)
Alkaline Phosphatase: 100 U/L (ref 38–126)
Anion gap: 9 (ref 5–15)
BUN: 13 mg/dL (ref 6–20)
CO2: 22 mmol/L (ref 22–32)
Calcium: 8.6 mg/dL — ABNORMAL LOW (ref 8.9–10.3)
Chloride: 107 mmol/L (ref 98–111)
Creatinine, Ser: 0.77 mg/dL (ref 0.44–1.00)
GFR calc Af Amer: >60 mL/min (ref >60)
GFR calc non Af Amer: >60 mL/min (ref >60)
Glucose, Bld: 93 mg/dL (ref 70–99)
Potassium: 4 mmol/L (ref 3.5–5.1)
Sodium: 138 mmol/L (ref 135–145)
Total Bilirubin: 0.8 mg/dL (ref 0.3–1.2)
Total Protein: 6.4 g/dL — ABNORMAL LOW (ref 6.5–8.1)

## 2019-04-04 LAB — BLOOD GAS, ARTERIAL
Acid-base deficit: 0.4 mmol/L (ref 0.0–2.0)
Bicarbonate: 23.1 mmol/L (ref 20.0–28.0)
Drawn by: 246101
FIO2: 21
O2 Saturation: 98.2 %
Patient temperature: 98.6
pCO2 arterial: 34.4 mmHg (ref 32.0–48.0)
pH, Arterial: 7.443 (ref 7.350–7.450)
pO2, Arterial: 108 mmHg (ref 83.0–108.0)

## 2019-04-04 LAB — URINALYSIS, ROUTINE W REFLEX MICROSCOPIC
Bilirubin Urine: NEGATIVE
Glucose, UA: NEGATIVE mg/dL
Ketones, ur: NEGATIVE mg/dL
Nitrite: NEGATIVE
Protein, ur: NEGATIVE mg/dL
Specific Gravity, Urine: 1.013 (ref 1.005–1.030)
pH: 6 (ref 5.0–8.0)

## 2019-04-04 LAB — CBC
HCT: 37 % (ref 36.0–46.0)
Hemoglobin: 12 g/dL (ref 12.0–15.0)
MCH: 30.8 pg (ref 26.0–34.0)
MCHC: 32.4 g/dL (ref 30.0–36.0)
MCV: 94.9 fL (ref 80.0–100.0)
Platelets: 336 10*3/uL (ref 150–400)
RBC: 3.9 MIL/uL (ref 3.87–5.11)
RDW: 13.2 % (ref 11.5–15.5)
WBC: 11 10*3/uL — ABNORMAL HIGH (ref 4.0–10.5)
nRBC: 0 % (ref 0.0–0.2)

## 2019-04-04 LAB — HEMOGLOBIN A1C
Hgb A1c MFr Bld: 5.4 % (ref 4.8–5.6)
Mean Plasma Glucose: 108.28 mg/dL

## 2019-04-04 LAB — SURGICAL PCR SCREEN
MRSA, PCR: NEGATIVE
Staphylococcus aureus: NEGATIVE

## 2019-04-04 LAB — PROTIME-INR
INR: 1.1 (ref 0.8–1.2)
Prothrombin Time: 13.6 seconds (ref 11.4–15.2)

## 2019-04-04 LAB — ABO/RH: ABO/RH(D): B POS

## 2019-04-04 LAB — APTT: aPTT: 32 seconds (ref 24–36)

## 2019-04-04 MED ORDER — ALBUTEROL SULFATE (2.5 MG/3ML) 0.083% IN NEBU
2.5000 mg | INHALATION_SOLUTION | Freq: Once | RESPIRATORY_TRACT | Status: AC
  Administered 2019-04-04: 2.5 mg via RESPIRATORY_TRACT

## 2019-04-04 NOTE — Progress Notes (Signed)
Pre CABG vascular exam completed. Preliminary results in Chart review CV Proc. [facility], [place] 04/04/2019, 1:03 PM

## 2019-04-04 NOTE — Progress Notes (Signed)
PCP - Dr. Edrick Oh Cardiologist - Dr. Tamala Julian  Chest x-ray - 04/04/2019 EKG - 03/20/2019 Stress Test -  ECHO - 04/01/2019 Cardiac Cath - 03/26/2019  Sleep Study - patient denies CPAP -   Fasting Blood Sugar - n/a Checks Blood Sugar _____ times a day  Blood Thinner Instructions: Last dose of Brilinta was 04/03/19 in the am Aspirin Instructions: patient told to continue ASA and stop the day before surgery  Anesthesia review: yes, history of CAD, recent MI and cardiac workup  Patient denies shortness of breath, fever, cough and chest pain at PAT appointment   Coronavirus Screening  Have you experienced the following symptoms:  Cough yes/no: No Fever (>100.60F)  yes/no: No Runny nose yes/no: No Sore throat yes/no: No Difficulty breathing/shortness of breath  yes/no: No  Have you or a family member traveled in the last 14 days and where? yes/no: No   If the patient indicates "YES" to the above questions, their PAT will be rescheduled to limit the exposure to others and, the surgeon will be notified. THE PATIENT WILL NEED TO BE ASYMPTOMATIC FOR 14 DAYS.   If the patient is not experiencing any of these symptoms, the PAT nurse will instruct them to NOT bring anyone with them to their appointment since they may have these symptoms or traveled as well.   Please remind your patients and families that hospital visitation restrictions are in effect and the importance of the restrictions.     Patient verbalized understanding of instructions that were given to them at the PAT appointment. Patient was also instructed that they will need to review over the PAT instructions again at home before surgery.

## 2019-04-04 NOTE — Progress Notes (Signed)
Left VM for [name] letting her know that patient's UA was abnormal.

## 2019-04-05 ENCOUNTER — Other Ambulatory Visit: Payer: Self-pay

## 2019-04-05 ENCOUNTER — Telehealth: Payer: Self-pay | Admitting: Family Medicine

## 2019-04-05 ENCOUNTER — Other Ambulatory Visit: Payer: Self-pay | Admitting: *Deleted

## 2019-04-05 DIAGNOSIS — N3000 Acute cystitis without hematuria: Secondary | ICD-10-CM

## 2019-04-05 LAB — SARS CORONAVIRUS 2 (TAT 6-24 HRS): SARS Coronavirus 2: NEGATIVE

## 2019-04-05 MED ORDER — EPINEPHRINE PF 1 MG/ML IJ SOLN
0.0000 ug/min | INTRAVENOUS | Status: DC
  Filled 2019-04-05: qty 4

## 2019-04-05 MED ORDER — SODIUM CHLORIDE 0.9 % IV SOLN
750.0000 mg | INTRAVENOUS | Status: AC
  Filled 2019-04-05: qty 750

## 2019-04-05 MED ORDER — DOPAMINE-DEXTROSE 3.2-5 MG/ML-% IV SOLN
0.0000 ug/kg/min | INTRAVENOUS | Status: DC
  Filled 2019-04-05: qty 250

## 2019-04-05 MED ORDER — SODIUM CHLORIDE 0.9 % IV SOLN
INTRAVENOUS | Status: DC
  Filled 2019-04-05: qty 30

## 2019-04-05 MED ORDER — PHENYLEPHRINE HCL-NACL 20-0.9 MG/250ML-% IV SOLN
30.0000 ug/min | INTRAVENOUS | Status: AC
  Administered 2019-04-08: 30 ug/min via INTRAVENOUS
  Filled 2019-04-05: qty 250

## 2019-04-05 MED ORDER — NOREPINEPHRINE 4 MG/250ML-% IV SOLN
0.0000 ug/min | INTRAVENOUS | Status: DC
  Filled 2019-04-05: qty 250

## 2019-04-05 MED ORDER — MILRINONE LACTATE IN DEXTROSE 20-5 MG/100ML-% IV SOLN
0.3000 ug/kg/min | INTRAVENOUS | Status: DC
  Filled 2019-04-05: qty 100

## 2019-04-05 MED ORDER — INSULIN REGULAR(HUMAN) IN NACL 100-0.9 UT/100ML-% IV SOLN
INTRAVENOUS | Status: AC
  Administered 2019-04-08: 1 [IU]/h via INTRAVENOUS
  Filled 2019-04-05: qty 100

## 2019-04-05 MED ORDER — PLASMA-LYTE 148 IV SOLN
INTRAVENOUS | Status: AC
  Filled 2019-04-05: qty 2.5

## 2019-04-05 MED ORDER — POTASSIUM CHLORIDE 2 MEQ/ML IV SOLN
80.0000 meq | INTRAVENOUS | Status: DC
  Filled 2019-04-05: qty 40

## 2019-04-05 MED ORDER — TRANEXAMIC ACID (OHS) BOLUS VIA INFUSION
15.0000 mg/kg | INTRAVENOUS | Status: AC
  Administered 2019-04-08: 1234.5 mg via INTRAVENOUS
  Filled 2019-04-05: qty 1235

## 2019-04-05 MED ORDER — TRANEXAMIC ACID (OHS) PUMP PRIME SOLUTION
2.0000 mg/kg | INTRAVENOUS | Status: DC
  Filled 2019-04-05: qty 1.65

## 2019-04-05 MED ORDER — TRANEXAMIC ACID 1000 MG/10ML IV SOLN
1.5000 mg/kg/h | INTRAVENOUS | Status: AC
  Administered 2019-04-08: 1.5 mg/kg/h via INTRAVENOUS
  Filled 2019-04-05: qty 25

## 2019-04-05 MED ORDER — DEXMEDETOMIDINE HCL IN NACL 400 MCG/100ML IV SOLN
0.1000 ug/kg/h | INTRAVENOUS | Status: AC
  Administered 2019-04-08: 0.7 ug/kg/h via INTRAVENOUS
  Filled 2019-04-05: qty 100

## 2019-04-05 MED ORDER — NITROGLYCERIN IN D5W 200-5 MCG/ML-% IV SOLN
2.0000 ug/min | INTRAVENOUS | Status: DC
  Filled 2019-04-05: qty 250

## 2019-04-05 MED ORDER — SODIUM CHLORIDE 0.9 % IV SOLN
1.5000 g | INTRAVENOUS | Status: AC
  Administered 2019-04-08: 1.5 g via INTRAVENOUS
  Filled 2019-04-05 (×2): qty 1.5

## 2019-04-05 MED ORDER — MAGNESIUM SULFATE 50 % IJ SOLN
40.0000 meq | INTRAMUSCULAR | Status: DC
  Filled 2019-04-05: qty 9.85

## 2019-04-05 MED ORDER — CIPROFLOXACIN HCL 500 MG PO TABS: 500.0000 mg | ORAL_TABLET | Freq: Two times a day (BID) | ORAL | 0 refills | Status: AC

## 2019-04-05 MED ORDER — VANCOMYCIN HCL 10 G IV SOLR
1250.0000 mg | INTRAVENOUS | Status: AC
  Administered 2019-04-08: 1250 mg via INTRAVENOUS
  Filled 2019-04-05: qty 1250

## 2019-04-05 MED FILL — CIPROFLOXACIN HCL 500 MG TA: 500 | 3 days supply | Qty: 6 | Fill #0

## 2019-04-05 NOTE — Progress Notes (Signed)
Pt notified of RX 

## 2019-04-05 NOTE — Telephone Encounter (Signed)
Patient is calling to see if Dr. Birdie Riddle will accept her has a new patient. Patient was referred by Reesa Chew, PA at [facility] Patient will be have Bypass Surgery on Monday. Dr. Berlin Hun will be the surgeon. The patient cardiologist is Dr. Conchita Paris. Her previous PCP Dr. Edrick Oh is retiring. Please advise. Patient will be in the hospital for approx 5 days. [phone]

## 2019-04-05 NOTE — Patient Outreach (Addendum)
[facility]) Care Management  04/05/2019  SHAWNEEN DEETZ [birth year] [id number]   Preoperative Screening Call/Transition of Care Referral received:  Surgery/procedure date:  Insurance: [place] Choice Plan  Subjective:  Initial successful telephone call to Elleanor's preferred number in order to complete preoperative screening. 2 HIPAA identifiers verified. This RNCM is familiar with Adelfa for the transition of care call completed in March after she had a myocardial infarction. Discussed purpose of preoperative call. Patient voices understanding and agrees to call. She states she understands the reason for the surgery and the expected time of arrival. She admits she is very nervous about the upcoming surgery. She says she completed her pre-op testing on 04/04/19 and has no additional questions. She says she expects to be in the hospital about 5 days. She states she has completed medical leave and disability paperwork. She says she was told she will be out of work for 12 weeks. She says she does not think she has the hospital indemnity benefit but she will check her benefits. She says she will have 24/7 care at home to assist in her recovery provided by her husband and daughter.  She agrees to a post hospital discharge transition of care call.    Objective:  Nolon Rod, the assistant director for acute rehabilitation at [facility],  is scheduled for coronary artery bypass grafting and transesophageal echo on 04/08/19 at [facility].   Assessment: Preoperative call completed, no preoperative needs identified. Khaleesi is nervous about the surgery.  Plan: Acknowledged Jamillia's feelings and emotional support provided. Encouraged Idabell to enjoy the weekend and do something she enjoys.  RNCM will call [facility] for post hospital transition of care outreach within 72 hours of hospital discharge notification.   Barrington Ellison RN,CCM,CDE [facility] Management Coordinator Office Phone [phone] Office Fax [phone]

## 2019-04-07 NOTE — Anesthesia Preprocedure Evaluation (Addendum)
Anesthesia Evaluation  Patient identified by MRN, date of birth, ID band Patient awake    Reviewed: Allergy & Precautions, NPO status , Patient's Chart, lab work & pertinent test results, reviewed documented beta blocker date and time   History of Anesthesia Complications (+) PONV  Airway Mallampati: III  TM Distance: >3 FB Neck ROM: Full    Dental  (+) Dental Advisory Given   Pulmonary  04/04/2019 SARS coronavirus NEG   breath sounds clear to auscultation       Cardiovascular hypertension, Pt. on medications and Pt. on home beta blockers + angina + CAD, + Past MI and + Cardiac Stents   Rhythm:Regular Rate:Normal  12/2018 ECHO: EF 55-60%, mild mitral calcification   Neuro/Psych negative neurological ROS     GI/Hepatic negative GI ROS, Neg liver ROS,   Endo/Other  Morbid obesity  Renal/GU negative Renal ROS     Musculoskeletal   Abdominal   Peds  Hematology brilinta   Anesthesia Other Findings   Reproductive/Obstetrics S/p BTL                            Anesthesia Physical Anesthesia Plan  ASA: III  Anesthesia Plan: General   Post-op Pain Management:    Induction: Intravenous  PONV Risk Score and Plan: 3 and Treatment may vary due to age or medical condition  Airway Management Planned: Oral ETT and Video Laryngoscope Planned  Additional Equipment: Arterial line, PA Cath, TEE and Ultrasound Guidance Line Placement  Intra-op Plan:   Post-operative Plan: Post-operative intubation/ventilation  Informed Consent: I have reviewed the patients History and Physical, chart, labs and discussed the procedure including the risks, benefits and alternatives for the proposed anesthesia with the patient or authorized representative who has indicated his/her understanding and acceptance.     Dental advisory given  Plan Discussed with: CRNA and Surgeon  Anesthesia Plan Comments:         Anesthesia Quick Evaluation

## 2019-04-08 ENCOUNTER — Inpatient Hospital Stay (HOSPITAL_COMMUNITY): Payer: 59 | Admitting: Anesthesiology

## 2019-04-08 ENCOUNTER — Other Ambulatory Visit: Payer: Self-pay

## 2019-04-08 ENCOUNTER — Inpatient Hospital Stay (HOSPITAL_COMMUNITY): Payer: 59

## 2019-04-08 ENCOUNTER — Inpatient Hospital Stay (HOSPITAL_COMMUNITY)
Admission: RE | Disposition: A | Discharge status: Home / Self Care | Payer: Self-pay | Source: Ambulatory Visit | Attending: Cardiothoracic Surgery

## 2019-04-08 ENCOUNTER — Encounter (HOSPITAL_COMMUNITY): Payer: Self-pay | Admitting: Surgery

## 2019-04-08 ENCOUNTER — Inpatient Hospital Stay (HOSPITAL_COMMUNITY)
Admission: RE | Admit: 2019-04-08 | Discharge: 2019-04-13 | DRG: 236 | Disposition: A | Discharge status: Home / Self Care | Payer: 59 | Source: Ambulatory Visit | Attending: Cardiothoracic Surgery | Admitting: Cardiothoracic Surgery

## 2019-04-08 DIAGNOSIS — I252 Old myocardial infarction: Secondary | ICD-10-CM | POA: Diagnosis not present

## 2019-04-08 DIAGNOSIS — Z8249 Family history of ischemic heart disease and other diseases of the circulatory system: Secondary | ICD-10-CM | POA: Diagnosis not present

## 2019-04-08 DIAGNOSIS — Z6838 Body mass index (BMI) 38.0-38.9, adult: Secondary | ICD-10-CM | POA: Diagnosis not present

## 2019-04-08 DIAGNOSIS — J939 Pneumothorax, unspecified: Secondary | ICD-10-CM

## 2019-04-08 DIAGNOSIS — Z951 Presence of aortocoronary bypass graft: Secondary | ICD-10-CM | POA: Diagnosis not present

## 2019-04-08 DIAGNOSIS — I10 Essential (primary) hypertension: Secondary | ICD-10-CM | POA: Diagnosis present

## 2019-04-08 DIAGNOSIS — E785 Hyperlipidemia, unspecified: Secondary | ICD-10-CM | POA: Diagnosis not present

## 2019-04-08 DIAGNOSIS — I340 Nonrheumatic mitral (valve) insufficiency: Secondary | ICD-10-CM | POA: Diagnosis not present

## 2019-04-08 DIAGNOSIS — Z79899 Other long term (current) drug therapy: Secondary | ICD-10-CM | POA: Diagnosis not present

## 2019-04-08 DIAGNOSIS — Z955 Presence of coronary angioplasty implant and graft: Secondary | ICD-10-CM

## 2019-04-08 DIAGNOSIS — Z950 Presence of cardiac pacemaker: Secondary | ICD-10-CM | POA: Diagnosis not present

## 2019-04-08 DIAGNOSIS — J9383 Other pneumothorax: Secondary | ICD-10-CM | POA: Diagnosis present

## 2019-04-08 DIAGNOSIS — I2510 Atherosclerotic heart disease of native coronary artery without angina pectoris: Principal | ICD-10-CM | POA: Diagnosis present

## 2019-04-08 DIAGNOSIS — I959 Hypotension, unspecified: Secondary | ICD-10-CM | POA: Diagnosis not present

## 2019-04-08 DIAGNOSIS — R079 Chest pain, unspecified: Secondary | ICD-10-CM | POA: Diagnosis not present

## 2019-04-08 DIAGNOSIS — D62 Acute posthemorrhagic anemia: Secondary | ICD-10-CM | POA: Diagnosis not present

## 2019-04-08 DIAGNOSIS — J90 Pleural effusion, not elsewhere classified: Secondary | ICD-10-CM | POA: Diagnosis not present

## 2019-04-08 DIAGNOSIS — I255 Ischemic cardiomyopathy: Secondary | ICD-10-CM | POA: Diagnosis present

## 2019-04-08 DIAGNOSIS — R918 Other nonspecific abnormal finding of lung field: Secondary | ICD-10-CM | POA: Diagnosis not present

## 2019-04-08 HISTORY — PX: TEE WITHOUT CARDIOVERSION: SHX5443

## 2019-04-08 HISTORY — PX: CORONARY ARTERY BYPASS GRAFT: SHX141

## 2019-04-08 LAB — POCT I-STAT 4, (NA,K, GLUC, HGB,HCT)
Glucose, Bld: 109 mg/dL — ABNORMAL HIGH (ref 70–99)
Glucose, Bld: 101 mg/dL — ABNORMAL HIGH (ref 70–99)
Glucose, Bld: 80 mg/dL (ref 70–99)
Glucose, Bld: 121 mg/dL — ABNORMAL HIGH (ref 70–99)
Glucose, Bld: 125 mg/dL — ABNORMAL HIGH (ref 70–99)
Glucose, Bld: 132 mg/dL — ABNORMAL HIGH (ref 70–99)
Glucose, Bld: 124 mg/dL — ABNORMAL HIGH (ref 70–99)
HCT: 31 % — ABNORMAL LOW (ref 36.0–46.0)
HCT: 20 % — ABNORMAL LOW (ref 36.0–46.0)
HCT: 32 % — ABNORMAL LOW (ref 36.0–46.0)
HCT: 24 % — ABNORMAL LOW (ref 36.0–46.0)
HCT: 23 % — ABNORMAL LOW (ref 36.0–46.0)
HCT: 31 % — ABNORMAL LOW (ref 36.0–46.0)
HCT: 26 % — ABNORMAL LOW (ref 36.0–46.0)
Hemoglobin: 10.5 g/dL — ABNORMAL LOW (ref 12.0–15.0)
Hemoglobin: 10.9 g/dL — ABNORMAL LOW (ref 12.0–15.0)
Hemoglobin: 6.8 g/dL — CL (ref 12.0–15.0)
Hemoglobin: 8.2 g/dL — ABNORMAL LOW (ref 12.0–15.0)
Hemoglobin: 7.8 g/dL — ABNORMAL LOW (ref 12.0–15.0)
Hemoglobin: 10.5 g/dL — ABNORMAL LOW (ref 12.0–15.0)
Hemoglobin: 8.8 g/dL — ABNORMAL LOW (ref 12.0–15.0)
Potassium: 3.7 mmol/L (ref 3.5–5.1)
Potassium: 3.4 mmol/L — ABNORMAL LOW (ref 3.5–5.1)
Potassium: 4.5 mmol/L (ref 3.5–5.1)
Potassium: 4.7 mmol/L (ref 3.5–5.1)
Potassium: 4.2 mmol/L (ref 3.5–5.1)
Potassium: 3.8 mmol/L (ref 3.5–5.1)
Potassium: 4.4 mmol/L (ref 3.5–5.1)
Sodium: 139 mmol/L (ref 135–145)
Sodium: 142 mmol/L (ref 135–145)
Sodium: 145 mmol/L (ref 135–145)
Sodium: 139 mmol/L (ref 135–145)
Sodium: 140 mmol/L (ref 135–145)
Sodium: 141 mmol/L (ref 135–145)
Sodium: 142 mmol/L (ref 135–145)

## 2019-04-08 LAB — POCT I-STAT 7, (LYTES, BLD GAS, ICA,H+H)
Acid-base deficit: 2 mmol/L (ref 0.0–2.0)
Acid-base deficit: 4 mmol/L — ABNORMAL HIGH (ref 0.0–2.0)
Acid-base deficit: 5 mmol/L — ABNORMAL HIGH (ref 0.0–2.0)
Acid-base deficit: 4 mmol/L — ABNORMAL HIGH (ref 0.0–2.0)
Bicarbonate: 23.1 mmol/L (ref 20.0–28.0)
Bicarbonate: 22.6 mmol/L (ref 20.0–28.0)
Bicarbonate: 20.1 mmol/L (ref 20.0–28.0)
Bicarbonate: 19.9 mmol/L — ABNORMAL LOW (ref 20.0–28.0)
Bicarbonate: 21.4 mmol/L (ref 20.0–28.0)
Calcium, Ion: 0.82 mmol/L — CL (ref 1.15–1.40)
Calcium, Ion: 1.01 mmol/L — ABNORMAL LOW (ref 1.15–1.40)
Calcium, Ion: 1.05 mmol/L — ABNORMAL LOW (ref 1.15–1.40)
Calcium, Ion: 1.06 mmol/L — ABNORMAL LOW (ref 1.15–1.40)
Calcium, Ion: 1.06 mmol/L — ABNORMAL LOW (ref 1.15–1.40)
HCT: 20 % — ABNORMAL LOW (ref 36.0–46.0)
HCT: 22 % — ABNORMAL LOW (ref 36.0–46.0)
HCT: 29 % — ABNORMAL LOW (ref 36.0–46.0)
HCT: 25 % — ABNORMAL LOW (ref 36.0–46.0)
HCT: 26 % — ABNORMAL LOW (ref 36.0–46.0)
Hemoglobin: 6.8 g/dL — CL (ref 12.0–15.0)
Hemoglobin: 7.5 g/dL — ABNORMAL LOW (ref 12.0–15.0)
Hemoglobin: 9.9 g/dL — ABNORMAL LOW (ref 12.0–15.0)
Hemoglobin: 8.5 g/dL — ABNORMAL LOW (ref 12.0–15.0)
Hemoglobin: 8.8 g/dL — ABNORMAL LOW (ref 12.0–15.0)
O2 Saturation: 100 %
O2 Saturation: 99 %
O2 Saturation: 99 %
O2 Saturation: 98 %
O2 Saturation: 100 %
Patient temperature: 35.1
Patient temperature: 36.9
Patient temperature: 37.2
Potassium: 4.6 mmol/L (ref 3.5–5.1)
Potassium: 4.3 mmol/L (ref 3.5–5.1)
Potassium: 3.8 mmol/L (ref 3.5–5.1)
Potassium: 4.1 mmol/L (ref 3.5–5.1)
Potassium: 4.4 mmol/L (ref 3.5–5.1)
Sodium: 142 mmol/L (ref 135–145)
Sodium: 140 mmol/L (ref 135–145)
Sodium: 142 mmol/L (ref 135–145)
Sodium: 143 mmol/L (ref 135–145)
Sodium: 142 mmol/L (ref 135–145)
TCO2: 24 mmol/L (ref 22–32)
TCO2: 24 mmol/L (ref 22–32)
TCO2: 21 mmol/L — ABNORMAL LOW (ref 22–32)
TCO2: 21 mmol/L — ABNORMAL LOW (ref 22–32)
TCO2: 23 mmol/L (ref 22–32)
pCO2 arterial: 31.4 mmHg — ABNORMAL LOW (ref 32.0–48.0)
pCO2 arterial: 38.4 mmHg (ref 32.0–48.0)
pCO2 arterial: 28.6 mmHg — ABNORMAL LOW (ref 32.0–48.0)
pCO2 arterial: 36.6 mmHg (ref 32.0–48.0)
pCO2 arterial: 40.2 mmHg (ref 32.0–48.0)
pH, Arterial: 7.475 — ABNORMAL HIGH (ref 7.350–7.450)
pH, Arterial: 7.377 (ref 7.350–7.450)
pH, Arterial: 7.446 (ref 7.350–7.450)
pH, Arterial: 7.344 — ABNORMAL LOW (ref 7.350–7.450)
pH, Arterial: 7.335 — ABNORMAL LOW (ref 7.350–7.450)
pO2, Arterial: 396 mmHg — ABNORMAL HIGH (ref 83.0–108.0)
pO2, Arterial: 122 mmHg — ABNORMAL HIGH (ref 83.0–108.0)
pO2, Arterial: 121 mmHg — ABNORMAL HIGH (ref 83.0–108.0)
pO2, Arterial: 111 mmHg — ABNORMAL HIGH (ref 83.0–108.0)
pO2, Arterial: 183 mmHg — ABNORMAL HIGH (ref 83.0–108.0)

## 2019-04-08 LAB — CREATININE, SERUM
Creatinine, Ser: 0.68 mg/dL (ref 0.44–1.00)
GFR calc Af Amer: >60 mL/min (ref >60)
GFR calc non Af Amer: >60 mL/min (ref >60)

## 2019-04-08 LAB — PLATELET COUNT: Platelets: 199 10*3/uL (ref 150–400)

## 2019-04-08 LAB — GLUCOSE, CAPILLARY
Glucose-Capillary: 146 mg/dL — ABNORMAL HIGH (ref 70–99)
Glucose-Capillary: 120 mg/dL — ABNORMAL HIGH (ref 70–99)
Glucose-Capillary: 118 mg/dL — ABNORMAL HIGH (ref 70–99)

## 2019-04-08 LAB — CBC
HCT: 32 % — ABNORMAL LOW (ref 36.0–46.0)
HCT: 28.1 % — ABNORMAL LOW (ref 36.0–46.0)
Hemoglobin: 10.8 g/dL — ABNORMAL LOW (ref 12.0–15.0)
Hemoglobin: 9.2 g/dL — ABNORMAL LOW (ref 12.0–15.0)
MCH: 30.9 pg (ref 26.0–34.0)
MCH: 30.4 pg (ref 26.0–34.0)
MCHC: 33.8 g/dL (ref 30.0–36.0)
MCHC: 32.7 g/dL (ref 30.0–36.0)
MCV: 91.4 fL (ref 80.0–100.0)
MCV: 92.7 fL (ref 80.0–100.0)
Platelets: 194 10*3/uL (ref 150–400)
Platelets: 213 10*3/uL (ref 150–400)
RBC: 3.5 MIL/uL — ABNORMAL LOW (ref 3.87–5.11)
RBC: 3.03 MIL/uL — ABNORMAL LOW (ref 3.87–5.11)
RDW: 13.7 % (ref 11.5–15.5)
RDW: 14.5 % (ref 11.5–15.5)
WBC: 23.4 10*3/uL — ABNORMAL HIGH (ref 4.0–10.5)
WBC: 21.3 10*3/uL — ABNORMAL HIGH (ref 4.0–10.5)
nRBC: 0 % (ref 0.0–0.2)
nRBC: 0 % (ref 0.0–0.2)

## 2019-04-08 LAB — APTT: aPTT: 32 seconds (ref 24–36)

## 2019-04-08 LAB — PROTIME-INR
INR: 1.6 — ABNORMAL HIGH (ref 0.8–1.2)
Prothrombin Time: 18.5 seconds — ABNORMAL HIGH (ref 11.4–15.2)

## 2019-04-08 LAB — MAGNESIUM: Magnesium: 3.6 mg/dL — ABNORMAL HIGH (ref 1.7–2.4)

## 2019-04-08 LAB — HEMOGLOBIN AND HEMATOCRIT, BLOOD
HCT: 25.8 % — ABNORMAL LOW (ref 36.0–46.0)
Hemoglobin: 8.4 g/dL — ABNORMAL LOW (ref 12.0–15.0)

## 2019-04-08 LAB — PREPARE RBC (CROSSMATCH)

## 2019-04-08 LAB — ECHO INTRAOPERATIVE TEE

## 2019-04-08 LAB — POCT PREGNANCY, URINE: Preg Test, Ur: NEGATIVE

## 2019-04-08 SURGERY — CORONARY ARTERY BYPASS GRAFTING (CABG)
Anesthesia: General | Site: Chest

## 2019-04-08 MED ORDER — ALBUMIN HUMAN 5 % IV SOLN
250.0000 mL | INTRAVENOUS | Status: AC | PRN
  Administered 2019-04-08 (×4): 12.5 g via INTRAVENOUS
  Filled 2019-04-08 (×2): qty 250

## 2019-04-08 MED ORDER — SODIUM CHLORIDE 0.9 % IV SOLN
750.0000 mg | INTRAVENOUS | Status: DC
  Filled 2019-04-08: qty 750

## 2019-04-08 MED ORDER — ROCURONIUM BROMIDE 10 MG/ML (PF) SYRINGE
PREFILLED_SYRINGE | INTRAVENOUS | Status: AC
  Filled 2019-04-08: qty 20

## 2019-04-08 MED ORDER — MIDAZOLAM HCL 5 MG/5ML IJ SOLN
INTRAMUSCULAR | Status: DC | PRN
  Administered 2019-04-08 (×4): 2 mg via INTRAVENOUS
  Administered 2019-04-08 (×2): 1 mg via INTRAVENOUS
  Administered 2019-04-08: 2 mg via INTRAVENOUS

## 2019-04-08 MED ORDER — DOCUSATE SODIUM 100 MG PO CAPS
200.0000 mg | ORAL_CAPSULE | Freq: Every day | ORAL | Status: DC
  Administered 2019-04-09 – 2019-04-12 (×3): 200 mg via ORAL
  Filled 2019-04-08 (×4): qty 2

## 2019-04-08 MED ORDER — PLASMA-LYTE 148 IV SOLN
INTRAVENOUS | Status: DC | PRN
  Administered 2019-04-08: 500 mL via INTRAVASCULAR

## 2019-04-08 MED ORDER — INSULIN REGULAR BOLUS VIA INFUSION
0.0000 [IU] | Freq: Three times a day (TID) | INTRAVENOUS | Status: DC
  Filled 2019-04-08: qty 10

## 2019-04-08 MED ORDER — SODIUM CHLORIDE 0.9% FLUSH
3.0000 mL | Freq: Two times a day (BID) | INTRAVENOUS | Status: DC
  Administered 2019-04-09 – 2019-04-12 (×4): 3 mL via INTRAVENOUS

## 2019-04-08 MED ORDER — ALBUMIN HUMAN 5 % IV SOLN
INTRAVENOUS | Status: DC | PRN
  Administered 2019-04-08: 12:00:00 via INTRAVENOUS

## 2019-04-08 MED ORDER — ROCURONIUM BROMIDE 10 MG/ML (PF) SYRINGE
PREFILLED_SYRINGE | INTRAVENOUS | Status: DC | PRN
  Administered 2019-04-08: 100 mg via INTRAVENOUS
  Administered 2019-04-08: 70 mg via INTRAVENOUS
  Administered 2019-04-08: 30 mg via INTRAVENOUS

## 2019-04-08 MED ORDER — SODIUM CHLORIDE 0.45 % IV SOLN
INTRAVENOUS | Status: DC | PRN
  Administered 2019-04-09: 23:00:00 via INTRAVENOUS

## 2019-04-08 MED ORDER — VASOPRESSIN 20 UNIT/ML IV SOLN
INTRAVENOUS | Status: AC
  Filled 2019-04-08: qty 1

## 2019-04-08 MED ORDER — ACETAMINOPHEN 160 MG/5ML PO SOLN: 1000.0000 mg | Freq: Four times a day (QID) | ORAL | Status: DC

## 2019-04-08 MED ORDER — NITROGLYCERIN IN D5W 200-5 MCG/ML-% IV SOLN: 0.0000 ug/min | INTRAVENOUS | Status: DC

## 2019-04-08 MED ORDER — HYDRALAZINE HCL 20 MG/ML IJ SOLN: 10.0000 mg | INTRAMUSCULAR | Status: DC | PRN

## 2019-04-08 MED ORDER — BISACODYL 5 MG PO TBEC
10.0000 mg | DELAYED_RELEASE_TABLET | Freq: Every day | ORAL | Status: DC
  Administered 2019-04-09 – 2019-04-12 (×3): 10 mg via ORAL
  Filled 2019-04-08 (×4): qty 2

## 2019-04-08 MED ORDER — SODIUM CHLORIDE (PF) 0.9 % IJ SOLN
OROMUCOSAL | Status: DC | PRN
  Administered 2019-04-08 (×3): 4 mL via TOPICAL

## 2019-04-08 MED ORDER — MIDAZOLAM HCL 2 MG/2ML IJ SOLN: 2.0000 mg | INTRAMUSCULAR | Status: DC | PRN

## 2019-04-08 MED ORDER — PROPOFOL 10 MG/ML IV BOLUS
INTRAVENOUS | Status: DC | PRN
  Administered 2019-04-08: 20 mg via INTRAVENOUS

## 2019-04-08 MED ORDER — TRAMADOL HCL 50 MG PO TABS
50.0000 mg | ORAL_TABLET | ORAL | Status: DC | PRN
  Administered 2019-04-09: 50 mg via ORAL
  Administered 2019-04-10: 100 mg via ORAL
  Administered 2019-04-10: 50 mg via ORAL
  Administered 2019-04-11: 100 mg via ORAL
  Filled 2019-04-08: qty 2
  Filled 2019-04-08: qty 1
  Filled 2019-04-08 (×2): qty 2
  Filled 2019-04-08: qty 1

## 2019-04-08 MED ORDER — SODIUM CHLORIDE 0.9 % IV SOLN
INTRAVENOUS | Status: DC | PRN
  Administered 2019-04-08: 12:00:00 via INTRAVENOUS

## 2019-04-08 MED ORDER — METOPROLOL TARTRATE 12.5 MG HALF TABLET
12.5000 mg | ORAL_TABLET | Freq: Once | ORAL | Status: AC
  Administered 2019-04-08: 12.5 mg via ORAL
  Filled 2019-04-08: qty 1

## 2019-04-08 MED ORDER — OXYCODONE HCL 5 MG PO TABS
5.0000 mg | ORAL_TABLET | ORAL | Status: DC | PRN
  Administered 2019-04-09 – 2019-04-12 (×7): 5 mg via ORAL
  Filled 2019-04-08 (×7): qty 1

## 2019-04-08 MED ORDER — FENTANYL CITRATE (PF) 250 MCG/5ML IJ SOLN
INTRAMUSCULAR | Status: DC | PRN
  Administered 2019-04-08: 600 ug via INTRAVENOUS
  Administered 2019-04-08 (×3): 100 ug via INTRAVENOUS
  Administered 2019-04-08 (×3): 50 ug via INTRAVENOUS
  Administered 2019-04-08 (×2): 100 ug via INTRAVENOUS

## 2019-04-08 MED ORDER — POTASSIUM CHLORIDE 10 MEQ/50ML IV SOLN
10.0000 meq | INTRAVENOUS | Status: AC
  Administered 2019-04-08 (×3): 10 meq via INTRAVENOUS

## 2019-04-08 MED ORDER — SODIUM CHLORIDE 0.9 % IV SOLN
INTRAVENOUS | Status: DC | PRN
  Administered 2019-04-08: 25 ug/min via INTRAVENOUS

## 2019-04-08 MED ORDER — ROCURONIUM BROMIDE 10 MG/ML (PF) SYRINGE: PREFILLED_SYRINGE | INTRAVENOUS | Status: DC | PRN

## 2019-04-08 MED ORDER — LORATADINE 10 MG PO TABS: 10.0000 mg | ORAL_TABLET | Freq: Every day | ORAL | Status: DC | PRN

## 2019-04-08 MED ORDER — BISACODYL 10 MG RE SUPP: 10.0000 mg | Freq: Every day | RECTAL | Status: DC

## 2019-04-08 MED ORDER — SODIUM CHLORIDE 0.9 % IV SOLN
INTRAVENOUS | Status: DC
  Administered 2019-04-08: 14:00:00 via INTRAVENOUS

## 2019-04-08 MED ORDER — ARTIFICIAL TEARS OPHTHALMIC OINT
TOPICAL_OINTMENT | OPHTHALMIC | Status: AC
  Filled 2019-04-08: qty 3.5

## 2019-04-08 MED ORDER — FAMOTIDINE IN NACL 20-0.9 MG/50ML-% IV SOLN: 20.0000 mg | Freq: Two times a day (BID) | INTRAVENOUS | Status: DC

## 2019-04-08 MED ORDER — CHLORHEXIDINE GLUCONATE 4 % EX LIQD: 30.0000 mL | CUTANEOUS | Status: DC

## 2019-04-08 MED ORDER — SODIUM CHLORIDE 0.9% FLUSH
3.0000 mL | INTRAVENOUS | Status: DC | PRN
  Administered 2019-04-10: 3 mL via INTRAVENOUS
  Filled 2019-04-08: qty 3

## 2019-04-08 MED ORDER — PROTAMINE SULFATE 10 MG/ML IV SOLN
INTRAVENOUS | Status: AC
  Filled 2019-04-08: qty 5

## 2019-04-08 MED ORDER — PHENYLEPHRINE 40 MCG/ML (10ML) SYRINGE FOR IV PUSH (FOR BLOOD PRESSURE SUPPORT)
PREFILLED_SYRINGE | INTRAVENOUS | Status: AC
  Filled 2019-04-08: qty 10

## 2019-04-08 MED ORDER — HEPARIN SODIUM (PORCINE) 1000 UNIT/ML IJ SOLN
INTRAMUSCULAR | Status: DC | PRN
  Administered 2019-04-08: 27000 [IU] via INTRAVENOUS
  Administered 2019-04-08: 2000 [IU] via INTRAVENOUS

## 2019-04-08 MED ORDER — PANTOPRAZOLE SODIUM 40 MG PO TBEC
40.0000 mg | DELAYED_RELEASE_TABLET | Freq: Every day | ORAL | Status: DC
  Administered 2019-04-10 – 2019-04-13 (×4): 40 mg via ORAL
  Filled 2019-04-08 (×4): qty 1

## 2019-04-08 MED ORDER — ONDANSETRON HCL 4 MG/2ML IJ SOLN
4.0000 mg | Freq: Four times a day (QID) | INTRAMUSCULAR | Status: DC | PRN
  Administered 2019-04-08 – 2019-04-10 (×4): 4 mg via INTRAVENOUS
  Filled 2019-04-08 (×4): qty 2

## 2019-04-08 MED ORDER — MIDAZOLAM HCL 2 MG/2ML IJ SOLN
INTRAMUSCULAR | Status: AC
  Filled 2019-04-08: qty 2

## 2019-04-08 MED ORDER — VANCOMYCIN HCL IN DEXTROSE 1-5 GM/200ML-% IV SOLN
1000.0000 mg | Freq: Once | INTRAVENOUS | Status: AC
  Administered 2019-04-08: 1000 mg via INTRAVENOUS
  Filled 2019-04-08: qty 200

## 2019-04-08 MED ORDER — MIDAZOLAM HCL (PF) 10 MG/2ML IJ SOLN
INTRAMUSCULAR | Status: AC
  Filled 2019-04-08: qty 2

## 2019-04-08 MED ORDER — CHLORHEXIDINE GLUCONATE 0.12 % MT SOLN
15.0000 mL | Freq: Once | OROMUCOSAL | Status: AC
  Administered 2019-04-08: 15 mL via OROMUCOSAL
  Filled 2019-04-08: qty 15

## 2019-04-08 MED ORDER — PROTAMINE SULFATE 10 MG/ML IV SOLN
INTRAVENOUS | Status: DC | PRN
  Administered 2019-04-08: 290 mg via INTRAVENOUS

## 2019-04-08 MED ORDER — PROPOFOL 10 MG/ML IV BOLUS
INTRAVENOUS | Status: AC
  Filled 2019-04-08: qty 20

## 2019-04-08 MED ORDER — PHENYLEPHRINE HCL-NACL 20-0.9 MG/250ML-% IV SOLN
0.0000 ug/min | INTRAVENOUS | Status: DC
  Administered 2019-04-08: 20 ug/min via INTRAVENOUS

## 2019-04-08 MED ORDER — ACETAMINOPHEN 650 MG RE SUPP
650.0000 mg | Freq: Once | RECTAL | Status: AC
  Administered 2019-04-08: 650 mg via RECTAL

## 2019-04-08 MED ORDER — ATORVASTATIN CALCIUM 80 MG PO TABS
80.0000 mg | ORAL_TABLET | Freq: Every day | ORAL | Status: DC
  Administered 2019-04-09 – 2019-04-12 (×4): 80 mg via ORAL
  Filled 2019-04-08 (×5): qty 1

## 2019-04-08 MED ORDER — KETOROLAC TROMETHAMINE 15 MG/ML IJ SOLN
30.0000 mg | Freq: Four times a day (QID) | INTRAMUSCULAR | Status: AC
  Administered 2019-04-08: 30 mg via INTRAVENOUS
  Filled 2019-04-08: qty 2

## 2019-04-08 MED ORDER — 0.9 % SODIUM CHLORIDE (POUR BTL) OPTIME
TOPICAL | Status: DC | PRN
  Administered 2019-04-08: 6000 mL

## 2019-04-08 MED ORDER — METOPROLOL TARTRATE 12.5 MG HALF TABLET
12.5000 mg | ORAL_TABLET | Freq: Two times a day (BID) | ORAL | Status: DC
  Administered 2019-04-09 – 2019-04-13 (×6): 12.5 mg via ORAL
  Filled 2019-04-08 (×8): qty 1

## 2019-04-08 MED ORDER — KETOROLAC TROMETHAMINE 15 MG/ML IJ SOLN
15.0000 mg | Freq: Four times a day (QID) | INTRAMUSCULAR | Status: DC
  Administered 2019-04-08: 15 mg via INTRAVENOUS
  Filled 2019-04-08 (×2): qty 1

## 2019-04-08 MED ORDER — HEPARIN SODIUM (PORCINE) 1000 UNIT/ML IJ SOLN
INTRAMUSCULAR | Status: AC
  Filled 2019-04-08: qty 2

## 2019-04-08 MED ORDER — SODIUM CHLORIDE 0.9 % IV SOLN
250.0000 mL | INTRAVENOUS | Status: DC
  Administered 2019-04-10: 250 mL via INTRAVENOUS

## 2019-04-08 MED ORDER — LACTATED RINGERS IV SOLN
INTRAVENOUS | Status: DC | PRN
  Administered 2019-04-08: 07:00:00 via INTRAVENOUS

## 2019-04-08 MED ORDER — CHLORHEXIDINE GLUCONATE 0.12 % MT SOLN
15.0000 mL | OROMUCOSAL | Status: AC
  Administered 2019-04-08: 15 mL via OROMUCOSAL

## 2019-04-08 MED ORDER — INSULIN REGULAR(HUMAN) IN NACL 100-0.9 UT/100ML-% IV SOLN: INTRAVENOUS | Status: DC

## 2019-04-08 MED ORDER — LACTATED RINGERS IV SOLN
INTRAVENOUS | Status: DC
  Administered 2019-04-09: 23:00:00 via INTRAVENOUS

## 2019-04-08 MED ORDER — SODIUM CHLORIDE 0.9 % IV SOLN
1.5000 g | Freq: Two times a day (BID) | INTRAVENOUS | Status: DC
  Administered 2019-04-08 – 2019-04-10 (×4): 1.5 g via INTRAVENOUS
  Filled 2019-04-08 (×4): qty 1.5

## 2019-04-08 MED ORDER — PROTAMINE SULFATE 10 MG/ML IV SOLN
INTRAVENOUS | Status: AC
  Filled 2019-04-08: qty 25

## 2019-04-08 MED ORDER — ASPIRIN EC 325 MG PO TBEC
325.0000 mg | DELAYED_RELEASE_TABLET | Freq: Every day | ORAL | Status: DC
  Administered 2019-04-09 – 2019-04-13 (×5): 325 mg via ORAL
  Filled 2019-04-08 (×5): qty 1

## 2019-04-08 MED ORDER — DEXMEDETOMIDINE HCL IN NACL 200 MCG/50ML IV SOLN: 0.0000 ug/kg/h | INTRAVENOUS | Status: DC

## 2019-04-08 MED ORDER — ASPIRIN 81 MG PO CHEW
324.0000 mg | CHEWABLE_TABLET | Freq: Every day | ORAL | Status: DC
  Filled 2019-04-08: qty 4

## 2019-04-08 MED ORDER — METOCLOPRAMIDE HCL 5 MG/ML IJ SOLN
10.0000 mg | Freq: Four times a day (QID) | INTRAMUSCULAR | Status: AC
  Administered 2019-04-08 – 2019-04-09 (×4): 10 mg via INTRAVENOUS
  Filled 2019-04-08 (×3): qty 2

## 2019-04-08 MED ORDER — HEMOSTATIC AGENTS (NO CHARGE) OPTIME
TOPICAL | Status: DC | PRN
  Administered 2019-04-08 (×2): 1 via TOPICAL

## 2019-04-08 MED ORDER — METOPROLOL TARTRATE 25 MG/10 ML ORAL SUSPENSION
12.5000 mg | Freq: Two times a day (BID) | ORAL | Status: DC
  Filled 2019-04-08 (×4): qty 5

## 2019-04-08 MED ORDER — ACETAMINOPHEN 160 MG/5ML PO SOLN: 650.0000 mg | Freq: Once | ORAL | Status: AC

## 2019-04-08 MED ORDER — MORPHINE SULFATE (PF) 2 MG/ML IV SOLN
1.0000 mg | INTRAVENOUS | Status: DC | PRN
  Administered 2019-04-08 – 2019-04-09 (×4): 2 mg via INTRAVENOUS
  Filled 2019-04-08 (×5): qty 1

## 2019-04-08 MED ORDER — MAGNESIUM SULFATE 4 GM/100ML IV SOLN
4.0000 g | Freq: Once | INTRAVENOUS | Status: AC
  Administered 2019-04-08: 4 g via INTRAVENOUS
  Filled 2019-04-08: qty 100

## 2019-04-08 MED ORDER — LACTATED RINGERS IV SOLN: INTRAVENOUS | Status: DC

## 2019-04-08 MED ORDER — FENTANYL CITRATE (PF) 250 MCG/5ML IJ SOLN
INTRAMUSCULAR | Status: AC
  Filled 2019-04-08: qty 5

## 2019-04-08 MED ORDER — LACTATED RINGERS IV SOLN: 500.0000 mL | Freq: Once | INTRAVENOUS | Status: DC | PRN

## 2019-04-08 MED ORDER — METOPROLOL TARTRATE 5 MG/5ML IV SOLN
2.5000 mg | INTRAVENOUS | Status: DC | PRN
  Administered 2019-04-08 (×2): 2.5 mg via INTRAVENOUS

## 2019-04-08 MED ORDER — FENTANYL CITRATE (PF) 250 MCG/5ML IJ SOLN
INTRAMUSCULAR | Status: AC
  Filled 2019-04-08: qty 20

## 2019-04-08 MED ORDER — ACETAMINOPHEN 500 MG PO TABS
1000.0000 mg | ORAL_TABLET | Freq: Four times a day (QID) | ORAL | Status: DC
  Administered 2019-04-09 – 2019-04-13 (×17): 1000 mg via ORAL
  Filled 2019-04-08 (×18): qty 2

## 2019-04-08 SURGICAL SUPPLY — 102 items
ADAPTER CARDIO PERF ANTE/RETRO (ADAPTER) ×4 IMPLANT
BAG DECANTER FOR FLEXI CONT (MISCELLANEOUS) ×4 IMPLANT
BANDAGE ACE 4X5 VEL STRL LF (GAUZE/BANDAGES/DRESSINGS) ×4 IMPLANT
BANDAGE ACE 6X5 VEL STRL LF (GAUZE/BANDAGES/DRESSINGS) ×4 IMPLANT
BANDAGE ELASTIC 4 VELCRO ST LF (GAUZE/BANDAGES/DRESSINGS) ×2 IMPLANT
BANDAGE ELASTIC 6 VELCRO ST LF (GAUZE/BANDAGES/DRESSINGS) ×1 IMPLANT
BASKET HEART  (ORDER IN 25'S) (MISCELLANEOUS) ×1
BASKET HEART (ORDER IN 25'S) (MISCELLANEOUS) ×1
BASKET HEART (ORDER IN 25S) (MISCELLANEOUS) ×2 IMPLANT
BLADE CLIPPER SURG (BLADE) IMPLANT
BLADE STERNUM SYSTEM 6 (BLADE) ×4 IMPLANT
BLADE SURG 11 STRL SS (BLADE) ×2 IMPLANT
BLADE SURG 12 STRL SS (BLADE) ×4 IMPLANT
BNDG GAUZE ELAST 4 BULKY (GAUZE/BANDAGES/DRESSINGS) ×4 IMPLANT
CANISTER SUCT 3000ML PPV (MISCELLANEOUS) ×4 IMPLANT
CANNULA GUNDRY RCSP 15FR (MISCELLANEOUS) ×4 IMPLANT
CATH CPB KIT VANTRIGT (MISCELLANEOUS) ×4 IMPLANT
CATH ROBINSON RED A/P 18FR (CATHETERS) ×12 IMPLANT
CATH THORACIC 36FR RT ANG (CATHETERS) ×4 IMPLANT
CLIP RETRACTION 3.0MM CORONARY (MISCELLANEOUS) ×2 IMPLANT
COVER WAND RF STERILE (DRAPES) ×2 IMPLANT
CRADLE DONUT ADULT HEAD (MISCELLANEOUS) ×4 IMPLANT
DEFOGGER ANTIFOG KIT (MISCELLANEOUS) ×2 IMPLANT
DERMABOND ADVANCED (GAUZE/BANDAGES/DRESSINGS) ×2
DERMABOND ADVANCED .7 DNX12 (GAUZE/BANDAGES/DRESSINGS) IMPLANT
DRAIN CHANNEL 32F RND 10.7 FF (WOUND CARE) ×4 IMPLANT
DRAPE CARDIOVASCULAR INCISE (DRAPES) ×2
DRAPE SLUSH/WARMER DISC (DRAPES) ×4 IMPLANT
DRAPE SRG 135X102X78XABS (DRAPES) ×2 IMPLANT
DRSG AQUACEL AG ADV 3.5X14 (GAUZE/BANDAGES/DRESSINGS) ×4 IMPLANT
ELECT BLADE 4.0 EZ CLEAN MEGAD (MISCELLANEOUS) ×4
ELECT BLADE 6.5 EXT (BLADE) ×2 IMPLANT
ELECT CAUTERY BLADE 6.4 (BLADE) ×4 IMPLANT
ELECT REM PT RETURN 9FT ADLT (ELECTROSURGICAL) ×8
ELECTRODE BLDE 4.0 EZ CLN MEGD (MISCELLANEOUS) ×2 IMPLANT
ELECTRODE REM PT RTRN 9FT ADLT (ELECTROSURGICAL) ×4 IMPLANT
FELT TEFLON 1X6 (MISCELLANEOUS) ×8 IMPLANT
GAUZE SPONGE 4X4 12PLY STRL (GAUZE/BANDAGES/DRESSINGS) ×8 IMPLANT
GLOVE BIO SURGEON STRL SZ 6.5 (GLOVE) ×11 IMPLANT
GLOVE BIO SURGEON STRL SZ7.5 (GLOVE) ×12 IMPLANT
GLOVE BIO SURGEONS STRL SZ 6.5 (GLOVE) ×11
GLOVE BIOGEL PI IND STRL 6 (GLOVE) IMPLANT
GLOVE BIOGEL PI IND STRL 6.5 (GLOVE) IMPLANT
GLOVE BIOGEL PI INDICATOR 6 (GLOVE) ×8
GLOVE BIOGEL PI INDICATOR 6.5 (GLOVE) ×8
GOWN STRL REUS W/ TWL LRG LVL3 (GOWN DISPOSABLE) ×8 IMPLANT
GOWN STRL REUS W/TWL LRG LVL3 (GOWN DISPOSABLE) ×16
HEMOSTAT POWDER SURGIFOAM 1G (HEMOSTASIS) ×12 IMPLANT
HEMOSTAT SURGICEL 2X14 (HEMOSTASIS) ×4 IMPLANT
INSERT FOGARTY XLG (MISCELLANEOUS) IMPLANT
KIT BASIN OR (CUSTOM PROCEDURE TRAY) ×4 IMPLANT
KIT SUCTION CATH 14FR (SUCTIONS) ×4 IMPLANT
KIT TURNOVER KIT B (KITS) ×4 IMPLANT
KIT VASOVIEW HEMOPRO 2 VH 4000 (KITS) ×4 IMPLANT
LEAD PACING MYOCARDI (MISCELLANEOUS) ×4 IMPLANT
MARKER GRAFT CORONARY BYPASS (MISCELLANEOUS) ×12 IMPLANT
NS IRRIG 1000ML POUR BTL (IV SOLUTION) ×22 IMPLANT
PACK E OPEN HEART (SUTURE) ×4 IMPLANT
PACK OPEN HEART (CUSTOM PROCEDURE TRAY) ×4 IMPLANT
PAD ARMBOARD 7.5X6 YLW CONV (MISCELLANEOUS) ×8 IMPLANT
PAD ELECT DEFIB RADIOL ZOLL (MISCELLANEOUS) ×4 IMPLANT
PENCIL BUTTON HOLSTER BLD 10FT (ELECTRODE) ×4 IMPLANT
PUNCH AORTIC ROTATE 4.0MM (MISCELLANEOUS) ×2 IMPLANT
PUNCH AORTIC ROTATE 4.5MM 8IN (MISCELLANEOUS) IMPLANT
PUNCH AORTIC ROTATE 5MM 8IN (MISCELLANEOUS) IMPLANT
SET CARDIOPLEGIA MPS 5001102 (MISCELLANEOUS) ×2 IMPLANT
SPONGE LAP 18X18 X RAY DECT (DISPOSABLE) ×2 IMPLANT
SPONGE LAP 4X18 RFD (DISPOSABLE) ×2 IMPLANT
SURGIFLO W/THROMBIN 8M KIT (HEMOSTASIS) ×4 IMPLANT
SUT BONE WAX W31G (SUTURE) ×4 IMPLANT
SUT MNCRL AB 4-0 PS2 18 (SUTURE) ×2 IMPLANT
SUT PROLENE 3 0 SH DA (SUTURE) IMPLANT
SUT PROLENE 3 0 SH1 36 (SUTURE) IMPLANT
SUT PROLENE 4 0 RB 1 (SUTURE) ×2
SUT PROLENE 4 0 SH DA (SUTURE) ×10 IMPLANT
SUT PROLENE 4-0 RB1 .5 CRCL 36 (SUTURE) ×2 IMPLANT
SUT PROLENE 5 0 C 1 36 (SUTURE) IMPLANT
SUT PROLENE 6 0 C 1 30 (SUTURE) ×2 IMPLANT
SUT PROLENE 6 0 CC (SUTURE) ×12 IMPLANT
SUT PROLENE 8 0 BV175 6 (SUTURE) IMPLANT
SUT PROLENE BLUE 7 0 (SUTURE) ×4 IMPLANT
SUT SILK  1 MH (SUTURE)
SUT SILK 1 MH (SUTURE) IMPLANT
SUT SILK 2 0 SH CR/8 (SUTURE) ×2 IMPLANT
SUT SILK 3 0 SH CR/8 (SUTURE) IMPLANT
SUT STEEL 6MS V (SUTURE) ×8 IMPLANT
SUT STEEL SZ 6 DBL 3X14 BALL (SUTURE) ×6 IMPLANT
SUT VIC AB 1 CTX 36 (SUTURE) ×6
SUT VIC AB 1 CTX36XBRD ANBCTR (SUTURE) ×4 IMPLANT
SUT VIC AB 2-0 CT1 27 (SUTURE) ×2
SUT VIC AB 2-0 CT1 TAPERPNT 27 (SUTURE) IMPLANT
SUT VIC AB 2-0 CTX 27 (SUTURE) IMPLANT
SUT VIC AB 3-0 X1 27 (SUTURE) IMPLANT
SYSTEM SAHARA CHEST DRAIN ATS (WOUND CARE) ×4 IMPLANT
TAPE CLOTH SURG 4X10 WHT LF (GAUZE/BANDAGES/DRESSINGS) ×2 IMPLANT
TAPE PAPER 2X10 WHT MICROPORE (GAUZE/BANDAGES/DRESSINGS) ×2 IMPLANT
TOWEL GREEN STERILE (TOWEL DISPOSABLE) ×4 IMPLANT
TOWEL GREEN STERILE FF (TOWEL DISPOSABLE) ×2 IMPLANT
TRAY FOLEY SLVR 16FR TEMP STAT (SET/KITS/TRAYS/PACK) ×4 IMPLANT
TUBING LAP HI FLOW INSUFFLATIO (TUBING) ×4 IMPLANT
UNDERPAD 30X30 (UNDERPADS AND DIAPERS) ×4 IMPLANT
WATER STERILE IRR 1000ML POUR (IV SOLUTION) ×8 IMPLANT

## 2019-04-08 NOTE — Brief Op Note (Signed)
04/08/2019  9:40 AM  PATIENT:  [name]  53 y.o. female  PRE-OPERATIVE DIAGNOSIS:  CAD  POST-OPERATIVE DIAGNOSIS:  CAD  PROCEDURE:  Procedure(s):  CORONARY ARTERY BYPASS GRAFTING x 2 -LIMA to LAD -SVG to DIAGONAL  ENDOSCOPIC HARVEST GREATER SAPHENOUS VEIN -Right Thigh  TRANSESOPHAGEAL ECHOCARDIOGRAM (TEE) (N/A)  SURGEON:  Surgeon(s) and Role:    Ivin Poot, MD - Primary  PHYSICIAN ASSISTANT: Ellwood Handler PA-C  ANESTHESIA:   general  EBL:  650 mL   BLOOD ADMINISTERED:1 unit PRBC and  CELLSAVER  DRAINS: Left Pleural Chest Tube, Mediastinal Chest Drains   LOCAL MEDICATIONS USED:  NONE  SPECIMEN:  No Specimen  DISPOSITION OF SPECIMEN:  N/A  COUNTS:  YES  TOURNIQUET:  * No tourniquets in log *  DICTATION: .Dragon Dictation  PLAN OF CARE: Admit to inpatient   PATIENT DISPOSITION:  ICU - intubated and hemodynamically stable.   Delay start of Pharmacological VTE agent (>24hrs) due to surgical blood loss or risk of bleeding: yes

## 2019-04-08 NOTE — Transfer of Care (Signed)
Immediate Anesthesia Transfer of Care Note  Patient: [name]  Procedure(s) Performed: CORONARY ARTERY BYPASS GRAFTING (CABG) x two , using left internal mammary artery and right leg greater saphenous vein harvested endoscopically (N/A Chest) TRANSESOPHAGEAL ECHOCARDIOGRAM (TEE) (N/A )  Patient Location: ICU  Anesthesia Type:General  Level of Consciousness: Patient remains intubated per anesthesia plan  Airway & Oxygen Therapy: Patient remains intubated per anesthesia plan and Patient placed on Ventilator (see vital sign flow sheet for setting)  Post-op Assessment: Report given to RN and Post -op Vital signs reviewed and stable  Post vital signs: Reviewed and stable  Last Vitals:  Vitals Value Taken Time  BP    Temp    Pulse 80 04/08/19 1328  Resp 16 04/08/19 1328  SpO2 99 % 04/08/19 1328  Vitals shown include unvalidated device data.  Last Pain:  Vitals:   04/08/19 0607  TempSrc: Oral  PainSc: 0-No pain         Complications: No apparent anesthesia complications

## 2019-04-08 NOTE — Anesthesia Procedure Notes (Addendum)
Central Venous Catheter Insertion Performed by: anesthesiologist Start/End6/22/2020 6:40 AM, 04/08/2019 6:55 AM Preanesthetic checklist: patient identified, IV checked, site marked, risks and benefits discussed, surgical consent, monitors and equipment checked, pre-op evaluation and timeout performed Position: Trendelenburg Patient sedated Hand hygiene performed , maximum sterile barriers used  and Seldinger technique used PA cath was placed.Sheath introducer Swan type:thermodilution Procedure performed using ultrasound guided technique. Ultrasound Notes:anatomy identified Attempts: 1 Following insertion, line sutured, dressing applied and Biopatch. Post procedure assessment: blood return through all ports  Patient tolerated the procedure well with no immediate complications.

## 2019-04-08 NOTE — Telephone Encounter (Signed)
Please advise if you will take pt on has a NP

## 2019-04-08 NOTE — Anesthesia Procedure Notes (Addendum)
Procedure Name: Intubation Date/Time: 04/08/2019 8:19 AM Performed by: Cleda Daub, CRNA Pre-anesthesia Checklist: Patient identified, Emergency Drugs available, Suction available and Patient being monitored Patient Re-evaluated:Patient Re-evaluated prior to induction Oxygen Delivery Method: Circle system utilized Preoxygenation: Pre-oxygenation with 100% oxygen Induction Type: IV induction Ventilation: Mask ventilation without difficulty, Mask ventilation throughout procedure and Oral airway inserted - appropriate to patient size Laryngoscope Size: Glidescope and 3 Grade View: Grade I Tube type: Oral Tube size: 7.0 mm Number of attempts: 1 Airway Equipment and Method: Stylet and Video-laryngoscopy Placement Confirmation: ETT inserted through vocal cords under direct vision,  positive ETCO2 and breath sounds checked- equal and bilateral Secured at: 20 cm Tube secured with: Tape Dental Injury: Teeth and Oropharynx as per pre-operative assessment

## 2019-04-08 NOTE — Progress Notes (Signed)
  Echocardiogram Echocardiogram Transesophageal has been performed.  [name] 04/08/2019, 9:09 AM

## 2019-04-08 NOTE — Progress Notes (Signed)
CT surgery p.m. Rounds  Status post CABG x2 Extubated 4 PM, 1 hour ABG satisfactory Stable hemodynamics Sinus rhythm

## 2019-04-08 NOTE — Anesthesia Procedure Notes (Signed)
Arterial Line Insertion Start/End6/22/2020 7:04 AM, 04/08/2019 7:35 AM Performed by: Annye Asa, MD, anesthesiologist  Patient location: Pre-op. Preanesthetic checklist: patient identified, IV checked, risks and benefits discussed, surgical consent, monitors and equipment checked, pre-op evaluation, timeout performed and anesthesia consent Lidocaine 1% used for infiltration and patient sedated Right, brachial was placed Catheter size: 20 G Hand hygiene performed  and Seldinger technique used  Attempts: 1 (after several attempts radially) Procedure performed without using ultrasound guided technique. Ultrasound Notes:anatomy identified, needle tip was noted to be adjacent to the nerve/plexus identified and no ultrasound evidence of intravascular and/or intraneural injection Following insertion, line sutured, dressing applied and Biopatch. Patient tolerated the procedure well with no immediate complications. Additional procedure comments: Arterial: After several attempt to both radial arteries, able to enter artery, unable to thread catheter x multiple attempts. Sterile prep, drape, FBP R antecub. 1% lido local, trocar brachial artery, catheter placed over J wire. Biopatch and sterile dressing on.  Patient tolerated well.  VSS.  Jenita Seashore, MD.

## 2019-04-08 NOTE — Telephone Encounter (Signed)
Patient is currently admitted for CABG procedure today - will watch for discharge and call to schedule NP appointment.

## 2019-04-08 NOTE — Anesthesia Procedure Notes (Deleted)
Arterial Line Insertion Start/End6/22/2020 7:05 AM, 04/08/2019 7:50 AM Performed by: Annye Asa, MD, anesthesiologist  Patient location: Pre-op. Preanesthetic checklist: patient identified, IV checked, site marked, risks and benefits discussed, surgical consent, monitors and equipment checked, pre-op evaluation and anesthesia consent Lidocaine 1% used for infiltration and patient sedated Right, brachial was placed Catheter size: 20 G Hand hygiene performed , maximum sterile barriers used  and Seldinger technique used Allen's test indicative of satisfactory collateral circulation Attempts: 4 Procedure performed using ultrasound guided technique. Ultrasound Notes:anatomy identified, needle tip was noted to be adjacent to the nerve/plexus identified and no ultrasound evidence of intravascular and/or intraneural injection Following insertion, Biopatch and dressing applied. Post procedure assessment: normal  Additional procedure comments: Difficult arterial line placement; CRNA attempted to place on the left radial x1; unable to thread; Dr. Glennon Mac placed one in the right brachial artery successfully; pt tolerated. Sedation and pain meds given during the procedure. Marland Kitchen

## 2019-04-08 NOTE — Procedures (Signed)
Extubation Procedure Note  Patient Details:   Name: [name] DOB: 26-Sep-1966 MRN: [id number]   Airway Documentation:    Vent end date: 04/08/19 Vent end time: 1610   Evaluation  O2 sats: stable throughout Complications: No apparent complications Patient did tolerate procedure well. Bilateral Breath Sounds: Clear   Pt extubated per Rapid wean protocol.  NIF -35 VC 850 +cuff leak   Ciro Backer 04/08/2019, 4:18 PM

## 2019-04-08 NOTE — H&P (Signed)
PCP is [name], MD Referring Provider is No ref. provider found  No chief complaint on file. Patient examined, images of recent coronary angiogram personally reviewed and counseled with patient.    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  In March 2020 patient had an acute anterior MI with a 99% proximal LAD stenosis treated with PCI.  There was a residual ostial LAD stenosis but the patient responded clinically and LV function improved.  She is placed on Brilinta and follow-up.  She was able to exercise without angina until recently when she developed chest pain and pressure while going uphill on her neighborhood walks.  She underwent repeat catheterization by Dr. Tamala Julian.  The ostial LAD stenosis is now 80 to 90%.  PCI is patent.  There is a first diagonal branch of the LAD which is underperfused because of the PCI stent.  The patient denies symptoms of heart failure.  She is in sinus rhythm.  He has had minimal bleeding problems from the [place].  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   Past Medical History:  Diagnosis Date  . Acute non-ST elevation myocardial infarction (NSTEMI) ([place]) 12/16/2018  . CAD S/P DES PCI -pLAD 12/16/2018   Cath 12/15/2017: Successful proximal LAD synergy 3.0 x 15 DES postdilated to 3.25 mm reducing the stenosis to 0% ; ? 60-70% ostial LAD noted on some images.  Marland Kitchen PONV (postoperative nausea and vomiting)     Past Surgical History:  Procedure Laterality Date  . CESAREAN SECTION    . CORONARY/GRAFT ACUTE MI REVASCULARIZATION N/A 12/16/2018   Procedure: Coronary/Graft Acute MI Revascularization;  Surgeon: Belva Crome, MD;  Location: [facility];  Service: Cardiovascular;  Laterality: N/A;  . IUD REMOVAL N/A 05/16/2013   Procedure: INTRAUTERINE DEVICE (IUD) REMOVAL;  Surgeon: Darlyn Chamber, MD;  Location: [facility];  Service: Gynecology;  Laterality:  N/A;  . LAPAROSCOPIC TUBAL LIGATION Bilateral 05/16/2013   Procedure: LAPAROSCOPIC TUBAL LIGATION;  Surgeon: Darlyn Chamber, MD;  Location: [facility];  Service: Gynecology;  Laterality: Bilateral;  . LEFT HEART CATH AND CORONARY ANGIOGRAPHY N/A 12/16/2018   Procedure: LEFT HEART CATH AND CORONARY ANGIOGRAPHY;  Surgeon: Belva Crome, MD;  Location: [facility];  Service: Cardiovascular;  Laterality: N/A;  . LEFT HEART CATH AND CORONARY ANGIOGRAPHY N/A 03/26/2019   Procedure: LEFT HEART CATH AND CORONARY ANGIOGRAPHY;  Surgeon: Belva Crome, MD;  Location: [facility];  Service: Cardiovascular;  Laterality: N/A;    Family History  Problem Relation Age of Onset  . Heart disease Other   . Colon cancer Other   . Heart attack Other   . Colon cancer Father   . High blood pressure Mother   . Heart attack Mother        2 stents  . CAD Mother   . Hyperlipidemia Mother   . Hyperlipidemia Sister   . Heart failure Maternal Grandmother   . Heart attack Maternal Grandfather        deceased 62  . Cancer Paternal Grandfather     Social History Social History   Tobacco Use  . Smoking status: Never Smoker  . Smokeless tobacco: Never Used  Substance Use Topics  . Alcohol use: Yes    Comment:  once a month  . Drug use: No    Current Facility-Administered Medications  Medication Dose Route Frequency Provider  Last Rate Last Dose  . 0.9 % irrigation (POUR BTL)    PRN Prescott Gum, Collier Salina, MD   6,000 mL at 04/08/19 0723  . cefUROXime (ZINACEF) 1.5 g in sodium chloride 0.9 % 100 mL IVPB  1.5 g Intravenous To OR Prescott Gum, Collier Salina, MD      . chlorhexidine (HIBICLENS) 4 % liquid 2 application  30 mL Topical UD Prescott Gum, Collier Salina, MD      . dexmedetomidine (PRECEDEX) 400 MCG/100ML (4 mcg/mL) infusion  0.1-0.7 mcg/kg/hr Intravenous To OR Prescott Gum, Collier Salina, MD      . DOPamine (INTROPIN) 800 mg in dextrose 5 % 250 mL (3.2 mg/mL) infusion  0-10 mcg/kg/min Intravenous To OR Prescott Gum, Collier Salina, MD      .  EPINEPHrine (ADRENALIN) 4 mg in dextrose 5 % 250 mL (0.016 mg/mL) infusion  0-10 mcg/min Intravenous To OR Ivin Poot, MD      . hemostatic agents    PRN Prescott Gum, Collier Salina, MD   1 application at 42/68/34 [phone]  . heparin 2,500 Units, papaverine 30 mg in electrolyte-148 (PLASMALYTE-148) 500 mL irrigation    PRN Prescott Gum, Collier Salina, MD   500 mL at 04/08/19 2297  . heparin 30,000 units/NS 1000 mL solution for CELLSAVER   Other To OR Prescott Gum, Collier Salina, MD      . insulin regular, human (MYXREDLIN) 100 units/ 100 mL infusion   Intravenous To OR Prescott Gum, Collier Salina, MD      . magnesium sulfate (IV Push/IM) injection 40 mEq  40 mEq Other To OR Prescott Gum, Collier Salina, MD      . milrinone (PRIMACOR) 20 MG/100 ML (0.2 mg/mL) infusion  0.3 mcg/kg/min Intravenous To OR Prescott Gum, Collier Salina, MD      . nitroGLYCERIN 50 mg in dextrose 5 % 250 mL (0.2 mg/mL) infusion  2-200 mcg/min Intravenous To OR Prescott Gum, Collier Salina, MD      . norepinephrine (LEVOPHED) 4mg  in 210mL premix infusion  0-40 mcg/min Intravenous To OR Prescott Gum, Collier Salina, MD      . phenylephrine (NEOSYNEPHRINE) 20-0.9 MG/250ML-% infusion  30-200 mcg/min Intravenous To OR Prescott Gum, Collier Salina, MD      . potassium chloride injection 80 mEq  80 mEq Other To OR Prescott Gum, Collier Salina, MD      . Surgifoam 1 Gm with 0.9% sodium chloride (4 ml) topical solution    PRN Prescott Gum, Collier Salina, MD   4 mL at 04/08/19 0725  . tranexamic acid (CYKLOKAPRON) 2,500 mg in sodium chloride 0.9 % 250 mL (10 mg/mL) infusion  1.5 mg/kg/hr Intravenous To OR Prescott Gum, Collier Salina, MD      . tranexamic acid (CYKLOKAPRON) bolus via infusion - over 30 minutes 1,234.5 mg  15 mg/kg Intravenous To OR Prescott Gum, Collier Salina, MD      . tranexamic acid (CYKLOKAPRON) pump prime solution 165 mg  2 mg/kg Intracatheter To OR Prescott Gum, Collier Salina, MD      . vancomycin (VANCOCIN) 1,250 mg in sodium chloride 0.9 % 250 mL IVPB  1,250 mg Intravenous To OR Ivin Poot, MD       Facility-Administered Medications Ordered in Other Encounters   Medication Dose Route Frequency Provider Last Rate Last Dose  . fentaNYL (SUBLIMAZE) injection   Intravenous Anesthesia Intra-op Cleda Daub, CRNA   50 mcg at 04/08/19 0710  . midazolam (VERSED) 5 MG/5ML injection    Anesthesia Intra-op Cleda Daub, CRNA   1 mg at 04/08/19 0710    Allergies  Allergen Reactions  .  Tetracycline Rash    Review of Systems                     Review of Systems :  [ y ] = yes, [  ] = no        General :  Weight gain [   ]    Weight loss  [   ]  Fatigue [ y ]  Fever [  ]  Chills  [  ]                                          HEENT    Headache [  ]  Dizziness [  ]  Blurred vision [  ] Glaucoma  [  ]                          Nosebleeds [  ] Painful or loose teeth [  ]        Cardiac :  Chest pain/ pressure Blue.Reese  ]  Resting SOB [  ] exertional SOB [ y ]                        Orthopnea [  ]  Pedal edema  [  ]  Palpitations [  ] Syncope/presyncope [ ]                         Paroxysmal nocturnal dyspnea [  ]         Pulmonary : cough [  ]  wheezing [  ]  Hemoptysis [  ] Sputum [  ] Snoring [  ]                              Pneumothorax [  ]  Sleep apnea [  ]        GI : Vomiting [  ]  Dysphagia [  ]  Melena  [  ]  Abdominal pain [  ] BRBPR [  ]              Heart burn [  ]  Constipation [  ] Diarrhea  [  ] Colonoscopy [   ]        GU : Hematuria [  ]  Dysuria [  ]  Nocturia [  ] UTI's [  ]        Vascular : Claudication [  ]  Rest pain [  ]  DVT [  ] Vein stripping [  ] leg ulcers [  ]                          TIA [  ] Stroke [  ]  Varicose veins [  ]        NEURO :  Headaches  [  ] Seizures [  ] Vision changes [  ] Paresthesias [  ]                                               Musculoskeletal :  Arthritis [  ]  Gout  [  ]  Back pain [  ]  Joint pain [  ]        Skin :  Rash [  ]  Melanoma [  ] Sores [  ]        Heme : Bleeding problems [  ]Clotting Disorders [  ] Anemia [  ]Blood Transfusion [ ]         Endocrine : Diabetes [  ] Heat or Cold  intolerance [  ] Polyuria [  ]excessive thirst [ ]         Psych : Depression [  ]  Anxiety [  ]  Psych hospitalizations [  ] Memory change [  ]                                               BP 131/83   Pulse 71   Temp 98.4 F (36.9 C) (Oral)   SpO2 98%  Physical Exam       Exam    General- alert and comfortable, overweight young female in no distress    Neck- no JVD, no cervical adenopathy palpable, no carotid bruit   Lungs- clear without rales, wheezes   Cor- regular rate and rhythm, no murmur , gallop   Abdomen- soft, non-tender   Extremities - warm, non-tender, minimal edema.  No hematoma at cardiac cath site right wrist   Neuro- oriented, appropriate, no focal weakness   Diagnostic Tests: Coronary angiogram show a significant ostial LAD stenosis.  The circumflex vessel is well perfused.  The first diagonal branch of the LAD is underperfused because of the previously placed PCI. EF is 50% with normal LVEDP   Impression: Symptomatic ostial LAD stenosis-class III angina with fairly well preserved LV function and history of recent anterior MI  Plan: Best long-term therapy for this patient would be placement of left IMA to LAD.  Grafting the underperfused first diagonal would also be indicated if the vessel is significant.  Patient will stop Brilinta 4 days prior to surgery will be scheduled for CABG x2 on Monday, June 22 at [facility]. I discussed the details of CABG with patient including benefits and risks and expected recovery.  She understands that there will be limitation on visitors due to the COVID-19 hospital regulations.   Len Childs, MD [facility]  04-08-2019  No new symptoms preop labs all reviewed and are satisfactory Patient examined in preop and all questions addressed  Ivin Poot MD

## 2019-04-08 NOTE — Telephone Encounter (Signed)
Ok to establish 

## 2019-04-08 NOTE — Op Note (Signed)
NAME: [name], [name] MEDICAL RECORD [id number] ACCOUNT [id number] DATE OF BIRTH:11-07-1965 FACILITY: [facility] LOCATION: [facility] PHYSICIAN:Chryl Holten VAN TRIGT III, MD  OPERATIVE REPORT  DATE OF PROCEDURE:  04/08/2019  OPERATION: 1.  Coronary artery bypass grafting x2 (left internal mammary artery to left anterior descending, saphenous vein graft to first diagonal). 2.  Endoscopic harvest of right leg greater saphenous vein.  SURGEON:  Ivin Poot, MD  ASSISTANT:  Ellwood Handler, PA-C  ANESTHESIA:  General by Dr. Annye Asa.  PREOPERATIVE DIAGNOSIS:  History of anterior MI with high-grade proximal LAD stenosis treated with PCI 01/04/2019.  Now with recurrent symptoms of accelerating angina and stenosis of the LAD ostium with a patent stent.  Obstruction of the first diagonal  from the previously placed stent.  Preserved ventricular function.  POSTOPERATIVE DIAGNOSIS:  History of anterior MI with high-grade proximal LAD stenosis treated with PCI 01/04/2019.  Now with recurrent symptoms of accelerating angina and stenosis of the LAD ostium with a patent stent.  Obstruction of the first diagonal  from the previously placed stent.  Preserved ventricular function.  CLINICAL PROCEDURE:  After the patient was seen in the office and a full consultation completed including assessment of the 2 coronary arteriograms and her previous echocardiogram images were reviewed, I agreed with the patient's cardiologist that  surgical CABG would be her best long-term therapy rather than an attempt at a second PCI of the ostium of the LAD.  I discussed the benefits, risks and alternatives of CABG with the patient.  She understood the details of the procedure including the use  of general anesthesia and cardiopulmonary bypass, the location of the surgical incisions, and the expected postoperative hospital recovery.  The patient understood the risks to include the risks of stroke, bleeding, blood transfusion  requirement,  postoperative infection, postoperative organ failure, and death.  She demonstrated her understanding and agreed to proceed with surgery under what I felt was an informed consent.  OPERATIVE FINDINGS: 1.  Adequate conduit, although the left IMA was 1.4 mm diameter vessel. 2.  Small targets for grafting. 3.  Preserved left ventricular systolic function.  DESCRIPTION OF PROCEDURE:  The patient was brought from preop holding where informed consent was documented and final questions were addressed.  The patient was placed supine on the operating table and general anesthesia was induced.  A transesophageal  echo probe was placed by the anesthesiologist.  A proper time-out was performed.  The chest, abdomen and legs were prepped with Betadine, draped as a sterile field.  A sternal incision was made as the saphenous vein was harvested endoscopically from the  right leg.  The left internal mammary artery was harvested as a pedicle graft from its origin at the subclavian vessels.  It was a 1.4 mm vessel with good flow.  The sternal retractor was placed using the deep blades because of the patient's short, obese  body habitus.  The pericardium was opened and suspended.  Pursestrings were placed in the ascending aorta and right atrium.  The patient was heparinized.  The patient was then cannulated and placed on cardiopulmonary bypass.  The distal third of the LAD  and the proximal first diagonal were found to be small, but adequate targets for grafting.  Cardioplegic cannulas were placed both antegrade and retrograde cold blood cardioplegia, and the patient was cooled to 32 degrees.  The aortic crossclamp was  applied, and 1 L of cold blood cardioplegia was delivered in split doses between the antegrade aortic and retrograde  coronary sinus catheters.  There was good color cardioplegic arrest and supple temperature dropped less than 12 degrees.  Cardioplegia  was redosed every 20 minutes.  The  distal coronary anastomoses were performed.  The first distal anastomosis was to the diagonal branch of LAD.  This was a 1.4 mm vessel with proximal 90% stenosis.  Reverse saphenous vein was sewn end-to-side with running 7-0 Prolene with good flow  through the graft.  Cardioplegia was redosed.  The second distal anastomosis was the distal third LAD.  There was a proximal ostial 90% stenosis.  The left IMA pedicle was brought through an opening in the left lateral pericardium and was brought down onto the LAD and sewn end-to-side with running  8-0 Prolene.  There was good flow through the anastomosis after briefly releasing the pedicle bulldog on the mammary artery.  The bulldog was reapplied and the pedicle secured to the epicardium with 6-0 Prolenes.  Cardioplegia was redosed.  While the cross clamp was still in place, a single vein proximal anastomosis was performed using a 4.5 mm punch and running 6-0 Prolene.  Prior to tying down the final proximal anastomosis, air was vented from the coronaries with a dose of retrograde  warm blood cardioplegia.  The crossclamp was removed.  The heart resumed a spontaneous rhythm.  The vein graft was deaired and opened and each anastomosis was hemostatic.  The patient was rewarmed and reperfused.  Temporary pacing wires were applied.  The lungs reexpanded.  Ventilator was resumed.  The  patient was weaned from cardiopulmonary bypass without difficulty.  Echo showed preserved LV systolic function.  Hemodynamics were stable.  Protamine was administered without adverse reaction.  The cannulas were removed.  The mediastinum was irrigated.   The superior pericardial fat was closed over the aorta and vein grafts.  The anterior mediastinum and left pleural chest tubes were placed and brought out through separate incisions.  The sternum was reapproximated with a wire and the patient remained stable.  The pectoralis fascia, subcutaneous layer and skin were closed with a  running Vicryl.  The patient returned to the ICU in stable condition.  TN/NUANCE  D:04/08/2019 T:04/08/2019 JOB:[id number]

## 2019-04-08 NOTE — Progress Notes (Signed)
Pre Procedure note for inpatients:   [name] has been scheduled for Procedure(s): CORONARY ARTERY BYPASS GRAFTING (CABG) (N/A) TRANSESOPHAGEAL ECHOCARDIOGRAM (TEE) (N/A) today. The various methods of treatment have been discussed with the patient. After consideration of the risks, benefits and treatment options the patient has consented to the planned procedure.   The patient has been seen and labs reviewed. There are no changes in the patient's condition to prevent proceeding with the planned procedure today.  Recent labs:  Lab Results  Component Value Date   WBC 11.0 (H) 04/04/2019   HGB 12.0 04/04/2019   HCT 37.0 04/04/2019   PLT 336 04/04/2019   GLUCOSE 93 04/04/2019   CHOL 80 (L) 02/06/2019   TRIG 75 02/06/2019   HDL 33 (L) 02/06/2019   LDLCALC 32 02/06/2019   ALT 17 04/04/2019   AST 16 04/04/2019   NA 138 04/04/2019   K 4.0 04/04/2019   CL 107 04/04/2019   CREATININE 0.77 04/04/2019   BUN 13 04/04/2019   CO2 22 04/04/2019   TSH 4.499 12/16/2018   INR 1.1 04/04/2019   HGBA1C 5.4 04/04/2019    Len Childs, MD 04/08/2019 7:37 AM

## 2019-04-09 ENCOUNTER — Encounter (HOSPITAL_COMMUNITY): Payer: Self-pay | Admitting: Cardiothoracic Surgery

## 2019-04-09 ENCOUNTER — Inpatient Hospital Stay (HOSPITAL_COMMUNITY): Payer: 59

## 2019-04-09 ENCOUNTER — Telehealth: Payer: 59 | Admitting: Physician Assistant

## 2019-04-09 LAB — CBC
HCT: 26.1 % — ABNORMAL LOW (ref 36.0–46.0)
HCT: 27.1 % — ABNORMAL LOW (ref 36.0–46.0)
Hemoglobin: 8.6 g/dL — ABNORMAL LOW (ref 12.0–15.0)
Hemoglobin: 8.8 g/dL — ABNORMAL LOW (ref 12.0–15.0)
MCH: 30.8 pg (ref 26.0–34.0)
MCH: 30.9 pg (ref 26.0–34.0)
MCHC: 33 g/dL (ref 30.0–36.0)
MCHC: 32.5 g/dL (ref 30.0–36.0)
MCV: 93.5 fL (ref 80.0–100.0)
MCV: 95.1 fL (ref 80.0–100.0)
Platelets: 188 10*3/uL (ref 150–400)
Platelets: 200 10*3/uL (ref 150–400)
RBC: 2.79 MIL/uL — ABNORMAL LOW (ref 3.87–5.11)
RBC: 2.85 MIL/uL — ABNORMAL LOW (ref 3.87–5.11)
RDW: 14.7 % (ref 11.5–15.5)
RDW: 14.9 % (ref 11.5–15.5)
WBC: 14.9 10*3/uL — ABNORMAL HIGH (ref 4.0–10.5)
WBC: 16.9 10*3/uL — ABNORMAL HIGH (ref 4.0–10.5)
nRBC: 0 % (ref 0.0–0.2)
nRBC: 0 % (ref 0.0–0.2)

## 2019-04-09 LAB — GLUCOSE, CAPILLARY
Glucose-Capillary: 102 mg/dL — ABNORMAL HIGH (ref 70–99)
Glucose-Capillary: 104 mg/dL — ABNORMAL HIGH (ref 70–99)
Glucose-Capillary: 109 mg/dL — ABNORMAL HIGH (ref 70–99)
Glucose-Capillary: 111 mg/dL — ABNORMAL HIGH (ref 70–99)
Glucose-Capillary: 111 mg/dL — ABNORMAL HIGH (ref 70–99)
Glucose-Capillary: 115 mg/dL — ABNORMAL HIGH (ref 70–99)
Glucose-Capillary: 115 mg/dL — ABNORMAL HIGH (ref 70–99)
Glucose-Capillary: 115 mg/dL — ABNORMAL HIGH (ref 70–99)
Glucose-Capillary: 116 mg/dL — ABNORMAL HIGH (ref 70–99)
Glucose-Capillary: 127 mg/dL — ABNORMAL HIGH (ref 70–99)
Glucose-Capillary: 137 mg/dL — ABNORMAL HIGH (ref 70–99)
Glucose-Capillary: 94 mg/dL (ref 70–99)

## 2019-04-09 LAB — BASIC METABOLIC PANEL
Anion gap: 7 (ref 5–15)
Anion gap: 4 — ABNORMAL LOW (ref 5–15)
BUN: 8 mg/dL (ref 6–20)
BUN: 10 mg/dL (ref 6–20)
CO2: 21 mmol/L — ABNORMAL LOW (ref 22–32)
CO2: 25 mmol/L (ref 22–32)
Calcium: 7.1 mg/dL — ABNORMAL LOW (ref 8.9–10.3)
Calcium: 7.3 mg/dL — ABNORMAL LOW (ref 8.9–10.3)
Chloride: 110 mmol/L (ref 98–111)
Chloride: 109 mmol/L (ref 98–111)
Creatinine, Ser: 0.68 mg/dL (ref 0.44–1.00)
Creatinine, Ser: 0.84 mg/dL (ref 0.44–1.00)
GFR calc Af Amer: >60 mL/min (ref >60)
GFR calc Af Amer: >60 mL/min (ref >60)
GFR calc non Af Amer: >60 mL/min (ref >60)
GFR calc non Af Amer: >60 mL/min (ref >60)
Glucose, Bld: 109 mg/dL — ABNORMAL HIGH (ref 70–99)
Glucose, Bld: 124 mg/dL — ABNORMAL HIGH (ref 70–99)
Potassium: 4.1 mmol/L (ref 3.5–5.1)
Potassium: 3.6 mmol/L (ref 3.5–5.1)
Sodium: 138 mmol/L (ref 135–145)
Sodium: 138 mmol/L (ref 135–145)

## 2019-04-09 LAB — MAGNESIUM
Magnesium: 3 mg/dL — ABNORMAL HIGH (ref 1.7–2.4)
Magnesium: 3 mg/dL — ABNORMAL HIGH (ref 1.7–2.4)

## 2019-04-09 MED ORDER — CHLORHEXIDINE GLUCONATE CLOTH 2 % EX PADS
6.0000 | MEDICATED_PAD | Freq: Every day | CUTANEOUS | Status: DC
  Administered 2019-04-09 – 2019-04-11 (×3): 6 via TOPICAL

## 2019-04-09 MED ORDER — ENOXAPARIN SODIUM 40 MG/0.4ML ~~LOC~~ SOLN: 40.0000 mg | Freq: Every day | SUBCUTANEOUS | Status: DC

## 2019-04-09 MED ORDER — FENTANYL CITRATE (PF) 100 MCG/2ML IJ SOLN
50.0000 ug | INTRAMUSCULAR | Status: DC | PRN
  Administered 2019-04-09: 50 ug via INTRAVENOUS
  Filled 2019-04-09: qty 2

## 2019-04-09 MED ORDER — KETOROLAC TROMETHAMINE 15 MG/ML IJ SOLN
15.0000 mg | Freq: Four times a day (QID) | INTRAMUSCULAR | Status: AC
  Administered 2019-04-09 (×4): 15 mg via INTRAVENOUS
  Filled 2019-04-09 (×4): qty 1

## 2019-04-09 MED ORDER — POTASSIUM CHLORIDE CRYS ER 20 MEQ PO TBCR
20.0000 meq | EXTENDED_RELEASE_TABLET | ORAL | Status: AC
  Administered 2019-04-09 – 2019-04-10 (×3): 20 meq via ORAL
  Filled 2019-04-09 (×3): qty 1

## 2019-04-09 MED ORDER — ENOXAPARIN SODIUM 40 MG/0.4ML ~~LOC~~ SOLN
40.0000 mg | Freq: Every day | SUBCUTANEOUS | Status: DC
  Administered 2019-04-09: 40 mg via SUBCUTANEOUS
  Filled 2019-04-09: qty 0.4

## 2019-04-09 MED ORDER — INSULIN ASPART 100 UNIT/ML ~~LOC~~ SOLN: 0.0000 [IU] | SUBCUTANEOUS | Status: DC

## 2019-04-09 MED ORDER — FUROSEMIDE 10 MG/ML IJ SOLN
20.0000 mg | Freq: Two times a day (BID) | INTRAMUSCULAR | Status: DC
  Administered 2019-04-09 (×2): 20 mg via INTRAVENOUS
  Filled 2019-04-09 (×2): qty 2

## 2019-04-09 MED FILL — Sodium Bicarbonate IV Soln 8.4%: INTRAVENOUS | Qty: 50 | Status: AC

## 2019-04-09 MED FILL — Sodium Chloride IV Soln 0.9%: INTRAVENOUS | Qty: 2000 | Status: AC

## 2019-04-09 MED FILL — Potassium Chloride Inj 2 mEq/ML: INTRAVENOUS | Qty: 40 | Status: AC

## 2019-04-09 MED FILL — Electrolyte-R (PH 7.4) Solution: INTRAVENOUS | Qty: 4000 | Status: AC

## 2019-04-09 MED FILL — Lidocaine HCl Local Soln Prefilled Syringe 100 MG/5ML (2%): INTRAMUSCULAR | Qty: 5 | Status: AC

## 2019-04-09 MED FILL — Heparin Sodium (Porcine) Inj 1000 Unit/ML: INTRAMUSCULAR | Qty: 30 | Status: AC

## 2019-04-09 MED FILL — Mannitol IV Soln 20%: INTRAVENOUS | Qty: 500 | Status: AC

## 2019-04-09 MED FILL — Magnesium Sulfate Inj 50%: INTRAMUSCULAR | Qty: 10 | Status: AC

## 2019-04-09 NOTE — Anesthesia Postprocedure Evaluation (Signed)
Anesthesia Post Note  Patient: [name]  Procedure(s) Performed: CORONARY ARTERY BYPASS GRAFTING (CABG) x two , using left internal mammary artery and right leg greater saphenous vein harvested endoscopically (N/A Chest) TRANSESOPHAGEAL ECHOCARDIOGRAM (TEE) (N/A )     Patient location during evaluation: SICU Anesthesia Type: General Level of consciousness: awake and alert, patient cooperative and oriented Pain management: pain level controlled (pt c/o minor sore throat and chest soreness when she breathes) Vital Signs Assessment: post-procedure vital signs reviewed and stable Respiratory status: spontaneous breathing, nonlabored ventilation, respiratory function stable and patient connected to nasal cannula oxygen Cardiovascular status: blood pressure returned to baseline and stable Postop Assessment: no apparent nausea or vomiting and adequate PO intake Anesthetic complications: no    Last Vitals:  Vitals:   04/09/19 1000 04/09/19 1100  BP:    Pulse: 98   Resp: 15   Temp:  36.8 C  SpO2: 95%     Last Pain:  Vitals:   04/09/19 1100  TempSrc: Oral  PainSc:                  Robbin Loughmiller,E. Denzel Etienne

## 2019-04-09 NOTE — Discharge Summary (Addendum)
Physician Discharge Summary  [name] MRN: [id number] DOB/AGE: Mar 08, 1966 53 y.o.  Admit date: 04/08/2019 Discharge date: 04/13/2019  Admission Diagnoses:  Patient Active Problem List   Diagnosis Date Noted  . Morbid obesity ([place]) 12/18/2018  . Ischemic cardiomyopathy -in setting of anterior non-STEMI 12/17/2018  . Acute combined systolic and diastolic heart failure ([place]) 12/17/2018  . Acute non-ST elevation myocardial infarction (NSTEMI) ([place]) 12/16/2018  . CAD S/P DES PCI -pLAD 12/16/2018  . Hyperlipidemia with target LDL less than 70 12/16/2018  . Traumatic fracture of spine at T12-L1 level, sequela 03/27/2014  . Encounter for IUD removal 05/16/2013    Class: Status post  . S/P tubal ligation 05/16/2013    Class: Status post  . IRREGULAR MENSES 09/29/2009   Discharge Diagnoses:   Patient Active Problem List   Diagnosis Date Noted  . 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 Morbid obesity ([place]) 12/18/2018  . Ischemic cardiomyopathy -in setting of anterior non-STEMI 12/17/2018  . Acute combined systolic and diastolic heart failure ([place]) 12/17/2018  . Acute non-ST elevation myocardial infarction (NSTEMI) ([place]) 12/16/2018  . CAD S/P DES PCI -pLAD 12/16/2018  . Hyperlipidemia with target LDL less than 70 12/16/2018  . Traumatic fracture of spine at T12-L1 level, sequela 03/27/2014  . Encounter for IUD removal 05/16/2013    Class: Status post  . S/P tubal ligation 05/16/2013    Class: Status post  . IRREGULAR MENSES 09/29/2009   Discharged Condition: good  History of Present Illness:  Mrs. [name] is a 53 yo obese white female who suffered an acute anterior wall MI in March 2020.  Catheterization at that time revealed a 99% LAD stenosis that was treated with PCI.  There was some residual ostial LAD stenosis with improvement of LV function.  She was treated with Brilinta.  The patient initially did well.  However recently she again developed chest pressure with ambulation  [place] in [place] neighborhood.  Repeat catheterization was performed with showed progression of her ostial LAD disease.  Her stent was patent.  There was also a mal-perfused diagonal artery due to the previous stent placement.  It was felt she would require coronary bypass grafting and she was referred to [facility] for evaluation.  She was evaluated by Dr. Prescott Gum at which time she again expressed issues with chest pressure with walking up inclines.  It was felt the patient would benefit from coronary bypass grafting.  The risks and benefits of the procedure were explained to the patient and she was agreeable to proceed.  Hospital Course:   Mrs. [name] presented to [facility] on 04/08[facility].  She was taken to the operating room and underwent CABG x 2 utilizing LIMA to LAD and SVG to Diagonal.  She also underwent endoscopic harvest of greater saphenous vein from his right thigh.  She tolerated the procedure without difficulty and was taken to the SICU in stable condition.  The patient was extubated the evening of surgery.  During her stay in the SICU she require Neo-synephrine for hypotension.  This was weaned as tolerated.  His chest tubes, arterial line, and swan ganz catheter were removed without difficulty.  Her hemoglobin decreased to 7.8, she remained hypotensive and was transfused 1 unit of packed blood cells.  She had issues with nausea which was managed with antiemetics.  She has maintained NSR.   Her pacing wires were removed without difficulty.  She is ambulating independently.  Her incisions are healing without evidence of infection.  She is medically stable for discharge home today.  Significant Diagnostic Studies: angiography:    Ostial LAD, 85%, proximal stent widely patent. LAD mid 40%. First diagonal jailed and 90%.  Normal LM, Circumflex, and RCA.  Normal LV function with EF 50%. LVEDP is normal.  Treatments: surgery:   1.  Coronary artery bypass grafting x2 (left internal mammary  artery to left anterior descending, saphenous vein graft to first diagonal). 2.  Endoscopic harvest of right leg greater saphenous vein.  Discharge Exam: Blood pressure 111/75, pulse 89, temperature 98.3 F (36.8 C), temperature source Oral, resp. rate 17, weight 83.6 kg, SpO2 97 %.  General appearance:alert, cooperative and no distress Heart:regular rate and rhythm Lungs:clear to auscultation bilaterally Abdomen:soft, non-tender; bowel sounds normal; no masses, no organomegaly Extremities:edematrace Wound:clean and dry   Disposition: Discharge disposition: 01-Home or Self Care      Discharged to home in stable condition  Discharge Medications:  The patient has been discharged on:   1.Beta Blocker:  Yes [ X  ]                              No   [   ]                              If No, reason:  2.Ace Inhibitor/ARB: Yes [x  ]                                     No  [    ]                                     If No, reason: labile BP  3.Statin:   Yes [ X  ]                  No  [   ]                  If No, reason:  4.Ecasa:  Yes  Valu.Nieves ]                  No   [   ]                  If No, reason:    Discharge Instructions    Amb Referral to Cardiac Rehabilitation   Complete by: As directed    [email or web address]   Diagnosis: CABG   CABG X ___: 2   After initial evaluation and assessments completed: Virtual Based Care may be provided alone or in conjunction with Phase 2 Cardiac Rehab based on patient barriers.: Yes     Allergies as of 04/13/2019      Reactions   Tetracycline Rash      Medication List    STOP taking these medications   carvedilol 12.5 MG tablet Commonly known as: COREG   losartan 25 MG tablet Commonly known as: COZAAR   ticagrelor 90 MG Tabs tablet Commonly known as: BRILINTA     TAKE these medications   acetaminophen 325 MG tablet Commonly known as: TYLENOL Take 650 mg by mouth every 6 (six)  hours as needed.   aspirin 81 MG chewable tablet  Chew 1 tablet (81 mg total) by mouth daily.   atorvastatin 80 MG tablet Commonly known as: LIPITOR Take 1 tablet (80 mg total) by mouth daily at 6 PM.   loratadine 10 MG tablet Commonly known as: CLARITIN Take 10 mg by mouth daily as needed for allergies.   metoprolol tartrate 25 MG tablet Commonly known as: LOPRESSOR Take 0.5 tablets (12.5 mg total) by mouth 2 (two) times daily for 30 days.   nitroGLYCERIN 0.4 MG SL tablet Commonly known as: NITROSTAT Place 1 tablet (0.4 mg total) under the tongue every 5 (five) minutes as needed for chest pain.   oxyCODONE-acetaminophen 5-325 MG tablet Commonly known as: Percocet Take 1 tablet by mouth every 4 (four) hours as needed for up to 7 days for severe pain.      Follow-up Information    Ivin Poot, MD Follow up on 05/08/2019.   Specialty: Cardiothoracic Surgery Why: Appointment is at 11:30, please get CXR at 11:00 at [facility] located on first floor of our office building Contact information: [place] [address] [phone]        Burtis Junes, NP Follow up on 04/29/2019.   Specialties: Nurse Practitioner, Interventional Cardiology, Cardiology, Radiology Why: Appointment is at 11:30 Contact information: [place]. [address] [place] [address] [phone]           Signed: Antony Odea PA-C [phone] 04/13/2019, 9:58 AM   patient examined and medical record reviewed,agree with above note. Tharon Aquas Trigt III 04/23/2019

## 2019-04-09 NOTE — Progress Notes (Addendum)
[facility] DAILY ICU PROGRESS NOTE                   [address]            [place] [address]          [phone]   1 Day Post-Op Procedure(s) (LRB): CORONARY ARTERY BYPASS GRAFTING (CABG) x two , using left internal mammary artery and right leg greater saphenous vein harvested endoscopically (N/A) TRANSESOPHAGEAL ECHOCARDIOGRAM (TEE) (N/A)  Total Length of Stay:  LOS: 1 day   Subjective:  Patient complains of soreness. Pain medication provides relief.  She denies N/V...  However she states that she has not tried to eat anything yet.  Objective: Vital signs in last 24 hours: Temp:  [95 F (35 C)-99.1 F (37.3 C)] 98.8 F (37.1 C) (06/23 0700) Pulse Rate:  [75-109] 95 (06/23 0700) Cardiac Rhythm: Normal sinus rhythm (06/23 0400) Resp:  [12-41] 17 (06/23 0700) BP: (89-120)/(60-96) 89/60 (06/23 0700) SpO2:  [92 %-100 %] 95 % (06/23 0700) Arterial Line BP: (85-139)/(50-93) 85/50 (06/23 0700) FiO2 (%):  [40 %-50 %] 40 % (06/22 1546) Weight:  [86.4 kg] 86.4 kg (06/23 0600)  Filed Weights   04/09/19 0600  Weight: 86.4 kg    Weight change:    Hemodynamic parameters for last 24 hours: PAP: (13-30)/(6-18) 30/12 CO:  [2.5 L/min-4.8 L/min] 4.8 L/min CI:  [1.4 L/min/m2-2.7 L/min/m2] 2.7 L/min/m2  Intake/Output from previous day: 06/22 0701 - 06/23 0700 In: 4819.1 [I.V.:2695.8; Blood:300; IV Piggyback:1823.3] Out: 4705 [Urine:3605; Blood:650; Chest Tube:450]  Current Meds: Scheduled Meds:  acetaminophen  1,000 mg Oral Q6H   Or   acetaminophen (TYLENOL) oral liquid 160 mg/5 mL  1,000 mg Per Tube Q6H   aspirin EC  325 mg Oral Daily   Or   aspirin  324 mg Per Tube Daily   atorvastatin  80 mg Oral q1800   bisacodyl  10 mg Oral Daily   Or   bisacodyl  10 mg Rectal Daily   docusate sodium  200 mg Oral Daily   enoxaparin (LOVENOX) injection  40 mg Subcutaneous QHS   enoxaparin (LOVENOX) injection  40 mg Subcutaneous QHS   furosemide  20 mg  Intravenous BID   insulin aspart  0-24 Units Subcutaneous Q4H   insulin aspart  0-24 Units Subcutaneous Q4H   metoprolol tartrate  12.5 mg Oral BID   Or   metoprolol tartrate  12.5 mg Per Tube BID   [START ON 04/10/2019] pantoprazole  40 mg Oral Daily   sodium chloride flush  3 mL Intravenous Q12H   Continuous Infusions:  sodium chloride 10 mL/hr at 04/09/19 0700   sodium chloride     sodium chloride 10 mL/hr at 04/08/19 1403   albumin human 12.5 g (04/08/19 1737)   cefUROXime (ZINACEF)  IV Stopped (04/08/19 2236)   lactated ringers     lactated ringers 20 mL/hr at 04/09/19 0700   nitroGLYCERIN Stopped (04/08/19 1624)   phenylephrine (NEO-SYNEPHRINE) Adult infusion 10 mcg/min (04/09/19 0700)   PRN Meds:.sodium chloride, albumin human, hydrALAZINE, loratadine, metoprolol tartrate, midazolam, morphine injection, ondansetron (ZOFRAN) IV, oxyCODONE, sodium chloride flush, traMADol  General appearance: alert, cooperative and no distress Heart: regular rate and rhythm Lungs: clear to auscultation bilaterally Abdomen: soft, non-tender; bowel sounds normal; no masses,  no organomegaly Extremities: edema trace Wound: clean and dry EVH site, aquacel in place  Lab Results: CBC: Recent Labs    04/08/19 1846 04/08/19 1853 04/09/19 0419  WBC  21.3*  --  14.9*  HGB 9.2* 8.8* 8.6*  HCT 28.1* 26.0* 26.1*  PLT 213  --  188   BMET:  Recent Labs    04/08/19 1846 04/08/19 1853 04/09/19 0419  NA  --  142 138  K  --  4.4 4.1  CL  --   --  110  CO2  --   --  21*  GLUCOSE  --  124* 109*  BUN  --   --  8  CREATININE 0.68  --  0.68  CALCIUM  --   --  7.1*    CMET: Lab Results  Component Value Date   WBC 14.9 (H) 04/09/2019   HGB 8.6 (L) 04/09/2019   HCT 26.1 (L) 04/09/2019   PLT 188 04/09/2019   GLUCOSE 109 (H) 04/09/2019   CHOL 80 (L) 02/06/2019   TRIG 75 02/06/2019   HDL 33 (L) 02/06/2019   LDLCALC 32 02/06/2019   ALT 17 04/04/2019   AST 16 04/04/2019   NA  138 04/09/2019   K 4.1 04/09/2019   CL 110 04/09/2019   CREATININE 0.68 04/09/2019   BUN 8 04/09/2019   CO2 21 (L) 04/09/2019   TSH 4.499 12/16/2018   INR 1.6 (H) 04/08/2019   HGBA1C 5.4 04/04/2019      PT/INR:  Recent Labs    04/08/19 1320  LABPROT 18.5*  INR 1.6*   Radiology: Dg Chest Port 1 View  Result Date: 04/08/2019 CLINICAL DATA:  CABG. EXAM: PORTABLE CHEST 1 VIEW COMPARISON:  04/04/2019. FINDINGS: Endotracheal tube, NG tube, Swan-Ganz catheter, mediastinal drainage catheter, left chest tube in good anatomic position. No pneumothorax. Prior CABG. Heart size normal. Low lung volumes. Tiny left pleural effusion cannot be excluded. IMPRESSION: 1. Lines and tubes including left chest tube in good anatomic position. No pneumothorax. 2.  Tiny left pleural effusion cannot be excluded. Electronically Signed   By: Marcello Moores  Register   On: 04/08/2019 13:47     Assessment/Plan: S/P Procedure(s) (LRB): CORONARY ARTERY BYPASS GRAFTING (CABG) x two , using left internal mammary artery and right leg greater saphenous vein harvested endoscopically (N/A) TRANSESOPHAGEAL ECHOCARDIOGRAM (TEE) (N/A)  1. CV- post op EKG shows NSR, currently in NSR, BP is labile.. on Neo-synephrine, wean as BP allows 2. Pulm- no acute issues, CXR with basilar atelectasis, CT output 450 cc since surgery, will d/c chest tubes today 3. Renal- creatinine stable, weight is elevated, hold off on Lasix for now due to labile BP, can start if needed once off Neo 4. Expected post operative blood loss anemia, mild Hgb at 8.6 5. CBGs- controlled, not on insulin drip, will continue SSIP 6. Dispo- patient stable, expected post operative soreness, d/c chest tubes, wean Neo as able, POD #1 progression orders    Ellwood Handler 04/09/2019 7:35 AM   Cardiac status stable, CXR clear Mobilize and diurese today patient examined and medical record reviewed,agree with above note. Tharon Aquas Trigt III 04/09/2019

## 2019-04-09 NOTE — Progress Notes (Signed)
      [address]       [place] [address]             [phone]      POD # 1 CABG x 2 Resting comfortably BP 96/62   Pulse 80   Temp 98.9 F (37.2 C) (Oral)   Resp 19   Wt 86.4 kg   SpO2 96%   BMI 38.47 kg/m   Intake/Output Summary (Last 24 hours) at 04/09/2019 1827 Last data filed at 04/09/2019 1600 Gross per 24 hour  Intake 995.41 ml  Output 1825 ml  Net -829.59 ml   Hct= 27 K= 3.6- supplement  Remo Lipps C. Roxan Hockey, MD [facility] [phone]

## 2019-04-10 ENCOUNTER — Inpatient Hospital Stay (HOSPITAL_COMMUNITY): Payer: 59

## 2019-04-10 LAB — BASIC METABOLIC PANEL
Anion gap: 6 (ref 5–15)
BUN: 10 mg/dL (ref 6–20)
CO2: 24 mmol/L (ref 22–32)
Calcium: 7.4 mg/dL — ABNORMAL LOW (ref 8.9–10.3)
Chloride: 106 mmol/L (ref 98–111)
Creatinine, Ser: 0.79 mg/dL (ref 0.44–1.00)
GFR calc Af Amer: >60 mL/min (ref >60)
GFR calc non Af Amer: >60 mL/min (ref >60)
Glucose, Bld: 107 mg/dL — ABNORMAL HIGH (ref 70–99)
Potassium: 3.8 mmol/L (ref 3.5–5.1)
Sodium: 136 mmol/L (ref 135–145)

## 2019-04-10 LAB — CBC
HCT: 24.5 % — ABNORMAL LOW (ref 36.0–46.0)
Hemoglobin: 7.8 g/dL — ABNORMAL LOW (ref 12.0–15.0)
MCH: 30.7 pg (ref 26.0–34.0)
MCHC: 31.8 g/dL (ref 30.0–36.0)
MCV: 96.5 fL (ref 80.0–100.0)
Platelets: 172 K/uL (ref 150–400)
RBC: 2.54 MIL/uL — ABNORMAL LOW (ref 3.87–5.11)
RDW: 15.1 % (ref 11.5–15.5)
WBC: 16 K/uL — ABNORMAL HIGH (ref 4.0–10.5)
nRBC: 0 % (ref 0.0–0.2)

## 2019-04-10 LAB — GLUCOSE, CAPILLARY
Glucose-Capillary: 104 mg/dL — ABNORMAL HIGH (ref 70–99)
Glucose-Capillary: 107 mg/dL — ABNORMAL HIGH (ref 70–99)
Glucose-Capillary: 110 mg/dL — ABNORMAL HIGH (ref 70–99)
Glucose-Capillary: 128 mg/dL — ABNORMAL HIGH (ref 70–99)

## 2019-04-10 LAB — PREPARE RBC (CROSSMATCH)

## 2019-04-10 MED ORDER — FUROSEMIDE 40 MG PO TABS: 40.0000 mg | ORAL_TABLET | Freq: Every day | ORAL | Status: DC

## 2019-04-10 MED ORDER — FUROSEMIDE 10 MG/ML IJ SOLN: 20.0000 mg | Freq: Once | INTRAMUSCULAR | Status: DC

## 2019-04-10 MED ORDER — POTASSIUM CHLORIDE CRYS ER 20 MEQ PO TBCR
20.0000 meq | EXTENDED_RELEASE_TABLET | Freq: Every day | ORAL | Status: DC
  Administered 2019-04-10 – 2019-04-11 (×2): 20 meq via ORAL
  Filled 2019-04-10 (×2): qty 1

## 2019-04-10 MED ORDER — METOCLOPRAMIDE HCL 5 MG/ML IJ SOLN
10.0000 mg | Freq: Four times a day (QID) | INTRAMUSCULAR | Status: DC
  Administered 2019-04-10 – 2019-04-11 (×5): 10 mg via INTRAVENOUS
  Filled 2019-04-10 (×5): qty 2

## 2019-04-10 MED ORDER — FUROSEMIDE 10 MG/ML IJ SOLN
40.0000 mg | Freq: Every day | INTRAMUSCULAR | Status: DC
  Administered 2019-04-10: 40 mg via INTRAVENOUS
  Filled 2019-04-10: qty 4

## 2019-04-10 NOTE — Progress Notes (Addendum)
EVENING ROUNDS NOTE :     [place]       ,[place] [address]             [phone]                 2 Days Post-Op Procedure(s) (LRB): CORONARY ARTERY BYPASS GRAFTING (CABG) x two , using left internal mammary artery and right leg greater saphenous vein harvested endoscopically (N/A) TRANSESOPHAGEAL ECHOCARDIOGRAM (TEE) (N/A)  Total Length of Stay:  LOS: 2 days  BP 109/81   Pulse 100   Temp 98.8 F (37.1 C) (Oral)   Resp 18   Wt 86.6 kg   SpO2 96%   BMI 38.56 kg/m   .Intake/Output      06/24 0701 - 06/25 0700   P.O.    I.V. (mL/kg) 262.4 (3)   Blood 315   IV Piggyback    Total Intake(mL/kg) 577.4 (6.7)   Urine (mL/kg/hr) 950 (0.9)   Chest Tube    Total Output 950   Net -372.7         . sodium chloride 20 mL/hr at 04/10/19 1800  . sodium chloride 250 mL (04/10/19 0850)  . sodium chloride 10 mL/hr at 04/08/19 1403  . lactated ringers    . lactated ringers Stopped (04/10/19 0909)  . phenylephrine (NEO-SYNEPHRINE) Adult infusion Stopped (04/09/19 0818)     Lab Results  Component Value Date   WBC 16.0 (H) 04/10/2019   HGB 7.8 (L) 04/10/2019   HCT 24.5 (L) 04/10/2019   PLT 172 04/10/2019   GLUCOSE 107 (H) 04/10/2019   CHOL 80 (L) 02/06/2019   TRIG 75 02/06/2019   HDL 33 (L) 02/06/2019   LDLCALC 32 02/06/2019   ALT 17 04/04/2019   AST 16 04/04/2019   NA 136 04/10/2019   K 3.8 04/10/2019   CL 106 04/10/2019   CREATININE 0.79 04/10/2019   BUN 10 04/10/2019   CO2 24 04/10/2019   TSH 4.499 12/16/2018   INR 1.6 (H) 04/08/2019   HGBA1C 5.4 04/04/2019    No new complaints Feels better after blood transfusion Nausea, persists but has improved, she has tolerated some oral intake today with solid food Maintaining NSR  Ellwood Handler, PA-C    Office [phone] 04/10/2019 7:17 PM   I have seen and examined the patient and agree with the assessment and plan as outlined.  Rexene Alberts, MD 04/10/2019 7:28 PM

## 2019-04-10 NOTE — Progress Notes (Addendum)
[facility] DAILY ICU PROGRESS NOTE                   [address]            ,[place] [address]          [phone]   2 Days Post-Op Procedure(s) (LRB): CORONARY ARTERY BYPASS GRAFTING (CABG) x two , using left internal mammary artery and right leg greater saphenous vein harvested endoscopically (N/A) TRANSESOPHAGEAL ECHOCARDIOGRAM (TEE) (N/A)  Total Length of Stay:  LOS: 2 days   Subjective:  Patient is doing okay this morning.  Her pain is under better control.  She complains of persistent nausea, some relief with zofran.  No Bm + gas...+ ambulation  Objective: Vital signs in last 24 hours: Temp:  [98.2 F (36.8 C)-98.9 F (37.2 C)] 98.3 F (36.8 C) (06/24 0400) Pulse Rate:  [74-103] 103 (06/24 0600) Cardiac Rhythm: Normal sinus rhythm (06/24 0400) Resp:  [13-19] 18 (06/24 0400) BP: (81-100)/(59-75) 87/71 (06/24 0600) SpO2:  [90 %-99 %] 93 % (06/24 0600) Arterial Line BP: (90)/(51) 90/51 (06/23 0800) Weight:  [86.6 kg] 86.6 kg (06/24 0600)  Filed Weights   04/09/19 0600 04/10/19 0600  Weight: 86.4 kg 86.6 kg    Weight change: 0.2 kg   Hemodynamic parameters for last 24 hours: PAP: (30)/(11) 30/11  Intake/Output from previous day: 06/23 0701 - 06/24 0700 In: 1169.8 [P.O.:360; I.V.:609.9; IV Piggyback:199.9] Out: 1365 [Urine:1315; Chest Tube:50]  Current Meds: Scheduled Meds: . acetaminophen  1,000 mg Oral Q6H   Or  . acetaminophen (TYLENOL) oral liquid 160 mg/5 mL  1,000 mg Per Tube Q6H  . aspirin EC  325 mg Oral Daily   Or  . aspirin  324 mg Per Tube Daily  . atorvastatin  80 mg Oral q1800  . bisacodyl  10 mg Oral Daily   Or  . bisacodyl  10 mg Rectal Daily  . Chlorhexidine Gluconate Cloth  6 each Topical Daily  . docusate sodium  200 mg Oral Daily  . furosemide  20 mg Intravenous BID  . metoprolol tartrate  12.5 mg Oral BID   Or  . metoprolol tartrate  12.5 mg Per Tube BID  . pantoprazole  40 mg Oral Daily  . sodium chloride flush  3 mL  Intravenous Q12H   Continuous Infusions: . sodium chloride Stopped (04/10/19 0543)  . sodium chloride    . sodium chloride 10 mL/hr at 04/08/19 1403  . cefUROXime (ZINACEF)  IV Stopped (04/09/19 2006)  . lactated ringers    . lactated ringers Stopped (04/10/19 0543)  . nitroGLYCERIN Stopped (04/08/19 1624)  . phenylephrine (NEO-SYNEPHRINE) Adult infusion Stopped (04/09/19 0818)   PRN Meds:.sodium chloride, fentaNYL (SUBLIMAZE) injection, loratadine, metoprolol tartrate, midazolam, ondansetron (ZOFRAN) IV, oxyCODONE, sodium chloride flush, traMADol  General appearance: alert, cooperative and no distress Heart: regular rate and rhythm Lungs: clear to auscultation bilaterally Abdomen: soft, non-tender; bowel sounds normal; no masses,  no organomegaly Extremities: edema trace Wound: clean and dry EVH site, aquacel dressing in place  Lab Results: CBC: Recent Labs    04/09/19 1731 04/10/19 0430  WBC 16.9* 16.0*  HGB 8.8* 7.8*  HCT 27.1* 24.5*  PLT 200 172   BMET:  Recent Labs    04/09/19 1731 04/10/19 0430  NA 138 136  K 3.6 3.8  CL 109 106  CO2 25 24  GLUCOSE 124* 107*  BUN 10 10  CREATININE 0.84 0.79  CALCIUM 7.3* 7.4*    CMET:  Lab Results  Component Value Date   WBC 16.0 (H) 04/10/2019   HGB 7.8 (L) 04/10/2019   HCT 24.5 (L) 04/10/2019   PLT 172 04/10/2019   GLUCOSE 107 (H) 04/10/2019   CHOL 80 (L) 02/06/2019   TRIG 75 02/06/2019   HDL 33 (L) 02/06/2019   LDLCALC 32 02/06/2019   ALT 17 04/04/2019   AST 16 04/04/2019   NA 136 04/10/2019   K 3.8 04/10/2019   CL 106 04/10/2019   CREATININE 0.79 04/10/2019   BUN 10 04/10/2019   CO2 24 04/10/2019   TSH 4.499 12/16/2018   INR 1.6 (H) 04/08/2019   HGBA1C 5.4 04/04/2019      PT/INR:  Recent Labs    04/08/19 1320  LABPROT 18.5*  INR 1.6*   Radiology: No results found.   Assessment/Plan: S/P Procedure(s) (LRB): CORONARY ARTERY BYPASS GRAFTING (CABG) x two , using left internal mammary artery and  right leg greater saphenous vein harvested endoscopically (N/A) TRANSESOPHAGEAL ECHOCARDIOGRAM (TEE) (N/A)  1. CV- maintaining NSR, BP is labile in the 80-90s- not currently on Neo-synephrine 2. Pulm- wean oxygen as tolerated, CXR with left apical pneumothorax, bibasilar atelectasis 3. Renal- creatinine WNL, weight is stable, U/O is sluggish, diuresed well with lasix yesterday, can start oral Lasix 40 mg daily 4. Expected post operative blood loss anemia, moderate Hgb 7.8.Marland KitchenMarland Kitchen would benefit from 1 unit of blood if okay with Dr. Prescott Gum with decreased U/O and labile BP 5. CBGS controlled- will stop SSIP 6. Dispo- patient stable, nausea persists despite zofran will restart reglan, maintaining NSR, BP is labile, Hgb is down to 7.8. would benefit from transfusion will discuss with Dr. Prescott Gum, leave in ICU today for accurate I/O with foley in place, work on nausea, relief    Ellwood Handler PA-C [phone]   04/10/2019 7:30 AM   1 unit PRBC for symptomatic expected postop  anemia Daily IV lasix DC foley  patient examined and medical record reviewed,agree with above note. Tharon Aquas Trigt III 04/10/2019

## 2019-04-10 NOTE — Consult Note (Signed)
   [facility] Inpatient Consult   04/10/2019  [name] 28-Aug-1966 [id number]   Patient is currently active with [facility] for chronic disease management services.  Patient has been engaged by a [facility] Management Coordinator.   Patient will have follow up with [facility] Management Coordinator.  Of note, [facility] Care Management services does not replace or interfere with any services that are needed or arranged by inpatient case management or social work.  For additional questions or needs please contact:   Natividad Brood, RN BSN [facility] Liaison  [phone] business mobile phone Toll free office [phone]  Fax number: [phone] [email or web address]

## 2019-04-11 ENCOUNTER — Inpatient Hospital Stay (HOSPITAL_COMMUNITY): Payer: 59

## 2019-04-11 LAB — BPAM RBC
Blood Product Expiration Date: 202006262359
Blood Product Expiration Date: 202006262359
Blood Product Expiration Date: 202006292359
ISSUE DATE / TIME: 202006221033
ISSUE DATE / TIME: 202006232119
ISSUE DATE / TIME: 202006240842
Unit Type and Rh: 7300
Unit Type and Rh: 7300
Unit Type and Rh: 7300

## 2019-04-11 LAB — BASIC METABOLIC PANEL
Anion gap: 3 — ABNORMAL LOW (ref 5–15)
BUN: 6 mg/dL (ref 6–20)
CO2: 27 mmol/L (ref 22–32)
Calcium: 7.8 mg/dL — ABNORMAL LOW (ref 8.9–10.3)
Chloride: 106 mmol/L (ref 98–111)
Creatinine, Ser: 0.62 mg/dL (ref 0.44–1.00)
GFR calc Af Amer: >60 mL/min (ref >60)
GFR calc non Af Amer: >60 mL/min (ref >60)
Glucose, Bld: 111 mg/dL — ABNORMAL HIGH (ref 70–99)
Potassium: 3.8 mmol/L (ref 3.5–5.1)
Sodium: 136 mmol/L (ref 135–145)

## 2019-04-11 LAB — TYPE AND SCREEN
ABO/RH(D): B POS
Antibody Screen: NEGATIVE
Unit division: 0
Unit division: 0
Unit division: 0

## 2019-04-11 LAB — CBC
HCT: 29.9 % — ABNORMAL LOW (ref 36.0–46.0)
Hemoglobin: 9.7 g/dL — ABNORMAL LOW (ref 12.0–15.0)
MCH: 30.7 pg (ref 26.0–34.0)
MCHC: 32.4 g/dL (ref 30.0–36.0)
MCV: 94.6 fL (ref 80.0–100.0)
Platelets: 208 10*3/uL (ref 150–400)
RBC: 3.16 MIL/uL — ABNORMAL LOW (ref 3.87–5.11)
RDW: 14.7 % (ref 11.5–15.5)
WBC: 12.5 10*3/uL — ABNORMAL HIGH (ref 4.0–10.5)
nRBC: 0 % (ref 0.0–0.2)

## 2019-04-11 LAB — GLUCOSE, CAPILLARY
Glucose-Capillary: 105 mg/dL — ABNORMAL HIGH (ref 70–99)
Glucose-Capillary: 74 mg/dL (ref 70–99)
Glucose-Capillary: 82 mg/dL (ref 70–99)
Glucose-Capillary: 86 mg/dL (ref 70–99)
Glucose-Capillary: 89 mg/dL (ref 70–99)
Glucose-Capillary: 96 mg/dL (ref 70–99)

## 2019-04-11 MED ORDER — SODIUM CHLORIDE 0.9% FLUSH: 10.0000 mL | INTRAVENOUS | Status: DC | PRN

## 2019-04-11 MED ORDER — DM-GUAIFENESIN ER 30-600 MG PO TB12
1.0000 | ORAL_TABLET | Freq: Two times a day (BID) | ORAL | Status: DC | PRN
  Filled 2019-04-11: qty 1

## 2019-04-11 MED ORDER — FUROSEMIDE 40 MG PO TABS
40.0000 mg | ORAL_TABLET | Freq: Every day | ORAL | Status: DC
  Administered 2019-04-11 – 2019-04-12 (×2): 40 mg via ORAL
  Filled 2019-04-11 (×3): qty 1

## 2019-04-11 MED ORDER — SODIUM CHLORIDE 0.9% FLUSH
10.0000 mL | Freq: Two times a day (BID) | INTRAVENOUS | Status: DC
  Administered 2019-04-11 – 2019-04-12 (×3): 10 mL

## 2019-04-11 NOTE — Progress Notes (Signed)
Responded to PIV consult. Pt up in chair and unable to fully assess veins.

## 2019-04-11 NOTE — Progress Notes (Addendum)
      [address]       [place] [address]             [phone]      3 Days Post-Op Procedure(s) (LRB): CORONARY ARTERY BYPASS GRAFTING (CABG) x two , using left internal mammary artery and right leg greater saphenous vein harvested endoscopically (N/A) TRANSESOPHAGEAL ECHOCARDIOGRAM (TEE) (N/A)   Subjective:  No new complaints.  Nausea has improved some.  She states sometimes she feels really short of breath especially when she wakes from sleep.    Objective: Vital signs in last 24 hours: Temp:  [97.9 F (36.6 C)-98.8 F (37.1 C)] 97.9 F (36.6 C) (06/25 0400) Pulse Rate:  [75-100] 92 (06/25 0700) Cardiac Rhythm: Normal sinus rhythm (06/25 0400) Resp:  [18-20] 18 (06/24 1025) BP: (86-112)/(54-81) 100/75 (06/25 0700) SpO2:  [90 %-100 %] 94 % (06/25 0700) Weight:  [86.5 kg] 86.5 kg (06/25 0500)  Intake/Output from previous day: 06/24 0701 - 06/25 0700 In: 814 [I.V.:499; Blood:315] Out: 950 [Urine:950]  General appearance: alert, cooperative and no distress Heart: regular rate and rhythm Lungs: clear to auscultation bilaterally Abdomen: soft, non-tender; bowel sounds normal; no masses,  no organomegaly Extremities: edema trace Wound: clean and dry  Lab Results: Recent Labs    04/09/19 1731 04/10/19 0430  WBC 16.9* 16.0*  HGB 8.8* 7.8*  HCT 27.1* 24.5*  PLT 200 172   BMET:  Recent Labs    04/09/19 1731 04/10/19 0430  NA 138 136  K 3.6 3.8  CL 109 106  CO2 25 24  GLUCOSE 124* 107*  BUN 10 10  CREATININE 0.84 0.79  CALCIUM 7.3* 7.4*    PT/INR:  Recent Labs    04/08/19 1320  LABPROT 18.5*  INR 1.6*   ABG    Component Value Date/Time   PHART 7.335 (L) 04/08/2019 1712   HCO3 21.4 04/08/2019 1712   TCO2 23 04/08/2019 1712   ACIDBASEDEF 4.0 (H) 04/08/2019 1712   O2SAT 100.0 04/08/2019 1712   CBG (last 3)  Recent Labs    04/10/19 1127 04/11/19 0047 04/11/19 0509  GLUCAP 107* 89 82    Assessment/Plan: S/P Procedure(s)  (LRB): CORONARY ARTERY BYPASS GRAFTING (CABG) x two , using left internal mammary artery and right leg greater saphenous vein harvested endoscopically (N/A) TRANSESOPHAGEAL ECHOCARDIOGRAM (TEE) (N/A)  1. CV- NSR, BP improving, running in the low 100s- on low dose BB 2. Pulm- no acute issues, CXR not yet completed, good use of IS 3. Renal- creatinine has been stable, weight is stable- transition to oral Lasix 40 mg daily, K supplements ordered 4. GI- nausea improved, oral intake has increased some, continue antiemetics 5. Dispo- patient stable, repeat labs in AM, going downstairs for CXR, BP improved after transfusion yesterday, transfer to 4E   LOS: 3 days    Ellwood Handler 04/11/2019  Ready to transfer to stepdown CXR with low lung volumes which will improve with ambulation patient examined and medical record reviewed,agree with above note. Tharon Aquas Trigt III 04/11/2019

## 2019-04-12 LAB — BASIC METABOLIC PANEL
Anion gap: 9 (ref 5–15)
BUN: 6 mg/dL (ref 6–20)
CO2: 25 mmol/L (ref 22–32)
Calcium: 8.2 mg/dL — ABNORMAL LOW (ref 8.9–10.3)
Chloride: 104 mmol/L (ref 98–111)
Creatinine, Ser: 0.73 mg/dL (ref 0.44–1.00)
GFR calc Af Amer: >60 mL/min (ref >60)
GFR calc non Af Amer: >60 mL/min (ref >60)
Glucose, Bld: 105 mg/dL — ABNORMAL HIGH (ref 70–99)
Potassium: 3.7 mmol/L (ref 3.5–5.1)
Sodium: 138 mmol/L (ref 135–145)

## 2019-04-12 LAB — CBC
HCT: 32.3 % — ABNORMAL LOW (ref 36.0–46.0)
Hemoglobin: 10.6 g/dL — ABNORMAL LOW (ref 12.0–15.0)
MCH: 30.9 pg (ref 26.0–34.0)
MCHC: 32.8 g/dL (ref 30.0–36.0)
MCV: 94.2 fL (ref 80.0–100.0)
Platelets: 253 10*3/uL (ref 150–400)
RBC: 3.43 MIL/uL — ABNORMAL LOW (ref 3.87–5.11)
RDW: 14.5 % (ref 11.5–15.5)
WBC: 12 10*3/uL — ABNORMAL HIGH (ref 4.0–10.5)
nRBC: 0 % (ref 0.0–0.2)

## 2019-04-12 MED ORDER — TRIAMCINOLONE ACETONIDE 0.5 % EX CREA
TOPICAL_CREAM | Freq: Two times a day (BID) | CUTANEOUS | Status: DC
  Administered 2019-04-12: 21:00:00 via TOPICAL
  Filled 2019-04-12: qty 15

## 2019-04-12 MED ORDER — POTASSIUM CHLORIDE CRYS ER 20 MEQ PO TBCR
40.0000 meq | EXTENDED_RELEASE_TABLET | Freq: Every day | ORAL | Status: AC
  Administered 2019-04-12: 40 meq via ORAL
  Filled 2019-04-12: qty 2

## 2019-04-12 NOTE — Progress Notes (Signed)
Epicardiac wires removed at 11:10 without difficulty.  Tolerated well.  Wires intact upon removal. No drainage,  Vital signs q15 mins.  Bed rest initiated

## 2019-04-12 NOTE — Progress Notes (Signed)
CARDIAC REHAB PHASE I   PRE:  Rate/Rhythm: 88 SR  BP:  Sitting: 99/73      SaO2: 97 RA  MODE:  Ambulation: 370 ft   POST:  Rate/Rhythm: 107 ST  BP:  Sitting: 122/77    SaO2: 100 RA   Pt ambulated 349ft in hallway independently with slow steady gait. Pt took several short standing rest breaks c/o some SOB. D/c education completed with pt. Pt educated on showering and monitoring incisions daily. Encouraged continued IS use, walks and sternal precautions. Pt given in-the-tube sheet. Reviewed restrictions, exercise guidelines and diet with pt. Facetimed pts family and answered questions. Will refer to CRP II [place], pt agreeable to Virtual Cardiac Rehab.   Pt is interested in participating in Virtual Cardiac Rehab. Pt advised that Virtual Cardiac Rehab is provided at no cost to the patient.  Checklist:  1. Pt has smart device  ie smartphone and/or ipad for downloading an app  Yes 2. Reliable internet/wifi service    Yes 3. Understands how to use their smartphone and navigate within an app.  Yes   Reviewed with pt the scheduling process for virtual cardiac rehab.  Pt verbalized understanding.   [id number] Rufina Falco, RN BSN 04/12/2019 9:41 AM

## 2019-04-12 NOTE — Discharge Instructions (Signed)
Discharge Instructions:  1. You may shower, please wash incisions daily with soap and water and keep dry.  If you wish to cover wounds with dressing you may do so but please keep clean and change daily.  No tub baths or swimming until incisions have completely healed.  If your incisions become red or develop any drainage please call our office at [phone]  2. No Driving until cleared by Dr. Prescott Gum office and you are no longer using narcotic pain medications  3. Monitor your weight daily.. Please use the same scale and weigh at same time... If you gain 5-10 lbs in 48 hours with associated lower extremity swelling, please contact our office at [phone]  4. Fever of 101.5 for at least 24 hours with no source, please contact our office at [phone]  5. Activity- up as tolerated, please walk at least 3 times per day.  Avoid strenuous activity, no lifting, pushing, or pulling with your arms over 8-10 lbs for a minimum of 6 weeks  6. If any questions or concerns arise, please do not hesitate to contact our office at [phone]

## 2019-04-12 NOTE — Progress Notes (Addendum)
      [address]       [place] [address]             [phone]      4 Days Post-Op Procedure(s) (LRB): CORONARY ARTERY BYPASS GRAFTING (CABG) x two , using left internal mammary artery and right leg greater saphenous vein harvested endoscopically (N/A) TRANSESOPHAGEAL ECHOCARDIOGRAM (TEE) (N/A)   Subjective:  No new complaints.  Feels better today.   Objective: Vital signs in last 24 hours: Temp:  [98 F (36.7 C)-98.9 F (37.2 C)] 98.3 F (36.8 C) (06/26 0531) Pulse Rate:  [83-105] 88 (06/26 0531) Cardiac Rhythm: Normal sinus rhythm (06/25 1938) Resp:  [18-21] 18 (06/26 0531) BP: (107-128)/(65-85) 124/83 (06/26 0531) SpO2:  [91 %-100 %] 100 % (06/26 0531) Weight:  [84.3 kg] 84.3 kg (06/26 0457)  Intake/Output from previous day: 06/25 0701 - 06/26 0700 In: 400 [P.O.:400] Out: -   General appearance: alert, cooperative and no distress Heart: regular rate and rhythm Lungs: clear to auscultation bilaterally Abdomen: soft, non-tender; bowel sounds normal; no masses,  no organomegaly Extremities: edema trace Wound: clean and dry  Lab Results: Recent Labs    04/11/19 0833 04/12/19 0544  WBC 12.5* 12.0*  HGB 9.7* 10.6*  HCT 29.9* 32.3*  PLT 208 253   BMET:  Recent Labs    04/11/19 0833 04/12/19 0544  NA 136 138  K 3.8 3.7  CL 106 104  CO2 27 25  GLUCOSE 111* 105*  BUN 6 6  CREATININE 0.62 0.73  CALCIUM 7.8* 8.2*    PT/INR: No results for input(s): LABPROT, INR in the last 72 hours. ABG    Component Value Date/Time   PHART 7.335 (L) 04/08/2019 1712   HCO3 21.4 04/08/2019 1712   TCO2 23 04/08/2019 1712   ACIDBASEDEF 4.0 (H) 04/08/2019 1712   O2SAT 100.0 04/08/2019 1712   CBG (last 3)  Recent Labs    04/11/19 1120 04/11/19 1557 04/11/19 2125  GLUCAP 96 74 86    Assessment/Plan: S/P Procedure(s) (LRB): CORONARY ARTERY BYPASS GRAFTING (CABG) x two , using left internal mammary artery and right leg greater saphenous vein  harvested endoscopically (N/A) TRANSESOPHAGEAL ECHOCARDIOGRAM (TEE) (N/A)  1. CV- NSR, BP is controlled-continue Lopressor 12.5 mg BID 2. Pulm- no acute issues, continue IS 3. Renal- creatinine has been stable, weight is trending down, continue Lasix 4. Expected post operative blood loss anemia, Hgb up to 10.6 after transfusion 5. Dispo- patient stable, will d/c EPW today, Hgb better after blood transfusion, if remains stable for d/c in AM   LOS: 4 days    [name]     Patient has adverse reaction to tramadol- donot prescribe- use oxycodone send homren po metoprolol and donot resume carvedilol, Brilllinta   patient examined and medical record reviewed,agree with above note. [name] 04/12/2019

## 2019-04-12 NOTE — Consult Note (Signed)
   [facility] CM Inpatient Consult   04/12/2019  BERTIE MCCONATHY 01/15/1966 [id number]   Follow up: referral   Call received from inpatient [facility], Steffanie Dunn, that patient requesting follow up for new primary care provider when her current provider retires to ensure an in network provider.  Called the patient's room and patient states she is get ready to have her pacing wires removed.  Will call her back later this afternoon and notified Kristi, Inpatient TOC.  1300:  Follow up call with patient to follow up on request.  HIPAA verified.  Encouraged the patient to go on the [employer] and she can find the physician list of in network providers and that she can also follow up with her [place], Marcie Bal, if more assistance is needed.    Plan:  Will update [facility] RNCM of patient needs.  For questions, please contact:  Natividad Brood, RN BSN [facility] Liaison  [phone] business mobile phone Toll free office [phone]  Fax number: [phone] [email or web address]

## 2019-04-12 NOTE — Plan of Care (Signed)
POC in place and progressing 

## 2019-04-13 MED ORDER — METOPROLOL TARTRATE 25 MG PO TABS: 12.5000 mg | ORAL_TABLET | Freq: Two times a day (BID) | ORAL | 2 refills | Status: DC

## 2019-04-13 MED ORDER — OXYCODONE-ACETAMINOPHEN 5-325 MG PO TABS: 1.0000 | ORAL_TABLET | ORAL | 0 refills | Status: AC | PRN

## 2019-04-13 MED FILL — OXYCODONE-ACETAMINOPHEN 5-3: 5-325 | 5 days supply | Qty: 30 | Fill #0

## 2019-04-13 MED FILL — METOPROLOL TARTRATE 25 MG T: 25 | 60 days supply | Qty: 60 | Fill #0

## 2019-04-13 NOTE — Progress Notes (Signed)
Discharged to home with family office visits in place teaching done  

## 2019-04-13 NOTE — Progress Notes (Signed)
Pt ambulated to the bathroom and back to bed, alert and oriented in no distress, refuses her Reglan, we'll continue to monitor.

## 2019-04-13 NOTE — Progress Notes (Signed)
Patient ambulated in the hallway (both halls, approximatively  840 ft) and tolerated well. Pt back in room on the recliner, call light within reach.

## 2019-04-13 NOTE — Progress Notes (Signed)
5 Days Post-Op Procedure(s) (LRB): CORONARY ARTERY BYPASS GRAFTING (CABG) x two , using left internal mammary artery and right leg greater saphenous vein harvested endoscopically (N/A) TRANSESOPHAGEAL ECHOCARDIOGRAM (TEE) (N/A) Subjective: Feels good, no problems or concerns. Anxious to go home.  Objective: Vital signs in last 24 hours: Temp:  [98 F (36.7 C)-98.3 F (36.8 C)] 98.3 F (36.8 C) (06/27 0758) Pulse Rate:  [78-98] 89 (06/27 0500) Cardiac Rhythm: Normal sinus rhythm (06/27 0701) Resp:  [13-30] 17 (06/27 0758) BP: (109-138)/(75-90) 111/75 (06/27 0758) SpO2:  [97 %-98 %] 97 % (06/27 0500) Weight:  [83.6 kg] 83.6 kg (06/27 0500)      Intake/Output from previous day: 06/26 0701 - 06/27 0700 In: 240 [P.O.:240] Out: -  Intake/Output this shift: No intake/output data recorded.  General appearance: alert, cooperative and no distress Heart: regular rate and rhythm Lungs: clear to auscultation bilaterally Abdomen: soft, non-tender; bowel sounds normal; no masses,  no organomegaly Extremities: edema trace Wound: clean and dry  Lab Results: Recent Labs    04/11/19 0833 04/12/19 0544  WBC 12.5* 12.0*  HGB 9.7* 10.6*  HCT 29.9* 32.3*  PLT 208 253   BMET:  Recent Labs    04/11/19 0833 04/12/19 0544  NA 136 138  K 3.8 3.7  CL 106 104  CO2 27 25  GLUCOSE 111* 105*  BUN 6 6  CREATININE 0.62 0.73  CALCIUM 7.8* 8.2*    PT/INR: No results for input(s): LABPROT, INR in the last 72 hours. ABG    Component Value Date/Time   PHART 7.335 (L) 04/08/2019 1712   HCO3 21.4 04/08/2019 1712   TCO2 23 04/08/2019 1712   ACIDBASEDEF 4.0 (H) 04/08/2019 1712   O2SAT 100.0 04/08/2019 1712   CBG (last 3)  Recent Labs    04/11/19 1120 04/11/19 1557 04/11/19 2125  GLUCAP 96 74 86    Assessment/Plan: S/P Procedure(s) (LRB): CORONARY ARTERY BYPASS GRAFTING (CABG) x two , using left internal mammary artery and right leg greater saphenous vein harvested endoscopically  (N/A) TRANSESOPHAGEAL ECHOCARDIOGRAM (TEE) (N/A)  POD5 CABG x 2. Excellent progress. Independent with mobility. Stable cardiac rhythm. Sats OK on RA. Discharge to home today.  Instructions given.    LOS: 5 days    Antony Odea, [place] [phone] 04/13/2019

## 2019-04-15 ENCOUNTER — Telehealth: Payer: Self-pay

## 2019-04-15 ENCOUNTER — Encounter: Payer: Self-pay | Admitting: *Deleted

## 2019-04-15 ENCOUNTER — Other Ambulatory Visit: Payer: Self-pay | Admitting: *Deleted

## 2019-04-15 NOTE — Telephone Encounter (Signed)
Patient called office back.  Stated that her incision feels "tight" especially when applying the betadine.  Advised that she could stop applying the betadine but not place any creams or lotions on the incision.  Reiterated to continue to wash with soap and water when showering and to pat dry.  She acknowledged receipt.  Patient is aware of her suture removal appointment 04/18/2019.

## 2019-04-15 NOTE — Telephone Encounter (Signed)
Attempted to contact [name] in regards to questions about incision care.  Left a voicemail message for a return call.  Patient is s/p CABG x2 with Dr. Prescott Gum on 04/08/2019.

## 2019-04-15 NOTE — Patient Outreach (Signed)
[facility]) Care Management  04/15/2019  [name] [birth year] [id number]  Transition of care call/case closure   Referral received: 04/03/19 Initial outreach: 04/15/19 Insurance: [place] Choice Plan   Subjective: Initial successful telephone call to patient's preferred (mobile) number in order to complete transition of care assessment; 2 HIPAA identifiers verified. Explained purpose of call and completed transition of care assessment.  [name] states she is doing OK, denies post-operative problems, says surgical incisions are unremarkable, states surgical pain well managed with prescribed medications although she is trying to take only Tylenol instead of the narcotic analgesic, tolerating diet, denies bowel or bladder problems.  Spouse and daughter are assisting with his/her recovery.  She says she has  not established a primary care provider but hopes to establish with Dr. Birdie Riddle if she will take her as a new patient.   Objective:  [name] was hospitalized at [facility] from 6/22-6/27/2020 for coronary artery bypass grafting x2 (left internal mammary artery to left anterior descending, saphenous vein graft to first diagonal) and endoscopic harvest of right leg greater saphenous vein.  Comorbidities include: acute combined systolic and diastolic heart failure, acute non- St elevated MI, CAD s/p drug eluting stent, ischemic cardiomyopathy, traumatic fracture of spine at T12-L1, hyperlipidemia, and morbid obesity She was discharged to home on 04/13/19 without the need for home health services or DME.   Assessment:  Patient voices good understanding of all discharge instructions.  See transition of care flowsheet for assessment details.   Plan:  With [name]'s permission , emailed her the [place] a Doctor resources to her personal and [facility] e-mail addresses.  Reviewed hospital discharge diagnosis of coronary artery bypass grafting and treatment plan  using hospital discharge instructions, assessing medication adherence, reviewing postoperative problems requiring provider notification, and discussing the importance of follow up with surgeon and specialists as directed. Reviewed [name]'s [employer] 2020 Wellness Requirements of: Completing the computerized Health Assessment and the Health Action Step with Active Health Management [facility]) by June 18 2019 AND have an annual physical between October 17, 2017 and June 18, 2019 in order to qualify for the 2021 Healthy Lifestyle Premium. Reviewed [name]'s chronic disease management program benefit with [employer] and encouraged her to contact them at [phone] or at [email or web address] to enroll or for questions about managing his/her chronic disease states. No ongoing care management needs identified so will close case to [facility] services and route successful outreach letter with [place] Management pamphlet and 24 Hour Nurse Line Magnet to [place] Management clinical pool to be mailed to patient's home address.   Barrington Ellison RN,CCM,CDE [place] Management Coordinator Office Phone [phone] Office Fax [phone]

## 2019-04-16 ENCOUNTER — Telehealth (HOSPITAL_COMMUNITY): Payer: Self-pay

## 2019-04-16 NOTE — Telephone Encounter (Signed)
Referral signed and received from Dr. Tamala Julian for this pt to participate in Cardiac Rehab.  Due to the departmental closure based on national recommendations for Covid-19 we are unable to provide group exercise at this time. Pt is aware of the closure and the option to participate in virtual cardiac rehab  at no cost to the patient from phase I rehab staff. Option also for hybrid cardiac rehab for in facility visits/session.  Insurance benefits and eligibility determined  will be shared with the patient for any in facility visits/sessions. Follow up appt on 04/29/2019. Continue to follow pt.

## 2019-04-16 NOTE — Telephone Encounter (Signed)
Dr.  Tamala Julian  As you are aware our department remains closed to patients due to Covid-19. We are excited to be able to offer an alternative to traditional onsite Cardiac Rehab while your patient continues to follow re-open guidelines. This is a notification that your patient has been contacted and is very interested in participating in Virtual Cardiac Rehab.  Thank you for your continued support in helping [place] meet the health care needs of our patients.  [facility]  Cardiac Rehab staff

## 2019-04-17 ENCOUNTER — Other Ambulatory Visit: Payer: Self-pay

## 2019-04-18 ENCOUNTER — Encounter (INDEPENDENT_AMBULATORY_CARE_PROVIDER_SITE_OTHER): Payer: Self-pay

## 2019-04-18 DIAGNOSIS — Z4802 Encounter for removal of sutures: Secondary | ICD-10-CM

## 2019-04-19 MED FILL — ATORVASTATIN 80 MG TABLET: 80 | 90 days supply | Qty: 90 | Fill #1

## 2019-04-21 ENCOUNTER — Other Ambulatory Visit: Payer: Self-pay

## 2019-04-21 ENCOUNTER — Encounter (HOSPITAL_COMMUNITY): Payer: Self-pay | Admitting: Oncology

## 2019-04-21 ENCOUNTER — Emergency Department (HOSPITAL_COMMUNITY): Payer: 59

## 2019-04-21 ENCOUNTER — Inpatient Hospital Stay (HOSPITAL_COMMUNITY)
Admission: EM | Admit: 2019-04-21 | Discharge: 2019-04-23 | DRG: 186 | Disposition: A | Discharge status: Home / Self Care | Payer: 59 | Attending: Internal Medicine | Admitting: Internal Medicine

## 2019-04-21 DIAGNOSIS — I309 Acute pericarditis, unspecified: Secondary | ICD-10-CM | POA: Diagnosis not present

## 2019-04-21 DIAGNOSIS — R7989 Other specified abnormal findings of blood chemistry: Secondary | ICD-10-CM

## 2019-04-21 DIAGNOSIS — R0789 Other chest pain: Secondary | ICD-10-CM | POA: Diagnosis not present

## 2019-04-21 DIAGNOSIS — I5041 Acute combined systolic (congestive) and diastolic (congestive) heart failure: Secondary | ICD-10-CM | POA: Diagnosis present

## 2019-04-21 DIAGNOSIS — I2510 Atherosclerotic heart disease of native coronary artery without angina pectoris: Secondary | ICD-10-CM | POA: Diagnosis not present

## 2019-04-21 DIAGNOSIS — R079 Chest pain, unspecified: Secondary | ICD-10-CM | POA: Diagnosis not present

## 2019-04-21 DIAGNOSIS — Z951 Presence of aortocoronary bypass graft: Secondary | ICD-10-CM

## 2019-04-21 DIAGNOSIS — I252 Old myocardial infarction: Secondary | ICD-10-CM

## 2019-04-21 DIAGNOSIS — Z79899 Other long term (current) drug therapy: Secondary | ICD-10-CM

## 2019-04-21 DIAGNOSIS — Z1159 Encounter for screening for other viral diseases: Secondary | ICD-10-CM | POA: Diagnosis not present

## 2019-04-21 DIAGNOSIS — E785 Hyperlipidemia, unspecified: Secondary | ICD-10-CM | POA: Diagnosis present

## 2019-04-21 DIAGNOSIS — Z955 Presence of coronary angioplasty implant and graft: Secondary | ICD-10-CM

## 2019-04-21 DIAGNOSIS — M79602 Pain in left arm: Secondary | ICD-10-CM | POA: Diagnosis not present

## 2019-04-21 DIAGNOSIS — Z8249 Family history of ischemic heart disease and other diseases of the circulatory system: Secondary | ICD-10-CM

## 2019-04-21 DIAGNOSIS — I214 Non-ST elevation (NSTEMI) myocardial infarction: Secondary | ICD-10-CM | POA: Diagnosis present

## 2019-04-21 DIAGNOSIS — J9811 Atelectasis: Secondary | ICD-10-CM | POA: Diagnosis present

## 2019-04-21 DIAGNOSIS — Z7982 Long term (current) use of aspirin: Secondary | ICD-10-CM

## 2019-04-21 DIAGNOSIS — Z888 Allergy status to other drugs, medicaments and biological substances status: Secondary | ICD-10-CM

## 2019-04-21 DIAGNOSIS — I110 Hypertensive heart disease with heart failure: Secondary | ICD-10-CM | POA: Diagnosis not present

## 2019-04-21 DIAGNOSIS — R6884 Jaw pain: Secondary | ICD-10-CM | POA: Diagnosis not present

## 2019-04-21 DIAGNOSIS — Z79891 Long term (current) use of opiate analgesic: Secondary | ICD-10-CM

## 2019-04-21 DIAGNOSIS — R778 Other specified abnormalities of plasma proteins: Secondary | ICD-10-CM

## 2019-04-21 DIAGNOSIS — Z6836 Body mass index (BMI) 36.0-36.9, adult: Secondary | ICD-10-CM

## 2019-04-21 DIAGNOSIS — Z539 Procedure and treatment not carried out, unspecified reason: Secondary | ICD-10-CM | POA: Diagnosis present

## 2019-04-21 DIAGNOSIS — J90 Pleural effusion, not elsewhere classified: Secondary | ICD-10-CM | POA: Diagnosis not present

## 2019-04-21 DIAGNOSIS — Z20828 Contact with and (suspected) exposure to other viral communicable diseases: Secondary | ICD-10-CM | POA: Diagnosis not present

## 2019-04-21 DIAGNOSIS — Z9861 Coronary angioplasty status: Secondary | ICD-10-CM

## 2019-04-21 LAB — BASIC METABOLIC PANEL
Anion gap: 9 (ref 5–15)
BUN: 11 mg/dL (ref 6–20)
CO2: 22 mmol/L (ref 22–32)
Calcium: 8.9 mg/dL (ref 8.9–10.3)
Chloride: 107 mmol/L (ref 98–111)
Creatinine, Ser: 0.78 mg/dL (ref 0.44–1.00)
GFR calc Af Amer: >60 mL/min (ref >60)
GFR calc non Af Amer: >60 mL/min (ref >60)
Glucose, Bld: 109 mg/dL — ABNORMAL HIGH (ref 70–99)
Potassium: 3.8 mmol/L (ref 3.5–5.1)
Sodium: 138 mmol/L (ref 135–145)

## 2019-04-21 LAB — CBC
HCT: 32.2 % — ABNORMAL LOW (ref 36.0–46.0)
Hemoglobin: 10.4 g/dL — ABNORMAL LOW (ref 12.0–15.0)
MCH: 30.9 pg (ref 26.0–34.0)
MCHC: 32.3 g/dL (ref 30.0–36.0)
MCV: 95.5 fL (ref 80.0–100.0)
Platelets: 486 10*3/uL — ABNORMAL HIGH (ref 150–400)
RBC: 3.37 MIL/uL — ABNORMAL LOW (ref 3.87–5.11)
RDW: 13.4 % (ref 11.5–15.5)
WBC: 19.3 10*3/uL — ABNORMAL HIGH (ref 4.0–10.5)
nRBC: 0 % (ref 0.0–0.2)

## 2019-04-21 LAB — I-STAT BETA HCG BLOOD, ED (MC, WL, AP ONLY): I-stat hCG, quantitative: <5 m[IU]/mL (ref <5)

## 2019-04-21 LAB — CBG MONITORING, ED: Glucose-Capillary: 120 mg/dL — ABNORMAL HIGH (ref 70–99)

## 2019-04-21 LAB — TROPONIN I (HIGH SENSITIVITY): Troponin I (High Sensitivity): 19 ng/L — ABNORMAL HIGH (ref <18)

## 2019-04-21 MED ORDER — MORPHINE SULFATE (PF) 2 MG/ML IV SOLN
2.0000 mg | Freq: Once | INTRAVENOUS | Status: AC
  Administered 2019-04-21: 2 mg via INTRAVENOUS
  Filled 2019-04-21: qty 1

## 2019-04-21 MED ORDER — SODIUM CHLORIDE 0.9% FLUSH: 3.0000 mL | Freq: Once | INTRAVENOUS | Status: DC

## 2019-04-21 MED ORDER — ASPIRIN 81 MG PO CHEW: 324.0000 mg | CHEWABLE_TABLET | Freq: Once | ORAL | Status: DC

## 2019-04-21 NOTE — ED Provider Notes (Signed)
[facility] EMERGENCY DEPARTMENT Provider Note   CSN: [id number] Arrival date & time: 04/21/19  2139    History   Chief Complaint Chief Complaint  Patient presents with  . Chest Pain    HPI [name] is a 53 y.o. female with h/o anterior MI 12/2018 s/p LHC showing LAD stenosis s/p PCI, recurrent CP and repeat cath showed progression of her LAD disease s/p CABG 6/22 by Dr Prescott Gum, HTN, HLD presents to the ER for evaluation of sudden pain in her back between her shoulder blades around 7 PM.  Associated with right-sided chest "heaviness", right jaw pain, forehead diaphoresis.  Patient has been taking short walks at home since her CABG and was stepping out of her house to walk when she had the sudden onset of symptoms.  States for the last 2 to 3 days she has felt really tired, has not been able to sleep at all during the night and has been exhausted during the day.  Has had a lingering cough since her CABG on 6/22 that is actually improving, now dry.  No fever, chills.  She took 2 nitro sublingual at home and felt like this made her pain worse.  EMS gave her morphine, nitro and aspirin now feels like her chest pressure and back pain is less intense, 7/10.  During her last MI she had pressure in the front/back and had jaw pain as well but it was significantly worse.  Since surgery she has felt panicky when she lays flat because it causes chest wall pain.  No sick contacts. No exposure to COVID. No travel. Has been staying at home, went to pharmacy but wearing mask when she is out. No LE edema, calf pain. Complaint with all meds.     HPI  Past Medical History:  Diagnosis Date  . Acute non-ST elevation myocardial infarction (NSTEMI) ([place]) 12/16/2018  . CAD S/P DES PCI -pLAD 12/16/2018   Cath 12/15/2017: Successful proximal LAD synergy 3.0 x 15 DES postdilated to 3.25 mm reducing the stenosis to 0% ; ? 60-70% ostial LAD noted on some images.  Marland Kitchen PONV (postoperative nausea and  vomiting)     Patient Active Problem List   Diagnosis Date Noted  . Chest pain 04/22/2019  . 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 Morbid obesity ([place]) 12/18/2018  . Ischemic cardiomyopathy -in setting of anterior non-STEMI 12/17/2018  . Acute combined systolic and diastolic heart failure ([place]) 12/17/2018  . Acute non-ST elevation myocardial infarction (NSTEMI) ([place]) 12/16/2018  . CAD S/P DES PCI -pLAD 12/16/2018  . Hyperlipidemia with target LDL less than 70 12/16/2018  . Traumatic fracture of spine at T12-L1 level, sequela 03/27/2014  . Encounter for IUD removal 05/16/2013    Class: Status post  . S/P tubal ligation 05/16/2013    Class: Status post  . IRREGULAR MENSES 09/29/2009    Past Surgical History:  Procedure Laterality Date  . CESAREAN SECTION    . CORONARY ARTERY BYPASS GRAFT N/A 04/08/2019   Procedure: CORONARY ARTERY BYPASS GRAFTING (CABG) x two , using left internal mammary artery and right leg greater saphenous vein harvested endoscopically;  Surgeon: Ivin Poot, MD;  Location: [place];  Service: Open Heart Surgery;  Laterality: N/A;  . CORONARY/GRAFT ACUTE MI REVASCULARIZATION N/A 12/16/2018   Procedure: Coronary/Graft Acute MI Revascularization;  Surgeon: Belva Crome, MD;  Location: [place] [facility];  Service: Cardiovascular;  Laterality: N/A;  . IUD REMOVAL N/A 05/16/2013   Procedure:  INTRAUTERINE DEVICE (IUD) REMOVAL;  Surgeon: Darlyn Chamber, MD;  Location: [place] [facility];  Service: Gynecology;  Laterality: N/A;  . LAPAROSCOPIC TUBAL LIGATION Bilateral 05/16/2013   Procedure: LAPAROSCOPIC TUBAL LIGATION;  Surgeon: Darlyn Chamber, MD;  Location: [place] [facility];  Service: Gynecology;  Laterality: Bilateral;  . LEFT HEART CATH AND CORONARY ANGIOGRAPHY N/A 12/16/2018   Procedure: LEFT HEART CATH AND CORONARY ANGIOGRAPHY;  Surgeon: Belva Crome, MD;  Location: [place] [facility];  Service: Cardiovascular;  Laterality: N/A;  . LEFT HEART CATH AND CORONARY ANGIOGRAPHY N/A 03/26/2019   Procedure:  LEFT HEART CATH AND CORONARY ANGIOGRAPHY;  Surgeon: Belva Crome, MD;  Location: [place] [facility];  Service: Cardiovascular;  Laterality: N/A;  . TEE WITHOUT CARDIOVERSION N/A 04/08/2019   Procedure: TRANSESOPHAGEAL ECHOCARDIOGRAM (TEE);  Surgeon: Prescott Gum, Collier Salina, MD;  Location: [place];  Service: Open Heart Surgery;  Laterality: N/A;     OB History   No obstetric history on file.      Home Medications    Prior to Admission medications   Medication Sig Start Date End Date Taking? Authorizing Provider  acetaminophen (TYLENOL) 325 MG tablet Take 650 mg by mouth every 6 (six) hours as needed for mild pain.    Yes [provider]  aspirin 81 MG chewable tablet Chew 1 tablet (81 mg total) by mouth daily. 12/19/18  Yes Belva Crome, MD  atorvastatin (LIPITOR) 80 MG tablet Take 1 tablet (80 mg total) by mouth daily at 6 PM. 01/01/19  Yes Burtis Junes, NP  loratadine (CLARITIN) 10 MG tablet Take 10 mg by mouth daily as needed for allergies.   Yes [provider]  metoprolol tartrate (LOPRESSOR) 25 MG tablet Take 0.5 tablets (12.5 mg total) by mouth 2 (two) times daily for 30 days. 04/13/19 05/13/19 Yes Roddenberry, Arlis Porta, PA-C  nitroGLYCERIN (NITROSTAT) 0.4 MG SL tablet Place 1 tablet (0.4 mg total) under the tongue every 5 (five) minutes as needed for chest pain. 12/18/18  Yes Belva Crome, MD  oxyCODONE-acetaminophen (PERCOCET/ROXICET) 5-325 MG tablet Take 1-2 tablets by mouth every 4 (four) hours as needed for moderate pain or severe pain.   Yes [provider]    Family History Family History  Problem Relation Age of Onset  . Heart disease Other   . Colon cancer Other   . Heart attack Other   . Colon cancer Father   . High blood pressure Mother   . Heart attack Mother        2 stents  . CAD Mother   . Hyperlipidemia Mother   . Hyperlipidemia Sister   . Heart failure Maternal Grandmother   . Heart attack Maternal Grandfather        deceased 7  .  Cancer Paternal Grandfather     Social History Social History   Tobacco Use  . Smoking status: Never Smoker  . Smokeless tobacco: Never Used  Substance Use Topics  . Alcohol use: Yes    Comment:  once a month  . Drug use: No     Allergies   Tramadol and Tetracycline   Review of Systems Review of Systems  Constitutional: Positive for diaphoresis.  HENT:       Jaw pain   Cardiovascular: Positive for chest pain.  All other systems reviewed and are negative.    Physical Exam Updated Vital Signs BP 117/73   Pulse 91   Temp 98.2 F (36.8 C) (Oral)   Resp 17  Ht 4\' 11"  (1.499 m)   Wt 82.6 kg   LMP 12/16/2018 Comment: unsure  SpO2 91%   BMI 36.76 kg/m   Physical Exam Vitals signs and nursing note reviewed.  Constitutional:      General: She is not in acute distress.    Appearance: She is well-developed.     Comments: Looks uncomfortable.  HENT:     Head: Normocephalic and atraumatic.     Right Ear: External ear normal.     Left Ear: External ear normal.     Nose: Nose normal.  Eyes:     General: No scleral icterus.    Conjunctiva/sclera: Conjunctivae normal.  Neck:     Musculoskeletal: Normal range of motion and neck supple.  Cardiovascular:     Rate and Rhythm: Normal rate and regular rhythm.     Pulses:          Radial pulses are 1+ on the right side and 1+ on the left side.       Dorsalis pedis pulses are 1+ on the right side and 1+ on the left side.     Heart sounds: Normal heart sounds. No murmur.     Comments: No asymmetric lower extremity edema.  No calf tenderness. Pulmonary:     Effort: Pulmonary effort is normal.     Breath sounds: Decreased breath sounds present. No wheezing.     Comments: Diminished lung sounds to lower lobes anteriorly and posteriorly, patient is having pain with inspiration.  Exam is limited due to body habitus. Chest:     Comments: Vertical sternal scar well approximated without erythema, edema, dehiscence.  Mildly  locally tender. Musculoskeletal: Normal range of motion.        General: No deformity.  Skin:    General: Skin is warm and dry.     Capillary Refill: Capillary refill takes less than 2 seconds.     Comments: Right medial thigh surgical scar noted, well approximated without erythema, edema, fluctuance or dehiscence.  Sensation and strength intact in upper and lower extremities.  Neurological:     Mental Status: She is alert and oriented to person, place, and time.  Psychiatric:        Behavior: Behavior normal.        Thought Content: Thought content normal.        Judgment: Judgment normal.      ED Treatments / Results  Labs (all labs ordered are listed, but only abnormal results are displayed) Labs Reviewed  BASIC METABOLIC PANEL - Abnormal; Notable for the following components:      Result Value   Glucose, Bld 109 (*)    All other components within normal limits  CBC - Abnormal; Notable for the following components:   WBC 19.3 (*)    RBC 3.37 (*)    Hemoglobin 10.4 (*)    HCT 32.2 (*)    Platelets 486 (*)    All other components within normal limits  TROPONIN I (HIGH SENSITIVITY) - Abnormal; Notable for the following components:   Troponin I (High Sensitivity) 19 (*)    All other components within normal limits  CBG MONITORING, ED - Abnormal; Notable for the following components:   Glucose-Capillary 120 (*)    All other components within normal limits  SARS CORONAVIRUS 2 (HOSPITAL ORDER, [place] [facility])  CULTURE, BLOOD (ROUTINE X 2)  CULTURE, BLOOD (ROUTINE X 2)  TROPONIN I (HIGH SENSITIVITY)  LACTIC ACID, PLASMA  LACTIC ACID, PLASMA  I-STAT BETA HCG BLOOD, ED ([facility], [facility], [facility] ONLY)    EKG EKG Interpretation  Date/Time:  Sunday April 21 2019 23:15:20 EDT Ventricular Rate:  88 PR Interval:    QRS Duration: 83 QT Interval:  371 QTC Calculation: 449 R Axis:   49 Text Interpretation:  Sinus rhythm Borderline repolarization abnormality T wave  abnormality in anterior leads NO STEMI. Confirmed by Addison Lank [phone]) on 04/22/2019 12:51:29 AM   Radiology Dg Chest 2 View  Result Date: 04/21/2019 CLINICAL DATA:  Chest pain status post CABG EXAM: CHEST - 2 VIEW COMPARISON:  Chest radiograph 04/11/2019 FINDINGS: Monitoring leads overlie the patient. Stable cardiac and mediastinal contours status post median sternotomy. Low lung volumes. Slight interval increase in retrocardiac consolidation. Small left pleural effusion. No pneumothorax. Regional skeleton is unremarkable. IMPRESSION: Small left pleural effusion with mild interval increase in retrocardiac consolidation which may represent atelectasis. Infection not excluded. Electronically Signed   By: Lovey Newcomer M.D.   On: 04/21/2019 22:24   Ct Angio Chest Pe W And/or Wo Contrast  Result Date: 04/22/2019 CLINICAL DATA:  Chest pain EXAM: CT ANGIOGRAPHY CHEST WITH CONTRAST TECHNIQUE: Multidetector CT imaging of the chest was performed using the standard protocol during bolus administration of intravenous contrast. Multiplanar CT image reconstructions and MIPs were obtained to evaluate the vascular anatomy. CONTRAST:  23mL OMNIPAQUE IOHEXOL 350 MG/ML SOLN COMPARISON:  Radiograph 04/21/2019 FINDINGS: Cardiovascular: Satisfactory opacification of the pulmonary arteries to the segmental level. No evidence of pulmonary embolism. Normal heart size. Small pericardial effusion. Coronary artery calcification. Nonaneurysmal aorta. No dissection is seen. Post CABG changes. Mediastinum/Nodes: Midline trachea. No thyroid mass. Mild haziness within the anterior mediastinum, could be secondary to postop change and small amount of superior pericardial fluid. No significantly enlarged lymph nodes. Esophagus is within normal limits. Lungs/Pleura: Moderate left pleural effusion. Partial atelectasis in the left lower lobe versus pneumonia. No pneumothorax. Upper Abdomen: Cyst within the left hepatic lobe. No acute  abnormality Musculoskeletal: Recent sternotomy.  No acute osseous abnormality. Review of the MIP images confirms the above findings. IMPRESSION: 1. Negative for acute pulmonary embolus or aortic dissection. 2. Moderate left pleural effusion with probable passive atelectasis at the left lower lobe 3. Recent sternotomy. Mild fluid and soft tissue stranding in the anterior mediastinum is presumably resolving operative change. Electronically Signed   By: Donavan Foil M.D.   On: 04/22/2019 00:47    Procedures Procedures (including critical care time)  Medications Ordered in ED Medications  sodium chloride flush (NS) 0.9 % injection 3 mL (has no administration in time range)  aspirin chewable tablet 324 mg (324 mg Oral Not Given 04/21/19 2324)  ceFEPIme (MAXIPIME) 2 g in sodium chloride 0.9 % 100 mL IVPB (has no administration in time range)  vancomycin (VANCOCIN) IVPB 1000 mg/200 mL premix (has no administration in time range)  morphine 2 MG/ML injection 2 mg (2 mg Intravenous Given 04/21/19 2324)  iohexol (OMNIPAQUE) 350 MG/ML injection 75 mL (75 mLs Intravenous Contrast Given 04/22/19 0023)     Initial Impression / Assessment and Plan / ED Course  I have reviewed the triage vital signs and the nursing notes.  Pertinent labs & imaging results that were available during my care of the patient were reviewed by me and considered in my medical decision making (see chart for details).  Clinical Course as of Apr 21 109  Sun Apr 21, 2019  2255 WBC(!): 19.3 [CG]  2255 Small left pleural effusion with mild interval increase in retrocardiac  consolidation which may represent atelectasis. Infection not excluded.  DG Chest 2 View [CG]  Mon Apr 22, 2019  0002 Troponin I (High Sensitivity)(!): 19 [CG]  0050 1. Negative for acute pulmonary embolus or aortic dissection. 2. Moderate left pleural effusion with probable passive atelectasis at the left lower lobe 3. Recent sternotomy. Mild fluid and soft tissue  stranding in the anterior mediastinum is presumably resolving operative change.  CT Angio Chest PE W and/or Wo Contrast [CG]    Clinical Course User Index [CG] Kinnie Feil, PA-C   I have reviewed pts EMR to obtained pertinent PMH.   53 year old with history of recent MI status post CABG on 6/22 here for sudden chest pressure, thoracic back pain, jaw pain, diaphoresis when starting to walk.  She is having chest pain on arrival.  Hemodynamically stable.  DDX includes pericarditis, pleural effusion, post MI myocarditis, pneumonia, pleurisy, ACS.  Also potentially postsurgical complications.  Has lingering but improving dry cough since CABG, doing IS at home putting health care associated PNA on differential. She has been more sedentary than normal and PE on differential a well, but less likely. She has no neuro/pulse deficits and given overall picture dissection is less likely.  EKG reviewed by EDMD with subtle T wave inversions in V1, V2 and V3 change from last EKG in June after CABG.  Troponin 19. WBC 19. CXR shows L pleural effusion and bigger retrocardiac consolidation vs atelectasis.  CTA without PE, moderate L pleural effusion and possible post op findings around anterior mediastinum.   Will add delta trop, blood cultures, lactic acid. Given white count, cough, will give one time dose of HAP antibiotics here.   Pt discussed with and evaluated by Dr Rosine Door emergently in ER. He agrees with admission for repeat echo, serial troponins.    Discussed pt with Dr Marlowe Sax who has accepted patient.   I have re-evaluated pt who reports improvement now "minimal" CP. HD stable. No clinical deterioration. Pending COVID, delta trop, blood cultures, lactic acid. Will update family. Discussed with EDP.  Final Clinical Impressions(s) / ED Diagnoses   Final diagnoses:  Elevated troponin  Pleural effusion, left  Chest tightness  Hx of CABG  History of MI (myocardial infarction)    ED  Discharge Orders    None       Kinnie Feil, PA-C 04/22/19 0110    Fatima Blank, MD 04/22/19 [phone]

## 2019-04-21 NOTE — ED Notes (Signed)
Patient transported to CT 

## 2019-04-21 NOTE — ED Triage Notes (Signed)
Pt bib [facility] d/t right sided CP that radiates to back between shoulder blades, left arm and jaw.  Pt is s/p CABG 04/08/19.  Pain started at approximately 1915.  Pt took 2 nitro and 324 mg asa prior to EMS arrival.  EMS gave pt 4 nitro, 10 mg morphine and 500 ml's NS en route.  Pt sates that she has felt weak the last two days, not sleeping well.

## 2019-04-21 NOTE — ED Notes (Signed)
[place]. Please call with more updates at this time.

## 2019-04-21 NOTE — ED Notes (Signed)
Patient transported to X-ray 

## 2019-04-22 ENCOUNTER — Inpatient Hospital Stay (HOSPITAL_COMMUNITY): Payer: 59

## 2019-04-22 ENCOUNTER — Observation Stay (HOSPITAL_COMMUNITY): Payer: 59

## 2019-04-22 ENCOUNTER — Emergency Department (HOSPITAL_COMMUNITY): Payer: 59

## 2019-04-22 ENCOUNTER — Encounter (HOSPITAL_COMMUNITY)
Admission: EM | Disposition: A | Discharge status: Home / Self Care | Payer: Self-pay | Source: Home / Self Care | Attending: Internal Medicine

## 2019-04-22 DIAGNOSIS — R079 Chest pain, unspecified: Secondary | ICD-10-CM | POA: Diagnosis not present

## 2019-04-22 DIAGNOSIS — I309 Acute pericarditis, unspecified: Secondary | ICD-10-CM | POA: Diagnosis present

## 2019-04-22 DIAGNOSIS — R7989 Other specified abnormal findings of blood chemistry: Secondary | ICD-10-CM | POA: Diagnosis not present

## 2019-04-22 DIAGNOSIS — Z6836 Body mass index (BMI) 36.0-36.9, adult: Secondary | ICD-10-CM | POA: Diagnosis not present

## 2019-04-22 DIAGNOSIS — R778 Other specified abnormalities of plasma proteins: Secondary | ICD-10-CM

## 2019-04-22 DIAGNOSIS — I110 Hypertensive heart disease with heart failure: Secondary | ICD-10-CM | POA: Diagnosis present

## 2019-04-22 DIAGNOSIS — Z539 Procedure and treatment not carried out, unspecified reason: Secondary | ICD-10-CM | POA: Diagnosis present

## 2019-04-22 DIAGNOSIS — I2510 Atherosclerotic heart disease of native coronary artery without angina pectoris: Secondary | ICD-10-CM | POA: Diagnosis present

## 2019-04-22 DIAGNOSIS — Z1159 Encounter for screening for other viral diseases: Secondary | ICD-10-CM | POA: Diagnosis not present

## 2019-04-22 DIAGNOSIS — E785 Hyperlipidemia, unspecified: Secondary | ICD-10-CM | POA: Diagnosis present

## 2019-04-22 DIAGNOSIS — Z8249 Family history of ischemic heart disease and other diseases of the circulatory system: Secondary | ICD-10-CM | POA: Diagnosis not present

## 2019-04-22 DIAGNOSIS — I252 Old myocardial infarction: Secondary | ICD-10-CM | POA: Diagnosis not present

## 2019-04-22 DIAGNOSIS — R6884 Jaw pain: Secondary | ICD-10-CM | POA: Diagnosis present

## 2019-04-22 DIAGNOSIS — Z888 Allergy status to other drugs, medicaments and biological substances status: Secondary | ICD-10-CM | POA: Diagnosis not present

## 2019-04-22 DIAGNOSIS — R0789 Other chest pain: Secondary | ICD-10-CM | POA: Diagnosis present

## 2019-04-22 DIAGNOSIS — Z79891 Long term (current) use of opiate analgesic: Secondary | ICD-10-CM | POA: Diagnosis not present

## 2019-04-22 DIAGNOSIS — Z79899 Other long term (current) drug therapy: Secondary | ICD-10-CM | POA: Diagnosis not present

## 2019-04-22 DIAGNOSIS — I214 Non-ST elevation (NSTEMI) myocardial infarction: Secondary | ICD-10-CM | POA: Diagnosis present

## 2019-04-22 DIAGNOSIS — Z955 Presence of coronary angioplasty implant and graft: Secondary | ICD-10-CM | POA: Diagnosis not present

## 2019-04-22 DIAGNOSIS — Z7982 Long term (current) use of aspirin: Secondary | ICD-10-CM | POA: Diagnosis not present

## 2019-04-22 DIAGNOSIS — J90 Pleural effusion, not elsewhere classified: Secondary | ICD-10-CM | POA: Diagnosis not present

## 2019-04-22 DIAGNOSIS — J9811 Atelectasis: Secondary | ICD-10-CM | POA: Diagnosis present

## 2019-04-22 DIAGNOSIS — I5041 Acute combined systolic (congestive) and diastolic (congestive) heart failure: Secondary | ICD-10-CM | POA: Diagnosis present

## 2019-04-22 DIAGNOSIS — Z951 Presence of aortocoronary bypass graft: Secondary | ICD-10-CM | POA: Diagnosis not present

## 2019-04-22 LAB — LACTIC ACID, PLASMA
Lactic Acid, Venous: 1 mmol/L (ref 0.5–1.9)
Lactic Acid, Venous: 1.6 mmol/L (ref 0.5–1.9)

## 2019-04-22 LAB — ECHOCARDIOGRAM COMPLETE
Height: 59 in
Weight: 2912 oz

## 2019-04-22 LAB — CBC WITH DIFFERENTIAL/PLATELET
Abs Immature Granulocytes: 0.09 10*3/uL — ABNORMAL HIGH (ref 0.00–0.07)
Basophils Absolute: 0.1 10*3/uL (ref 0.0–0.1)
Basophils Relative: 1 %
Eosinophils Absolute: 0.2 10*3/uL (ref 0.0–0.5)
Eosinophils Relative: 1 %
HCT: 34.1 % — ABNORMAL LOW (ref 36.0–46.0)
Hemoglobin: 10.6 g/dL — ABNORMAL LOW (ref 12.0–15.0)
Immature Granulocytes: 1 %
Lymphocytes Relative: 18 %
Lymphs Abs: 3.5 10*3/uL (ref 0.7–4.0)
MCH: 30.6 pg (ref 26.0–34.0)
MCHC: 31.1 g/dL (ref 30.0–36.0)
MCV: 98.6 fL (ref 80.0–100.0)
Monocytes Absolute: 1 10*3/uL (ref 0.1–1.0)
Monocytes Relative: 5 %
Neutro Abs: 15 10*3/uL — ABNORMAL HIGH (ref 1.7–7.7)
Neutrophils Relative %: 74 %
Platelets: 497 10*3/uL — ABNORMAL HIGH (ref 150–400)
RBC: 3.46 MIL/uL — ABNORMAL LOW (ref 3.87–5.11)
RDW: 13.5 % (ref 11.5–15.5)
WBC: 19.9 10*3/uL — ABNORMAL HIGH (ref 4.0–10.5)
nRBC: 0 % (ref 0.0–0.2)

## 2019-04-22 LAB — TROPONIN I (HIGH SENSITIVITY)
Troponin I (High Sensitivity): 200 ng/L
Troponin I (High Sensitivity): 863 ng/L (ref <18)
Troponin I (High Sensitivity): 1561 ng/L (ref <18)

## 2019-04-22 LAB — PROCALCITONIN: Procalcitonin: <0.1 ng/mL

## 2019-04-22 LAB — HEPARIN LEVEL (UNFRACTIONATED): Heparin Unfractionated: 0.36 IU/mL (ref 0.30–0.70)

## 2019-04-22 LAB — SARS CORONAVIRUS 2 BY RT PCR (HOSPITAL ORDER, PERFORMED IN ~~LOC~~ HOSPITAL LAB): SARS Coronavirus 2: NEGATIVE

## 2019-04-22 LAB — HIV ANTIBODY (ROUTINE TESTING W REFLEX): HIV Screen 4th Generation wRfx: NONREACTIVE

## 2019-04-22 SURGERY — LEFT HEART CATH AND CORS/GRAFTS ANGIOGRAPHY
Anesthesia: LOCAL

## 2019-04-22 MED ORDER — ATORVASTATIN CALCIUM 80 MG PO TABS
80.0000 mg | ORAL_TABLET | Freq: Every day | ORAL | Status: DC
  Administered 2019-04-22: 80 mg via ORAL
  Filled 2019-04-22: qty 1

## 2019-04-22 MED ORDER — NITROGLYCERIN 0.4 MG SL SUBL: 0.4000 mg | SUBLINGUAL_TABLET | SUBLINGUAL | Status: DC | PRN

## 2019-04-22 MED ORDER — LORATADINE 10 MG PO TABS: 10.0000 mg | ORAL_TABLET | Freq: Every day | ORAL | Status: DC | PRN

## 2019-04-22 MED ORDER — IOHEXOL 350 MG/ML SOLN
75.0000 mL | Freq: Once | INTRAVENOUS | Status: AC | PRN
  Administered 2019-04-22: 75 mL via INTRAVENOUS

## 2019-04-22 MED ORDER — ACETAMINOPHEN 325 MG PO TABS: 650.0000 mg | ORAL_TABLET | Freq: Four times a day (QID) | ORAL | Status: DC | PRN

## 2019-04-22 MED ORDER — VANCOMYCIN HCL IN DEXTROSE 1-5 GM/200ML-% IV SOLN
1000.0000 mg | Freq: Once | INTRAVENOUS | Status: AC
  Administered 2019-04-22: 1000 mg via INTRAVENOUS
  Filled 2019-04-22: qty 200

## 2019-04-22 MED ORDER — SODIUM CHLORIDE 0.9 % IV SOLN
2.0000 g | Freq: Once | INTRAVENOUS | Status: AC
  Administered 2019-04-22: 2 g via INTRAVENOUS
  Filled 2019-04-22: qty 2

## 2019-04-22 MED ORDER — ONDANSETRON HCL 4 MG/2ML IJ SOLN: 4.0000 mg | INTRAMUSCULAR | Status: AC

## 2019-04-22 MED ORDER — MORPHINE SULFATE (PF) 2 MG/ML IV SOLN: 1.0000 mg | INTRAVENOUS | Status: DC | PRN

## 2019-04-22 MED ORDER — ONDANSETRON HCL 4 MG/2ML IJ SOLN
INTRAMUSCULAR | Status: AC
  Filled 2019-04-22: qty 2

## 2019-04-22 MED ORDER — HEPARIN BOLUS VIA INFUSION
4000.0000 [IU] | Freq: Once | INTRAVENOUS | Status: AC
  Administered 2019-04-22: 4000 [IU] via INTRAVENOUS
  Filled 2019-04-22: qty 4000

## 2019-04-22 MED ORDER — IBUPROFEN 600 MG PO TABS
600.0000 mg | ORAL_TABLET | Freq: Three times a day (TID) | ORAL | Status: DC
  Administered 2019-04-22 – 2019-04-23 (×4): 600 mg via ORAL
  Filled 2019-04-22 (×4): qty 1

## 2019-04-22 MED ORDER — PANTOPRAZOLE SODIUM 40 MG PO TBEC
40.0000 mg | DELAYED_RELEASE_TABLET | Freq: Every day | ORAL | Status: DC
  Administered 2019-04-22 – 2019-04-23 (×2): 40 mg via ORAL
  Filled 2019-04-22 (×2): qty 1

## 2019-04-22 MED ORDER — LIDOCAINE HCL 1 % IJ SOLN
INTRAMUSCULAR | Status: AC
  Filled 2019-04-22: qty 20

## 2019-04-22 MED ORDER — ASPIRIN 81 MG PO CHEW
81.0000 mg | CHEWABLE_TABLET | Freq: Every day | ORAL | Status: DC
  Administered 2019-04-22 – 2019-04-23 (×2): 81 mg via ORAL
  Filled 2019-04-22 (×2): qty 1

## 2019-04-22 MED ORDER — COLCHICINE 0.6 MG PO TABS
0.6000 mg | ORAL_TABLET | Freq: Two times a day (BID) | ORAL | Status: DC
  Administered 2019-04-22 – 2019-04-23 (×3): 0.6 mg via ORAL
  Filled 2019-04-22 (×3): qty 1

## 2019-04-22 MED ORDER — ONDANSETRON HCL 4 MG/2ML IJ SOLN: 4.0000 mg | Freq: Four times a day (QID) | INTRAMUSCULAR | Status: DC | PRN

## 2019-04-22 MED ORDER — METOPROLOL TARTRATE 12.5 MG HALF TABLET
12.5000 mg | ORAL_TABLET | Freq: Two times a day (BID) | ORAL | Status: DC
  Administered 2019-04-22 – 2019-04-23 (×3): 12.5 mg via ORAL
  Filled 2019-04-22 (×3): qty 1

## 2019-04-22 MED ORDER — HEPARIN (PORCINE) 25000 UT/250ML-% IV SOLN
1000.0000 [IU]/h | INTRAVENOUS | Status: DC
  Administered 2019-04-22: 1000 [IU]/h via INTRAVENOUS
  Filled 2019-04-22: qty 250

## 2019-04-22 NOTE — ED Notes (Signed)
Unsuccessful blood draw. Informed phlebotomist Santiago Glad

## 2019-04-22 NOTE — ED Notes (Signed)
Dr in with pt will wait until Dr Lorenza Evangelist

## 2019-04-22 NOTE — Progress Notes (Signed)
  Echocardiogram 2D Echocardiogram has been performed.  Burnett Kanaris 04/22/2019, 8:43 AM

## 2019-04-22 NOTE — Progress Notes (Signed)
  Patient is a 53 y.o. female with medical history significant of MI in March 2020 status post LHC showing LAD stenosis treated with PCI , CABG on 04/08/2019 who presented to the emergency department with complaints of chest pain. On presentation she was hemodynamically stable.  She had leukocytosis.  Found to have elevated high-sensitivity troponin.  Chest CT showed left pleural effusion.  Cardiology consulted. She was initially started on heparin drip which has been discontinued now.  Cardiology felt that this is more due to pericarditis than acute coronary syndrome.  Started on ibuprofen and colchicine.Her echocardiogram showed normal left ventricular systolic function, no wall motion abnormalities, small pericardial effusion. Attempted for left-sided thoracentesis but visualized to have very small volume so procedure canceled. This morning she was hemodynamically stable.  Very anxious about her outcome.  Denies any chest pain to me. Patient seen by Dr. Marlowe Sax this morning.

## 2019-04-22 NOTE — ED Notes (Signed)
Report given to Carlyon Shadow, RN on 6E.

## 2019-04-22 NOTE — ED Notes (Signed)
Dr. Marlowe Sax notified of critical troponin of 1561.

## 2019-04-22 NOTE — Progress Notes (Signed)
ANTICOAGULATION CONSULT NOTE - Initial Consult  Pharmacy Consult for Heparin Indication: chest pain/ACS  Allergies  Allergen Reactions  . Tramadol     Made her feel very strange and groggy  . Tetracycline Rash    Patient Measurements: Height: 4\' 11"  (149.9 cm) Weight: 182 lb (82.6 kg) IBW/kg (Calculated) : 43.2 Heparin Dosing Weight: 65 kg  Vital Signs: Temp: 98.2 F (36.8 C) (07/05 2147) Temp Source: Oral (07/05 2147) BP: 117/73 (07/05 2345) Pulse Rate: 91 (07/05 2345)  Labs: Recent Labs    04/21/19 2208  HGB 10.4*  HCT 32.2*  PLT 486*  CREATININE 0.78  TROPONINIHS 19*    Estimated Creatinine Clearance: 78.4 mL/min (by C-G formula based on SCr of 0.78 mg/dL).   Medical History: Past Medical History:  Diagnosis Date  . Acute non-ST elevation myocardial infarction (NSTEMI) ([place]) 12/16/2018  . CAD S/P DES PCI -pLAD 12/16/2018   Cath 12/15/2017: Successful proximal LAD synergy 3.0 x 15 DES postdilated to 3.25 mm reducing the stenosis to 0% ; ? 60-70% ostial LAD noted on some images.  Marland Kitchen PONV (postoperative nausea and vomiting)     Medications:  No current facility-administered medications on file prior to encounter.    Current Outpatient Medications on File Prior to Encounter  Medication Sig Dispense Refill  . acetaminophen (TYLENOL) 325 MG tablet Take 650 mg by mouth every 6 (six) hours as needed for mild pain.     Marland Kitchen aspirin 81 MG chewable tablet Chew 1 tablet (81 mg total) by mouth daily. 30 tablet 0  . atorvastatin (LIPITOR) 80 MG tablet Take 1 tablet (80 mg total) by mouth daily at 6 PM. 90 tablet 3  . loratadine (CLARITIN) 10 MG tablet Take 10 mg by mouth daily as needed for allergies.    . metoprolol tartrate (LOPRESSOR) 25 MG tablet Take 0.5 tablets (12.5 mg total) by mouth 2 (two) times daily for 30 days. 60 tablet 2  . nitroGLYCERIN (NITROSTAT) 0.4 MG SL tablet Place 1 tablet (0.4 mg total) under the tongue every 5 (five) minutes as needed for chest pain. 25  tablet 0  . oxyCODONE-acetaminophen (PERCOCET/ROXICET) 5-325 MG tablet Take 1-2 tablets by mouth every 4 (four) hours as needed for moderate pain or severe pain.       Assessment: 53 y.o. female with chest pain for heparin   Goal of Therapy:  Heparin level 0.3-0.7 units/ml Monitor platelets by anticoagulation protocol: Yes   Plan:  Heparin 4000 units IV bolus, then start heparin 1000 units/hr Check heparin level in 6 hours.   Jeffrey Voth, Bronson Curb 04/22/2019,1:19 AM

## 2019-04-22 NOTE — Progress Notes (Signed)
IR requested by Dr. Tawanna Solo for possible image-guided left thoracentesis.  Limited chest US revealed a small amount of fluid with a surrounding lung flap/spleen making percutaneous approach difficult/unsafe. Images reviewed by Dr. Anselm Pancoast who agrees. Informed patient that procedure will not occur today. Dr. Tawanna Solo made aware. All questions answered and concerns addressed.  IR available in future if needed.   Bea Graff Brodric Schauer, PA-C 04/22/2019, 10:21 AM

## 2019-04-22 NOTE — Progress Notes (Addendum)
ANTICOAGULATION CONSULT NOTE - Follow Up Consult  Pharmacy Consult for IV Heparin Indication: chest pain/ACS  Allergies  Allergen Reactions  . Tramadol     Made her feel very strange and groggy  . Tetracycline Rash    Patient Measurements: Height: 4\' 11"  (149.9 cm) Weight: 182 lb (82.6 kg) IBW/kg (Calculated) : 43.2 Heparin Dosing Weight: 65 kg  Vital Signs: Temp: 98.5 F (36.9 C) (07/06 1056) Temp Source: Oral (07/06 1056) BP: 113/78 (07/06 1056) Pulse Rate: 83 (07/06 1056)  Labs: Recent Labs    04/21/19 2208 04/22/19 0326 04/22/19 0502 04/22/19 0919  HGB 10.4* 10.6*  --   --   HCT 32.2* 34.1*  --   --   PLT 486* 497*  --   --   HEPARINUNFRC  --   --   --  0.36  CREATININE 0.78  --   --   --   TROPONINIHS 200*  19* 863* 1,561*  --     Estimated Creatinine Clearance: 78.4 mL/min (by C-G formula based on SCr of 0.78 mg/dL).   Assessment: 53 year old female s/p recent CABG in June 2020 who presents with chest pain. Pharmacy consulted to start IV Heparin. Patient was on ASA prior to admission but no other anticoagulants.   Heparin level this AM is therapeutic at 0.36 on rate of 1000 units/hr after bolus of 4000 units. H/H is low-stable. Platelets are elevated at 496. No bleeding or issues with the infusion per the RN, Stanton Kidney. Pleural effusion noted - per IR will not have thoracentesis today.  Goal of Therapy:  Heparin level 0.3-0.7 units/ml Monitor platelets by anticoagulation protocol: Yes   Plan:  Continue IV Heparin at 1000 units/hr. Repeat Heparin level in 6 hours to confirm.  Daily Heparin level and CBC while on therapy.  Follow-up for surgical plans for effusion and need to hold heparin.   Sloan Leiter, PharmD, BCPS, BCCCP Clinical Pharmacist Please refer to [facility] for [place] numbers 04/22/2019,11:36 AM

## 2019-04-22 NOTE — Progress Notes (Addendum)
Progress Note  Patient Name: [name] Date of Encounter: 04/22/2019  Primary Cardiologist: Taylor Grooms, MD   Subjective   No recurrent pain overnight. Worse with laying flat and bending down. No dyspnea or palpitations.   Inpatient Medications    Scheduled Meds:  aspirin  81 mg Oral Daily   atorvastatin  80 mg Oral q1800   metoprolol tartrate  12.5 mg Oral BID   ondansetron (ZOFRAN) IV  4 mg Intravenous NOW   sodium chloride flush  3 mL Intravenous Once   Continuous Infusions:  heparin 1,000 Units/hr (04/22/19 0246)   PRN Meds: acetaminophen, loratadine, morphine injection, nitroGLYCERIN, ondansetron (ZOFRAN) IV   Vital Signs    Vitals:   04/22/19 0515 04/22/19 0530 04/22/19 0609 04/22/19 0718  BP: 127/80 112/74 (!) 142/83 122/75  Pulse: 88 92 95 92  Resp: 20 17 14 16   Temp:   98 F (36.7 C) 98 F (36.7 C)  TempSrc:   Oral Oral  SpO2: 98% 97% 100% 95%  Weight:      Height:        Intake/Output Summary (Last 24 hours) at 04/22/2019 0827 Last data filed at 04/22/2019 0600 Gross per 24 hour  Intake 332.23 ml  Output --  Net 332.23 ml   Last 3 Weights 04/21/2019 04/13/2019 04/12/2019  Weight (lbs) 182 lb 184 lb 4.9 oz 185 lb 13.6 oz  Weight (kg) 82.555 kg 83.6 kg 84.3 kg      Telemetry    Sinus rhythm at rate of 80-90s- Personally Reviewed  ECG    SR at rate of 90 bpm - Personally Reviewed  Physical Exam   GEN: No acute distress.   Neck: No JVD Cardiac: RRR, no murmurs, rubs, or gallops.  Respiratory: Clear to auscultation bilaterally. GI: Soft, nontender, non-distended  MS: No edema; No deformity. Neuro:  Nonfocal  Psych: Normal affect   Labs    High Sensitivity Troponin:   Recent Labs  Lab 04/21/19 2208 04/22/19 0326 04/22/19 0502  TROPONINIHS 200*   19* 863* 1,561*      Chemistry Recent Labs  Lab 04/21/19 2208  NA 138  K 3.8  CL 107  CO2 22  GLUCOSE 109*  BUN 11  CREATININE 0.78  CALCIUM 8.9  GFRNONAA >60   GFRAA >60  ANIONGAP 9     Hematology Recent Labs  Lab 04/21/19 2208 04/22/19 0326  WBC 19.3* 19.9*  RBC 3.37* 3.46*  HGB 10.4* 10.6*  HCT 32.2* 34.1*  MCV 95.5 98.6  MCH 30.9 30.6  MCHC 32.3 31.1  RDW 13.4 13.5  PLT 486* 497*    Radiology    Dg Chest 2 View  Result Date: 04/21/2019 CLINICAL DATA:  Chest pain status post CABG EXAM: CHEST - 2 VIEW COMPARISON:  Chest radiograph 04/11/2019 FINDINGS: Monitoring leads overlie the patient. Stable cardiac and mediastinal contours status post median sternotomy. Low lung volumes. Slight interval increase in retrocardiac consolidation. Small left pleural effusion. No pneumothorax. Regional skeleton is unremarkable. IMPRESSION: Small left pleural effusion with mild interval increase in retrocardiac consolidation which may represent atelectasis. Infection not excluded. Electronically Signed   By: [name] M.D.   On: 04/21/2019 22:24   Ct Angio Chest Pe W And/or Wo Contrast  Result Date: 04/22/2019 CLINICAL DATA:  Chest pain EXAM: CT ANGIOGRAPHY CHEST WITH CONTRAST TECHNIQUE: Multidetector CT imaging of the chest was performed using the standard protocol during bolus administration of intravenous contrast. Multiplanar CT image reconstructions and MIPs  were obtained to evaluate the vascular anatomy. CONTRAST:  37mL OMNIPAQUE IOHEXOL 350 MG/ML SOLN COMPARISON:  Radiograph 04/21/2019 FINDINGS: Cardiovascular: Satisfactory opacification of the pulmonary arteries to the segmental level. No evidence of pulmonary embolism. Normal heart size. Small pericardial effusion. Coronary artery calcification. Nonaneurysmal aorta. No dissection is seen. Post CABG changes. Mediastinum/Nodes: Midline trachea. No thyroid mass. Mild haziness within the anterior mediastinum, could be secondary to postop change and small amount of superior pericardial fluid. No significantly enlarged lymph nodes. Esophagus is within normal limits. Lungs/Pleura: Moderate left pleural  effusion. Partial atelectasis in the left lower lobe versus pneumonia. No pneumothorax. Upper Abdomen: Cyst within the left hepatic lobe. No acute abnormality Musculoskeletal: Recent sternotomy.  No acute osseous abnormality. Review of the MIP images confirms the above findings. IMPRESSION: 1. Negative for acute pulmonary embolus or aortic dissection. 2. Moderate left pleural effusion with probable passive atelectasis at the left lower lobe 3. Recent sternotomy. Mild fluid and soft tissue stranding in the anterior mediastinum is presumably resolving operative change. Electronically Signed   By: [name] M.D.   On: 04/22/2019 00:47    Cardiac Studies   Pending echo  Patient Profile     53 y.o. female with coronary artery disease and recent two-vessel CABG with a LIMA to her LAD and vein graft to her diagonal done in June 2020. She also had PCI to proximal LAD in March 2020.  She is admitted here after an episode of back pain, some chest discomfort, with radiation up to the jaw and neck.   Assessment & Plan    1. NSTEMI - Her episode this time different than prior angina when she stenting in March. She took 4 SL nitro prior to ER arrival without any improvement.  -EKG without acute ischemic changes. HS-Troponin tending up 200>>869>>1561.  - Chest CT does show a significant left-sided pleural effusion with associated lung consolidation suggestive of infection or atelectasis.  She does have a significant leukocytosis but no fever lactic acid is normal.  - Her pain sounds like pleuritic and positional. Suspects pericarditis rather than angina.  - Echo being done. Will review plan with MD.  - Continue ASA (discharged on ASA 81mg ) , statin, BB and heparin.    For questions or updates, please contact [name] Please consult [email or web address] for contact info under        Signed, Taylor Kail, PA  04/22/2019, 8:27 AM    Agree with note by Taylor Lis PA-C  [name] is 2 weeks  postop CABG x2 with a LIMA to her LAD and a vein to diagonal branch.  She had normal LV function preoperatively.  She was discharged home 10 days ago and had done well until yesterday when she developed pain in her back and shoulders.  This was different than her heart attack pain.  It did not improve with sublingual nitroglycerin.  She was admitted to the hospital.  Her enzymes rose to 800 and then 1500.  Her EKG shows no acute changes.  She was given IV morphine with resolution of her chest pain.  Her exam is benign.  She does have a left pleural effusion and is going down for thoracentesis this morning.  A chest CT was negative for PE or aortic dissection.  Her exam otherwise is benign except for diminished breath sounds at the left base.  I suspect this is related to postop pericarditis rather than ACS.  2D echo is pending and was done this morning.  We  will treat with ibuprofen and colchicine for the time being.  Lorretta Harp, M.D., [place], [facility], [place] [address] [place], [place]  [address]  [phone] 04/22/2019 9:30 AM

## 2019-04-22 NOTE — Progress Notes (Addendum)
0845 Bedside shift report. Pt resting in bed. C/O generalized pain post CABG of 2/10. Safety intact, WCTM  0930 Pt assessed, see flow sheet.   0945 Pt down to IR via bed.   1030 Pt back from IR unable to perform thora, not enough fluid.   35 MD paged for diet orders and possible DC orders. Awaiting callback. New orders received, updated patient.  1500 Patient's dgt [name] called and updated with POC [phone]).

## 2019-04-22 NOTE — H&P (Addendum)
History and Physical    HONEY ZAKARIAN XFG:[id number] DOB: Jul 14, 1966 DOA: 04/21/2019  PCP: [name], MD Patient coming from: Home  Chief Complaint: Chest pain  HPI: [name] is a 53 y.o. female with medical history significant of MI in March 2020 status post LHC showing LAD stenosis treated with PCI and there was some residual ostial LAD stenosis.  Patient initially did well but then had recurrence of chest pain and underwent repeat catheterization which showed progression of her ostial LAD disease and patent stent.  She was evaluated by cardiothoracic surgery and subsequently underwent CABG on 04/08/2019.  She is now presenting to the hospital for evaluation of chest pain.  Patient took 2 nitro and 324 mg aspirin prior to EMS arrival.  EMS gave 4 nitro, 10 mg morphine, and a 500 cc fluid bolus.  Patient states today as she was getting ready to go for a walk she felt pain between her shoulder blades and then acute onset substernal and right-sided chest pressure.  The pain radiated to to the right side of her jaw.  She became drenched in sweat.  Her family gave her 2 nitroglycerin at home which did not help.  States EMS gave her another 4 doses of nitroglycerin which again did not help.  Her chest pain finally improved after she received morphine in the ED.  While I was talking to the patient, she reported feeling nauseous and had an episode of emesis.  Patient states chest pain at home was very severe but currently she is only having mild 3 out of 10 intensity chest discomfort.  Denies any abdominal pain.  No additional history could be obtained from her at this time.  ED Course: Hemodynamically stable.  White count 19.3.  Hemoglobin 10.4, no significant change since labs done 10 days ago.  High-sensitivity troponin 19.  EKG with new T wave inversions in V2 and V3.  Lactic acid pending.  Blood culture x2 pending.  COVID-19 rapid test pending.  Chest x-ray showing small left pleural effusion  with mild interval increase in retrocardiac consolidation which may represent atelectasis, infection not excluded. Received Vanc, cefepime, and morphine.  ED provider discussed the case with Dr. Ulysees Barns from cardiology who did not feel the patient's presentation was secondary to ACS.  He will see the patient tonight.  Review of Systems:  All systems reviewed and apart from history of presenting illness, are negative.  Past Medical History:  Diagnosis Date   Acute non-ST elevation myocardial infarction (NSTEMI) ([place]) 12/16/2018   CAD S/P DES PCI -pLAD 12/16/2018   Cath 12/15/2017: Successful proximal LAD synergy 3.0 x 15 DES postdilated to 3.25 mm reducing the stenosis to 0% ; ? 60-70% ostial LAD noted on some images.   PONV (postoperative nausea and vomiting)     Past Surgical History:  Procedure Laterality Date   CESAREAN SECTION     CORONARY ARTERY BYPASS GRAFT N/A 04/08/2019   Procedure: CORONARY ARTERY BYPASS GRAFTING (CABG) x two , using left internal mammary artery and right leg greater saphenous vein harvested endoscopically;  Surgeon: Ivin Poot, MD;  Location: [facility];  Service: Open Heart Surgery;  Laterality: N/A;   CORONARY/GRAFT ACUTE MI REVASCULARIZATION N/A 12/16/2018   Procedure: Coronary/Graft Acute MI Revascularization;  Surgeon: Belva Crome, MD;  Location: [place] [facility];  Service: Cardiovascular;  Laterality: N/A;   IUD REMOVAL N/A 05/16/2013   Procedure: INTRAUTERINE DEVICE (IUD) REMOVAL;  Surgeon: Darlyn Chamber, MD;  Location: [facility];  Service: Gynecology;  Laterality: N/A;   LAPAROSCOPIC TUBAL LIGATION Bilateral 05/16/2013   Procedure: LAPAROSCOPIC TUBAL LIGATION;  Surgeon: Darlyn Chamber, MD;  Location: [place] [facility];  Service: Gynecology;  Laterality: Bilateral;   LEFT HEART CATH AND CORONARY ANGIOGRAPHY N/A 12/16/2018   Procedure: LEFT HEART CATH AND CORONARY ANGIOGRAPHY;  Surgeon: Belva Crome, MD;  Location: [place] [facility];  Service: Cardiovascular;   Laterality: N/A;   LEFT HEART CATH AND CORONARY ANGIOGRAPHY N/A 03/26/2019   Procedure: LEFT HEART CATH AND CORONARY ANGIOGRAPHY;  Surgeon: Belva Crome, MD;  Location: [place] [facility];  Service: Cardiovascular;  Laterality: N/A;   TEE WITHOUT CARDIOVERSION N/A 04/08/2019   Procedure: TRANSESOPHAGEAL ECHOCARDIOGRAM (TEE);  Surgeon: Prescott Gum, Collier Salina, MD;  Location: [facility];  Service: Open Heart Surgery;  Laterality: N/A;     reports that she has never smoked. She has never used smokeless tobacco. She reports current alcohol use. She reports that she does not use drugs.  Allergies  Allergen Reactions   Tramadol     Made her feel very strange and groggy   Tetracycline Rash    Family History  Problem Relation Age of Onset   Heart disease Other    Colon cancer Other    Heart attack Other    Colon cancer Father    High blood pressure Mother    Heart attack Mother        2 stents   CAD Mother    Hyperlipidemia Mother    Hyperlipidemia Sister    Heart failure Maternal Grandmother    Heart attack Maternal Grandfather        deceased 30   Cancer Paternal Grandfather     Prior to Admission medications   Medication Sig Start Date End Date Taking? Authorizing Provider  acetaminophen (TYLENOL) 325 MG tablet Take 650 mg by mouth every 6 (six) hours as needed for mild pain.    Yes [provider]  aspirin 81 MG chewable tablet Chew 1 tablet (81 mg total) by mouth daily. 12/19/18  Yes Belva Crome, MD  atorvastatin (LIPITOR) 80 MG tablet Take 1 tablet (80 mg total) by mouth daily at 6 PM. 01/01/19  Yes Burtis Junes, NP  loratadine (CLARITIN) 10 MG tablet Take 10 mg by mouth daily as needed for allergies.   Yes [provider]  metoprolol tartrate (LOPRESSOR) 25 MG tablet Take 0.5 tablets (12.5 mg total) by mouth 2 (two) times daily for 30 days. 04/13/19 05/13/19 Yes Roddenberry, Arlis Porta, PA-C  nitroGLYCERIN (NITROSTAT) 0.4 MG SL tablet Place 1 tablet (0.4 mg  total) under the tongue every 5 (five) minutes as needed for chest pain. 12/18/18  Yes Belva Crome, MD  oxyCODONE-acetaminophen (PERCOCET/ROXICET) 5-325 MG tablet Take 1-2 tablets by mouth every 4 (four) hours as needed for moderate pain or severe pain.   Yes [provider]    Physical Exam: Vitals:   04/22/19 0215 04/22/19 0230 04/22/19 0245 04/22/19 0345  BP: 121/73 109/75 127/83 111/83  Pulse: 92 97 99   Resp: 19 18 13 19   Temp:      TempSrc:      SpO2: 97% 95% 100%   Weight:      Height:        Physical Exam  Constitutional: She is oriented to person, place, and time. She appears well-developed and well-nourished.  HENT:  Head: Normocephalic.  Mouth/Throat: Oropharynx is clear and moist.  Eyes: Right eye exhibits no  discharge. Left eye exhibits no discharge.  Neck: Neck supple.  Cardiovascular: Normal rate, regular rhythm and intact distal pulses.  Pulmonary/Chest: Effort normal. She has no wheezes.  Decreased breath sounds at the left lung base  Abdominal: Soft. Bowel sounds are normal. She exhibits no distension. There is no abdominal tenderness. There is no rebound and no guarding.  Musculoskeletal:        General: No edema.  Neurological: She is alert and oriented to person, place, and time.  Skin: Skin is warm and dry. She is not diaphoretic.     Labs on Admission: I have personally reviewed following labs and imaging studies  CBC: Recent Labs  Lab 04/21/19 2208 04/22/19 0326  WBC 19.3* 19.9*  NEUTROABS  --  15.0*  HGB 10.4* 10.6*  HCT 32.2* 34.1*  MCV 95.5 98.6  PLT 486* 268*   Basic Metabolic Panel: Recent Labs  Lab 04/21/19 2208  NA 138  K 3.8  CL 107  CO2 22  GLUCOSE 109*  BUN 11  CREATININE 0.78  CALCIUM 8.9   GFR: Estimated Creatinine Clearance: 78.4 mL/min (by C-G formula based on SCr of 0.78 mg/dL). Liver Function Tests: No results for input(s): AST, ALT, ALKPHOS, BILITOT, PROT, ALBUMIN in the last 168 hours. No results for  input(s): LIPASE, AMYLASE in the last 168 hours. No results for input(s): AMMONIA in the last 168 hours. Coagulation Profile: No results for input(s): INR, PROTIME in the last 168 hours. Cardiac Enzymes: No results for input(s): CKTOTAL, CKMB, CKMBINDEX, TROPONINI in the last 168 hours. BNP (last 3 results) No results for input(s): PROBNP in the last 8760 hours. HbA1C: No results for input(s): HGBA1C in the last 72 hours. CBG: Recent Labs  Lab 04/21/19 2353  GLUCAP 120*   Lipid Profile: No results for input(s): CHOL, HDL, LDLCALC, TRIG, CHOLHDL, LDLDIRECT in the last 72 hours. Thyroid Function Tests: No results for input(s): TSH, T4TOTAL, FREET4, T3FREE, THYROIDAB in the last 72 hours. Anemia Panel: No results for input(s): VITAMINB12, FOLATE, FERRITIN, TIBC, IRON, RETICCTPCT in the last 72 hours. Urine analysis:    Component Value Date/Time   COLORURINE YELLOW 04/04/2019 1152   APPEARANCEUR HAZY (A) 04/04/2019 1152   LABSPEC 1.013 04/04/2019 1152   PHURINE 6.0 04/04/2019 1152   GLUCOSEU NEGATIVE 04/04/2019 1152   HGBUR LARGE (A) 04/04/2019 1152   BILIRUBINUR NEGATIVE 04/04/2019 1152   [place] 04/04/2019 1152   PROTEINUR NEGATIVE 04/04/2019 1152   NITRITE NEGATIVE 04/04/2019 1152   LEUKOCYTESUR SMALL (A) 04/04/2019 1152    Radiological Exams on Admission: Dg Chest 2 View  Result Date: 04/21/2019 CLINICAL DATA:  Chest pain status post CABG EXAM: CHEST - 2 VIEW COMPARISON:  Chest radiograph 04/11/2019 FINDINGS: Monitoring leads overlie the patient. Stable cardiac and mediastinal contours status post median sternotomy. Low lung volumes. Slight interval increase in retrocardiac consolidation. Small left pleural effusion. No pneumothorax. Regional skeleton is unremarkable. IMPRESSION: Small left pleural effusion with mild interval increase in retrocardiac consolidation which may represent atelectasis. Infection not excluded. Electronically Signed   By: Lovey Newcomer M.D.    On: 04/21/2019 22:24   Ct Angio Chest Pe W And/or Wo Contrast  Result Date: 04/22/2019 CLINICAL DATA:  Chest pain EXAM: CT ANGIOGRAPHY CHEST WITH CONTRAST TECHNIQUE: Multidetector CT imaging of the chest was performed using the standard protocol during bolus administration of intravenous contrast. Multiplanar CT image reconstructions and MIPs were obtained to evaluate the vascular anatomy. CONTRAST:  46mL OMNIPAQUE IOHEXOL 350 MG/ML SOLN COMPARISON:  Radiograph 04/21/2019 FINDINGS: Cardiovascular: Satisfactory opacification of the pulmonary arteries to the segmental level. No evidence of pulmonary embolism. Normal heart size. Small pericardial effusion. Coronary artery calcification. Nonaneurysmal aorta. No dissection is seen. Post CABG changes. Mediastinum/Nodes: Midline trachea. No thyroid mass. Mild haziness within the anterior mediastinum, could be secondary to postop change and small amount of superior pericardial fluid. No significantly enlarged lymph nodes. Esophagus is within normal limits. Lungs/Pleura: Moderate left pleural effusion. Partial atelectasis in the left lower lobe versus pneumonia. No pneumothorax. Upper Abdomen: Cyst within the left hepatic lobe. No acute abnormality Musculoskeletal: Recent sternotomy.  No acute osseous abnormality. Review of the MIP images confirms the above findings. IMPRESSION: 1. Negative for acute pulmonary embolus or aortic dissection. 2. Moderate left pleural effusion with probable passive atelectasis at the left lower lobe 3. Recent sternotomy. Mild fluid and soft tissue stranding in the anterior mediastinum is presumably resolving operative change. Electronically Signed   By: Donavan Foil M.D.   On: 04/22/2019 00:47    EKG: Independently reviewed.  Sinus rhythm, new T wave inversions in V2 and V3 compared to prior EKG from April 09, 2019.  Assessment/Plan Principal Problem:   Chest pain Active Problems:   CAD S/P DES PCI -pLAD   S/P CABG x 2   Chest  pain History of MI status post PCI in March 2020 and recent CABG on April 08, 2019 Differentials include ACS, pleuritis, or pericarditis.  Hemodynamically stable.  Afebrile.  White count 19.3. High-sensitivity troponin 19 >200.  EKG with new T wave inversions in V2 and V3.  CT angiogram negative for acute PE or aortic dissection.  Showing moderate left pleural effusion with probable passive atelectasis at the left lower lobe.  CT also showing mild fluid and soft tissue stranding in the anterior mediastinum which is thought to be resolving operative change after recent sternotomy.  Postop chest x-ray done 04/11/2019 was showing small bilateral pleural effusions.  Patient had an episode of emesis while I was talking to her.  Denied any abdominal pain and abdominal exam benign.  Stated her chest pain has improved since she has been in the ED.  Repeat EKG showing resolution of T wave inversions seen previously and appears improved.  Repeat troponin pending. -Cardiology has seen the patient and feel her presentation is secondary to possible pleuritis or pericarditis.  Although at this time agree with IV heparin and medical treatment for ACS. -Cardiac monitoring -Patient took full dose aspirin at home prior to EMS arrival -Start IV heparin -Continue aspirin, beta-blocker, and high intensity statin -Trend troponin -Repeat EKG in a.m. and PRN recurrence of chest pain -Echocardiogram -Received vancomycin and cefepime in the ED. Procalcitonin <0.10. -Repeat CBC with differential in a.m. -IV morphine PRN pain -Zofran PRN -Sublingual nitro as needed -Currently not hypoxic or tachypneic.  Consult IR in a.m. for thoracentesis. -Blood culture x2 pending  Addendum: Troponin checked at the time patient had emesis came back significantly elevated at 863.  Patient comfortable and reported improvement of her chest pain.  EKG appeared improved compared to tracing done at the time of initial presentation to the ED.   Discussed with cardiology again.  Plan is to continue trending troponin and continue medical management for ACS.  Addendum: Repeat troponin 1561.  Patient continues to be comfortable and reporting improvement of her chest pain.  Stat repeat EKG showing T wave inversions in V2 and V3 similar to EKG upon initial presentation to the ED.  Spoke to Dr. Debara Pickett  and cardiology PA Ms. Rob Hickman, cardiology will see the patient.   DVT prophylaxis: Lovenox Code Status: Full code Family Communication: No family available at this time. Disposition Plan: Anticipate discharge after clinical improvement. Consults called: Cardiology Admission status: It is my clinical opinion that referral for OBSERVATION is reasonable and necessary in this patient based on the above information provided. The aforementioned taken together are felt to place the patient at high risk for further clinical deterioration. However it is anticipated that the patient may be medically stable for discharge from the hospital within 24 to 48 hours.  The medical decision making on this patient was of high complexity and the patient is at high risk for clinical deterioration, therefore this is a level 3 visit.   Shela Leff MD [facility] Pager [phone]  If 7PM-7AM, please contact night-coverage [email or web address] Password [email or web address]  04/22/2019, 4:16 AM

## 2019-04-22 NOTE — Consult Note (Addendum)
Advanced Heart Failure Team Consult Note   Primary Physician: [name], MD PCP-Cardiologist:  Sinclair Grooms, MD  Reason for Consultation: Chest pain, elevated troponin  HPI:    [name] is seen today for evaluation of chest pain with an elevated troponin at the request of emergency room.   Ms. Moe is a 53 year old woman with a history of an LAD NSTEMI in March 2020 which was treated with a drug-eluting stent.  She was also noted to have a borderline moderate ostial LAD stenosis at that time.  She had recurrence of chest pain in June and was cathed again which demonstrated some progression of the ostial LAD disease along with stenosis of the first diagonal which prompted CABG referral.  She underwent a two-vessel CABG with a LIMA to LAD and vein graft to the diagonal on June 22.  She has been recovering fairly well since then.  Today she was going for a walk with her husband when she started to feel some pain between her shoulder blades in her back along with some associated radiation to the right-sided jaw and center of her chest.  This was associated with dyspnea and some sweating.  She received some nitroglycerin at home which did not really help.  By the time of my interview she was feeling better and felt like the chest discomfort was different from when she had her heart attack back in March or the chest pain she had when she came back into the hospital in June.  She does note that the chest discomfort is worse with taking a deep breath or laying back.  The chest discomfort improves with leaning forward.  She denies fevers, chills, or significant cough at this time.  He says that after the CABG she did have some cough and atelectasis but that had improved with use of incentive spirometry. Overall she does report that she has been extremely tired at home for the past few days  Review of Systems: [y] = yes, [ ]  = no   . General: Weight gain [ ] ; Weight loss [ ] ;  Anorexia [ ] ; Fatigue [yes]; Fever [ ] ; Chills [ ] ; Weakness [ ]   . Cardiac: Chest pain/pressure [yes]; Resting SOB [ ] ; Exertional SOB [yes]; Orthopnea [ ] ; Pedal Edema [ ] ; Palpitations [ ] ; Syncope [ ] ; Presyncope [ ] ; Paroxysmal nocturnal dyspnea[ ]   . Pulmonary: Cough [ ] ; Wheezing[ ] ; Hemoptysis[ ] ; Sputum [ ] ; Snoring [ ]   . GI: Vomiting[ ] ; Dysphagia[ ] ; Melena[ ] ; Hematochezia [ ] ; Heartburn[ ] ; Abdominal pain [ ] ; Constipation [ ] ; Diarrhea [ ] ; BRBPR [ ]   . GU: Hematuria[ ] ; Dysuria [ ] ; Nocturia[ ]   . Vascular: Pain in legs with walking [ ] ; Pain in feet with lying flat [ ] ; Non-healing sores [ ] ; Stroke [ ] ; TIA [ ] ; Slurred speech [ ] ;  . Neuro: Headaches[ ] ; Vertigo[ ] ; Seizures[ ] ; Paresthesias[ ] ;Blurred vision [ ] ; Diplopia [ ] ; Vision changes [ ]   . Ortho/Skin: Arthritis [ ] ; Joint pain [ ] ; Muscle pain [ ] ; Joint swelling [ ] ; Back Pain [yes]; Rash [ ]   . Psych: Depression[ ] ; Anxiety[ ]   . Heme: Bleeding problems [ ] ; Clotting disorders [ ] ; Anemia [ ]   . Endocrine: Diabetes [ ] ; Thyroid dysfunction[ ]   Home Medications Prior to Admission medications   Medication Sig Start Date End Date Taking? Authorizing Provider  acetaminophen (TYLENOL) 325 MG tablet Take 650 mg by mouth  every 6 (six) hours as needed for mild pain.    Yes [provider]  aspirin 81 MG chewable tablet Chew 1 tablet (81 mg total) by mouth daily. 12/19/18  Yes Belva Crome, MD  atorvastatin (LIPITOR) 80 MG tablet Take 1 tablet (80 mg total) by mouth daily at 6 PM. 01/01/19  Yes Burtis Junes, NP  loratadine (CLARITIN) 10 MG tablet Take 10 mg by mouth daily as needed for allergies.   Yes [provider]  metoprolol tartrate (LOPRESSOR) 25 MG tablet Take 0.5 tablets (12.5 mg total) by mouth 2 (two) times daily for 30 days. 04/13/19 05/13/19 Yes Roddenberry, Arlis Porta, PA-C  nitroGLYCERIN (NITROSTAT) 0.4 MG SL tablet Place 1 tablet (0.4 mg total) under the tongue every 5 (five) minutes as needed  for chest pain. 12/18/18  Yes Belva Crome, MD  oxyCODONE-acetaminophen (PERCOCET/ROXICET) 5-325 MG tablet Take 1-2 tablets by mouth every 4 (four) hours as needed for moderate pain or severe pain.   Yes [provider]    Past Medical History: Past Medical History:  Diagnosis Date  . Acute non-ST elevation myocardial infarction (NSTEMI) ([place]) 12/16/2018  . CAD S/P DES PCI -pLAD 12/16/2018   Cath 12/15/2017: Successful proximal LAD synergy 3.0 x 15 DES postdilated to 3.25 mm reducing the stenosis to 0% ; ? 60-70% ostial LAD noted on some images.  Marland Kitchen PONV (postoperative nausea and vomiting)     Past Surgical History: Past Surgical History:  Procedure Laterality Date  . CESAREAN SECTION    . CORONARY ARTERY BYPASS GRAFT N/A 04/08/2019   Procedure: CORONARY ARTERY BYPASS GRAFTING (CABG) x two , using left internal mammary artery and right leg greater saphenous vein harvested endoscopically;  Surgeon: Ivin Poot, MD;  Location: [place];  Service: Open Heart Surgery;  Laterality: N/A;  . CORONARY/GRAFT ACUTE MI REVASCULARIZATION N/A 12/16/2018   Procedure: Coronary/Graft Acute MI Revascularization;  Surgeon: Belva Crome, MD;  Location: [place] [facility];  Service: Cardiovascular;  Laterality: N/A;  . IUD REMOVAL N/A 05/16/2013   Procedure: INTRAUTERINE DEVICE (IUD) REMOVAL;  Surgeon: Darlyn Chamber, MD;  Location: [facility];  Service: Gynecology;  Laterality: N/A;  . LAPAROSCOPIC TUBAL LIGATION Bilateral 05/16/2013   Procedure: LAPAROSCOPIC TUBAL LIGATION;  Surgeon: Darlyn Chamber, MD;  Location: [facility];  Service: Gynecology;  Laterality: Bilateral;  . LEFT HEART CATH AND CORONARY ANGIOGRAPHY N/A 12/16/2018   Procedure: LEFT HEART CATH AND CORONARY ANGIOGRAPHY;  Surgeon: Belva Crome, MD;  Location: [place];  Service: Cardiovascular;  Laterality: N/A;  . LEFT HEART CATH AND CORONARY ANGIOGRAPHY N/A 03/26/2019   Procedure: LEFT HEART CATH AND CORONARY ANGIOGRAPHY;  Surgeon: Belva Crome, MD;  Location: [facility] [facility];  Service: Cardiovascular;  Laterality: N/A;  . TEE WITHOUT CARDIOVERSION N/A 04/08/2019   Procedure: TRANSESOPHAGEAL ECHOCARDIOGRAM (TEE);  Surgeon: Prescott Gum, Collier Salina, MD;  Location: [place];  Service: Open Heart Surgery;  Laterality: N/A;    Family History: Family History  Problem Relation Age of Onset  . Heart disease Other   . Colon cancer Other   . Heart attack Other   . Colon cancer Father   . High blood pressure Mother   . Heart attack Mother        2 stents  . CAD Mother   . Hyperlipidemia Mother   . Hyperlipidemia Sister   . Heart failure Maternal Grandmother   . Heart attack Maternal Grandfather  deceased 45  . Cancer Paternal Grandfather     Social History: Social History   Socioeconomic History  . Marital status: Married    Spouse name: Not on file  . Number of children: Not on file  . Years of education: Not on file  . Highest education level: Not on file  Occupational History  . Not on file  Social Needs  . Financial resource strain: Not on file  . Food insecurity    Worry: Not on file    Inability: Not on file  . Transportation needs    Medical: Not on file    Non-medical: Not on file  Tobacco Use  . Smoking status: Never Smoker  . Smokeless tobacco: Never Used  Substance and Sexual Activity  . Alcohol use: Yes    Comment:  once a month  . Drug use: No  . Sexual activity: Yes  Lifestyle  . Physical activity    Days per week: Not on file    Minutes per session: Not on file  . Stress: Not on file  Relationships  . Social Herbalist on phone: Not on file    Gets together: Not on file    Attends religious service: Not on file    Active member of club or organization: Not on file    Attends meetings of clubs or organizations: Not on file    Relationship status: Not on file  Other Topics Concern  . Not on file  Social History Narrative  . Not on file    Allergies:  Allergies   Allergen Reactions  . Tramadol     Made her feel very strange and groggy  . Tetracycline Rash    Objective:    Vital Signs:   Temp:  [98.2 F (36.8 C)] 98.2 F (36.8 C) (07/05 2147) Pulse Rate:  [2-99] 99 (07/06 0245) Resp:  [13-19] 19 (07/06 0345) BP: (109-129)/(68-83) 111/83 (07/06 0345) SpO2:  [91 %-100 %] 100 % (07/06 0245) Weight:  [82.6 kg] 82.6 kg (07/05 2150)    Weight change: Filed Weights   04/21/19 2150  Weight: 82.6 kg    Intake/Output:   Intake/Output Summary (Last 24 hours) at 04/22/2019 0406 Last data filed at 04/22/2019 0243 Gross per 24 hour  Intake 300 ml  Output -  Net 300 ml      Physical Exam    General: Comfortable.  Able to speak in full sentences however does have to stop rarely to catch her breath HEENT: normal Neck: supple. JVP . Carotids 2+ bilat; no bruits. No lymphadenopathy or thyromegaly appreciated. Cor: PMI nondisplaced. Regular rate & rhythm. No rubs, gallops or murmurs.  Anterior chest wall is tender to touch with a well-healing midline scar that is clean dry and intact Lungs: Decreased breath sounds of the left lower lobe Abdomen: soft, nontender, nondistended. No hepatosplenomegaly. No bruits or masses. Good bowel sounds. Extremities: no cyanosis, clubbing, rash.  Trace bilateral edema of the ankles. Neuro: alert & orientedx3, cranial nerves grossly intact. moves all 4 extremities w/o difficulty. Affect pleasant  EKG    Normal sinus rhythm with nonspecific T wave abnormalities in the anteroseptal leads  Labs   Basic Metabolic Panel: Recent Labs  Lab 04/21/19 2208  NA 138  K 3.8  CL 107  CO2 22  GLUCOSE 109*  BUN 11  CREATININE 0.78  CALCIUM 8.9    Liver Function Tests: No results for input(s): AST, ALT, ALKPHOS, BILITOT, PROT, ALBUMIN in the  last 168 hours. No results for input(s): LIPASE, AMYLASE in the last 168 hours. No results for input(s): AMMONIA in the last 168 hours.  CBC: Recent Labs  Lab 04/21/19  2208 04/22/19 0326  WBC 19.3* 19.9*  NEUTROABS  --  15.0*  HGB 10.4* 10.6*  HCT 32.2* 34.1*  MCV 95.5 98.6  PLT 486* 497*    Cardiac Enzymes: No results for input(s): CKTOTAL, CKMB, CKMBINDEX, TROPONINI in the last 168 hours.  BNP: BNP (last 3 results) Recent Labs    12/16/18 0629  BNP 23.5    ProBNP (last 3 results) No results for input(s): PROBNP in the last 8760 hours.   CBG: Recent Labs  Lab 04/21/19 2353  GLUCAP 120*    Coagulation Studies: No results for input(s): LABPROT, INR in the last 72 hours.   Imaging   Dg Chest 2 View  Result Date: 04/21/2019 CLINICAL DATA:  Chest pain status post CABG EXAM: CHEST - 2 VIEW COMPARISON:  Chest radiograph 04/11/2019 FINDINGS: Monitoring leads overlie the patient. Stable cardiac and mediastinal contours status post median sternotomy. Low lung volumes. Slight interval increase in retrocardiac consolidation. Small left pleural effusion. No pneumothorax. Regional skeleton is unremarkable. IMPRESSION: Small left pleural effusion with mild interval increase in retrocardiac consolidation which may represent atelectasis. Infection not excluded. Electronically Signed   By: Lovey Newcomer M.D.   On: 04/21/2019 22:24   Ct Angio Chest Pe W And/or Wo Contrast  Result Date: 04/22/2019 CLINICAL DATA:  Chest pain EXAM: CT ANGIOGRAPHY CHEST WITH CONTRAST TECHNIQUE: Multidetector CT imaging of the chest was performed using the standard protocol during bolus administration of intravenous contrast. Multiplanar CT image reconstructions and MIPs were obtained to evaluate the vascular anatomy. CONTRAST:  2mL OMNIPAQUE IOHEXOL 350 MG/ML SOLN COMPARISON:  Radiograph 04/21/2019 FINDINGS: Cardiovascular: Satisfactory opacification of the pulmonary arteries to the segmental level. No evidence of pulmonary embolism. Normal heart size. Small pericardial effusion. Coronary artery calcification. Nonaneurysmal aorta. No dissection is seen. Post CABG changes.  Mediastinum/Nodes: Midline trachea. No thyroid mass. Mild haziness within the anterior mediastinum, could be secondary to postop change and small amount of superior pericardial fluid. No significantly enlarged lymph nodes. Esophagus is within normal limits. Lungs/Pleura: Moderate left pleural effusion. Partial atelectasis in the left lower lobe versus pneumonia. No pneumothorax. Upper Abdomen: Cyst within the left hepatic lobe. No acute abnormality Musculoskeletal: Recent sternotomy.  No acute osseous abnormality. Review of the MIP images confirms the above findings. IMPRESSION: 1. Negative for acute pulmonary embolus or aortic dissection. 2. Moderate left pleural effusion with probable passive atelectasis at the left lower lobe 3. Recent sternotomy. Mild fluid and soft tissue stranding in the anterior mediastinum is presumably resolving operative change. Electronically Signed   By: Donavan Foil M.D.   On: 04/22/2019 00:47      Medications:     Current Medications: . aspirin  81 mg Oral Daily  . atorvastatin  80 mg Oral q1800  . metoprolol tartrate  12.5 mg Oral BID  . ondansetron (ZOFRAN) IV  4 mg Intravenous NOW  . sodium chloride flush  3 mL Intravenous Once     Infusions: . heparin 1,000 Units/hr (04/22/19 0246)    Assessment/Plan  53 year old woman with coronary artery disease and recent two-vessel CABG with a LIMA to her LAD and vein graft to her diagonal done in June 2020. She also had PCI to proximal LAD in March 2020. She is admitted here tonight after an episode of back pain, some chest discomfort,  with radiation up to the jaw and neck.   The overall cause of her symptoms is unclear although coronary cause, CHF, pericarditis, pleurisy, pneumonia are possible.  She does have a mildly elevated troponin which increased from 19 to 200 although her EKG is not clearly ischemic.  Her recent CABG was uncomplicated and she seems to have done well since then.   There is a pleuritic and  positional component to the pain.  However again EKG does not demonstrate pericarditis.  Chest CT does show a significant left-sided pleural effusion with associated lung consolidation suggestive of infection or atelectasis.  She does have a significant leukocytosis but no fever lactic acid is normal.  She does meet septic criteria based on her heart rate and leukocytosis.  Blood cultures were drawn. She was given IV antibiotics.  May need to consider thoracentesis.  A reasonable approach for her known coronary disease would be to medically manage a possible acute coronary syndrome with aspirin, statin, beta-blocker and therapeutic heparin at this time and check a 2D echocardiogram.  Echo will evaluate for any wall motion abnormalities along with any pericardial effusion.  LVEF was 60% in 03/2019. Depending on clinical course and work-up may need to consider repeat coronary and bypass graft angiography.  Thank you for the consultation  Length of Stay: 0  Tressie Stalker, MD  Cardiology 04/22/2019, 4:06 AM

## 2019-04-23 DIAGNOSIS — Z951 Presence of aortocoronary bypass graft: Secondary | ICD-10-CM

## 2019-04-23 MED ORDER — PANTOPRAZOLE SODIUM 40 MG PO TBEC: 40.0000 mg | DELAYED_RELEASE_TABLET | Freq: Every day | ORAL | 0 refills | Status: DC

## 2019-04-23 MED ORDER — COLCHICINE 0.6 MG PO TABS: 0.6000 mg | ORAL_TABLET | Freq: Two times a day (BID) | ORAL | 0 refills | Status: DC

## 2019-04-23 MED ORDER — IBUPROFEN 600 MG PO TABS: 600.0000 mg | ORAL_TABLET | Freq: Three times a day (TID) | ORAL | 0 refills | Status: DC

## 2019-04-23 MED FILL — IBUPROFEN 600 MG TABLET: 600 | 7 days supply | Qty: 21 | Fill #0

## 2019-04-23 MED FILL — PANTOPRAZOLE SOD DR 40 MG T: 40 | 30 days supply | Qty: 30 | Fill #0

## 2019-04-23 MED FILL — COLCHICINE 0.6 MG TABS: 0.6 | 90 days supply | Qty: 180 | Fill #0

## 2019-04-23 NOTE — Progress Notes (Addendum)
Progress Note  Patient Name: [name] Date of Encounter: 04/23/2019  Primary Cardiologist: Sinclair Grooms, MD  Subjective   Back and chest pain resolved. Mild chest soreness from surgery. Walking in room without significant issue. Now able to lay flat without reproducible symptoms.   Inpatient Medications    Scheduled Meds: . aspirin  81 mg Oral Daily  . atorvastatin  80 mg Oral q1800  . colchicine  0.6 mg Oral BID  . ibuprofen  600 mg Oral TID  . metoprolol tartrate  12.5 mg Oral BID  . pantoprazole  40 mg Oral Daily  . sodium chloride flush  3 mL Intravenous Once   Continuous Infusions:  PRN Meds: acetaminophen, loratadine, morphine injection, nitroGLYCERIN, ondansetron (ZOFRAN) IV   Vital Signs    Vitals:   04/22/19 1954 04/22/19 2330 04/22/19 2347 04/23/19 0553  BP: 106/76  119/69 115/78  Pulse: 81 86 81 96  Resp: 16 17 18 20   Temp: 97.8 F (36.6 C)  99 F (37.2 C) 98 F (36.7 C)  TempSrc: Oral  Oral Oral  SpO2: 98% 97% 99% 96%  Weight:    80.9 kg  Height:        Intake/Output Summary (Last 24 hours) at 04/23/2019 0823 Last data filed at 04/23/2019 0610 Gross per 24 hour  Intake 564 ml  Output 2800 ml  Net -2236 ml   Last 3 Weights 04/23/2019 04/21/2019 04/13/2019  Weight (lbs) 178 lb 6.4 oz 182 lb 184 lb 4.9 oz  Weight (kg) 80.922 kg 82.555 kg 83.6 kg      Telemetry    NSR at controlled rate of 80s - Personally Reviewed  ECG    NSR at rate of 83 bpm - Personally Reviewed  Physical Exam   GEN: No acute distress.   Neck: No JVD Cardiac: RRR, no murmurs, rubs, or gallops.  Respiratory: diminished breath sound at L base GI: Soft, nontender, non-distended  MS: No edema; No deformity. Neuro:  Nonfocal  Psych: Normal affect   Labs    High Sensitivity Troponin:   Recent Labs  Lab 04/21/19 2208 04/22/19 0326 04/22/19 0502  TROPONINIHS 200*  19* 863* 1,561*     Chemistry Recent Labs  Lab 04/21/19 2208  NA 138  K 3.8  CL 107   CO2 22  GLUCOSE 109*  BUN 11  CREATININE 0.78  CALCIUM 8.9  GFRNONAA >60  GFRAA >60  ANIONGAP 9     Hematology Recent Labs  Lab 04/21/19 2208 04/22/19 0326  WBC 19.3* 19.9*  RBC 3.37* 3.46*  HGB 10.4* 10.6*  HCT 32.2* 34.1*  MCV 95.5 98.6  MCH 30.9 30.6  MCHC 32.3 31.1  RDW 13.4 13.5  PLT 486* 497*    Radiology    Dg Chest 2 View  Result Date: 04/21/2019 CLINICAL DATA:  Chest pain status post CABG EXAM: CHEST - 2 VIEW COMPARISON:  Chest radiograph 04/11/2019 FINDINGS: Monitoring leads overlie the patient. Stable cardiac and mediastinal contours status post median sternotomy. Low lung volumes. Slight interval increase in retrocardiac consolidation. Small left pleural effusion. No pneumothorax. Regional skeleton is unremarkable. IMPRESSION: Small left pleural effusion with mild interval increase in retrocardiac consolidation which may represent atelectasis. Infection not excluded. Electronically Signed   By: Lovey Newcomer M.D.   On: 04/21/2019 22:24   Ct Angio Chest Pe W And/or Wo Contrast  Result Date: 04/22/2019 CLINICAL DATA:  Chest pain EXAM: CT ANGIOGRAPHY CHEST WITH CONTRAST TECHNIQUE: Multidetector CT imaging of  the chest was performed using the standard protocol during bolus administration of intravenous contrast. Multiplanar CT image reconstructions and MIPs were obtained to evaluate the vascular anatomy. CONTRAST:  33mL OMNIPAQUE IOHEXOL 350 MG/ML SOLN COMPARISON:  Radiograph 04/21/2019 FINDINGS: Cardiovascular: Satisfactory opacification of the pulmonary arteries to the segmental level. No evidence of pulmonary embolism. Normal heart size. Small pericardial effusion. Coronary artery calcification. Nonaneurysmal aorta. No dissection is seen. Post CABG changes. Mediastinum/Nodes: Midline trachea. No thyroid mass. Mild haziness within the anterior mediastinum, could be secondary to postop change and small amount of superior pericardial fluid. No significantly enlarged lymph  nodes. Esophagus is within normal limits. Lungs/Pleura: Moderate left pleural effusion. Partial atelectasis in the left lower lobe versus pneumonia. No pneumothorax. Upper Abdomen: Cyst within the left hepatic lobe. No acute abnormality Musculoskeletal: Recent sternotomy.  No acute osseous abnormality. Review of the MIP images confirms the above findings. IMPRESSION: 1. Negative for acute pulmonary embolus or aortic dissection. 2. Moderate left pleural effusion with probable passive atelectasis at the left lower lobe 3. Recent sternotomy. Mild fluid and soft tissue stranding in the anterior mediastinum is presumably resolving operative change. Electronically Signed   By: Donavan Foil M.D.   On: 04/22/2019 00:47   Ir US Chest  Result Date: 04/22/2019 CLINICAL DATA:  53 year old with left pleural effusion on recent CT. EXAM: CHEST ULTRASOUND COMPARISON:  Chest CT 03/23/2019 FINDINGS: Small amount of fluid along the posterior left lung base. Small percutaneous window and concern for flap of lung superficial to the fluid. IMPRESSION: Small left pleural effusion. Concern for a small amount of lung between the ribcage and the pleural fluid. Therefore, ultrasound-guided thoracentesis was not performed. Electronically Signed   By: Markus Daft M.D.   On: 04/22/2019 10:39    Cardiac Studies   Echo 04/22/2019 IMPRESSIONS    1. The left ventricle has normal systolic function with an ejection fraction of 60-65%. The cavity size was normal. Left ventricular diastolic parameters were normal. No evidence of left ventricular regional wall motion abnormalities.  2. Anterolateral wall incompletely visualized, but no gross wall motion abnormalities noted.  3. Moderate pleural effusion in the left lateral region.  4. Trivial pericardial effusion is present.  5. The aortic valve is tricuspid. No stenosis of the aortic valve.  6. The aortic root is normal in size and structure.  7. The interatrial septum was not  assessed.  Patient Profile     53 y.o. female with coronary artery disease and recent two-vessel CABG with a LIMA to her LAD and vein graft to her diagonal done in June 2020. She also had PCI to proximal LAD in March 2020.  She is admitted here after an episode of back pain,some chest discomfort,with radiation up to the jaw and neck.  Assessment & Plan    1. Acute pericarditis - Presented with symptoms different than recent angina. Not responsive to SL nitro.   -EKG without acute ischemic changes. HS-Troponin tended up 200>>869>>1561.  - Chest CT was negative for PE or aortic dissection - Her pain sounds like pleuritic and positional which consistent with pericarditis rather than ACS. - Started on Ibuprofen and colchicine with improved symptoms.  - Echo with preserved LV function without gross WM abnormality.  - No further cardiac work up. Ambulate in hallway, if no recurrent pain she can be discharge home on cardiac stand point.   2. Leukocytosis - She does have a significant leukocytosis but no fever lactic acid is normal.  - Recently treated  for UTI - Chest CT does show a significant left-sided pleural effusion with associated lung consolidation suggestive of infection or atelectasis. - Further work up per primary team  3. L pleural effusion  - Attempted L thoracentesis but visualized to have very small volume so procedure canceled.  4. CAD - Continue ASA (discharged on ASA 81mg  after CABG), statin and BB. Stopped heparin.    [facility] will sign off.   Medication Recommendations:  As summarized above (Ibprofen for 1-2 weeks and Colchicine x 3 months) Other recommendations (labs, testing, etc): None Follow up as an outpatient:  Next week   For questions or updates, please contact [place] Please consult [email or web address] for contact info under        Signed, Leanor Kail, PA  04/23/2019, 8:23 AM    Agree with note by Robbie Lis PA-C  Clinically  improved on ibuprofen and colchicine.  No further chest pain.  Will sign off.  Suspect this was postop pericarditis.  Lorretta Harp, M.D., [place], [facility], [place] [address] [place], [place]  [address]  [phone] 04/23/2019 9:10 AM

## 2019-04-23 NOTE — Discharge Summary (Signed)
Physician Discharge Summary  [name] HLK:[id number] DOB: 11-Oct-1966 DOA: 04/21/2019  PCP: [name], MD  Admit date: 04/21/2019 Discharge date: 04/23/2019  Admitted From: Home Disposition:  Home  Discharge Condition:Stable CODE STATUS:FULL Diet recommendation: Heart Healthy  Brief/Interim Summary: Patientis a 53 y.o.femalewith medical history significant ofMI in March 2020 status post LHC showing LAD stenosis treated with PCI, CABG on 04/08/2019 who presented to the emergency department with complaints of chest pain. On presentation she was hemodynamically stable.  She had leukocytosis.  Found to have elevated high-sensitivity troponin.  Chest CT showed left pleural effusion.  Cardiology consulted. She was initially started on heparin drip which has been discontinued now.  Cardiology felt that this is more due to pericarditis than acute coronary syndrome.  Started on ibuprofen and colchicine.Her echocardiogram showed normal left ventricular systolic function, no wall motion abnormalities, small pericardial effusion. Attempted for left-sided thoracentesis but visualized to have very small volume so procedure canceled. This morning she was hemodynamically stable.  Denies any chest pain. She is stable for discharge to home.  She will follow-up with cardiology as an outpatient.  Following problems were addressed during hospitalization:  Chest pain History of MI status post PCI in March 2020 and recent CABG on April 08, 2019 Differentials included ACS, pleuritis, or pericarditis.  Hemodynamically stable.  Afebrile.  White count 19.3. High-sensitivity troponin 19 >200.  EKG with new T wave inversions in V2 and V3.  CT angiogram negative for acute PE or aortic dissection.  Showing moderate left pleural effusion with probable passive atelectasis at the left lower lobe.  CT also showing mild fluid and soft tissue stranding in the anterior mediastinum which is thought to be resolving  operative change after recent sternotomy.  Postop chest x-ray done 04/11/2019 was showing small bilateral pleural effusions.  Cardiology consulted after admission.  Her chest pain improved during the hospitalization.  Her findings were more suggestive of post CABG pericarditis.  She has been started on ibuprofen and colchicine. Follow-up appointment cardiology has already been set up.  Discharge Diagnoses:  Principal Problem:   Chest pain Active Problems:   CAD S/P DES PCI -pLAD   S/P CABG x 2   NSTEMI (non-ST elevated myocardial infarction) (HCC)   Elevated troponin    Discharge Instructions  Discharge Instructions    Diet - low sodium heart healthy   Complete by: As directed    Discharge instructions   Complete by: As directed    1)Please take prescribed medications as instructed. 2)Follow up with cardiology as an outpatient.Appointment has already been scheduled.Do a CBC test during the follow  Up.   Increase activity slowly   Complete by: As directed      Allergies as of 04/23/2019      Reactions   Tramadol    Made her feel very strange and groggy   Tetracycline Rash      Medication List    STOP taking these medications   acetaminophen 325 MG tablet Commonly known as: TYLENOL     TAKE these medications   aspirin 81 MG chewable tablet Chew 1 tablet (81 mg total) by mouth daily.   atorvastatin 80 MG tablet Commonly known as: LIPITOR Take 1 tablet (80 mg total) by mouth daily at 6 PM.   colchicine 0.6 MG tablet Take 1 tablet (0.6 mg total) by mouth 2 (two) times daily.   ibuprofen 600 MG tablet Commonly known as: ADVIL Take 1 tablet (600 mg total) by mouth 3 (three) times  daily for 7 days.   loratadine 10 MG tablet Commonly known as: CLARITIN Take 10 mg by mouth daily as needed for allergies.   metoprolol tartrate 25 MG tablet Commonly known as: LOPRESSOR Take 0.5 tablets (12.5 mg total) by mouth 2 (two) times daily for 30 days.   nitroGLYCERIN 0.4 MG SL  tablet Commonly known as: NITROSTAT Place 1 tablet (0.4 mg total) under the tongue every 5 (five) minutes as needed for chest pain.   oxyCODONE-acetaminophen 5-325 MG tablet Commonly known as: PERCOCET/ROXICET Take 1-2 tablets by mouth every 4 (four) hours as needed for moderate pain or severe pain.   pantoprazole 40 MG tablet Commonly known as: PROTONIX Take 1 tablet (40 mg total) by mouth daily. Start taking on: April 24, 2019      Follow-up Information    Burtis Junes, NP. Go on 04/29/2019.   Specialties: Nurse Practitioner, Interventional Cardiology, Cardiology, Radiology Why: @11 :30am for hospital follow up  Contact information: [place] [place] [address] [phone]          Allergies  Allergen Reactions  . Tramadol     Made her feel very strange and groggy  . Tetracycline Rash    Consultations:  Cardiology   Procedures/Studies: Dg Chest 2 View  Result Date: 04/21/2019 CLINICAL DATA:  Chest pain status post CABG EXAM: CHEST - 2 VIEW COMPARISON:  Chest radiograph 04/11/2019 FINDINGS: Monitoring leads overlie the patient. Stable cardiac and mediastinal contours status post median sternotomy. Low lung volumes. Slight interval increase in retrocardiac consolidation. Small left pleural effusion. No pneumothorax. Regional skeleton is unremarkable. IMPRESSION: Small left pleural effusion with mild interval increase in retrocardiac consolidation which may represent atelectasis. Infection not excluded. Electronically Signed   By: Lovey Newcomer M.D.   On: 04/21/2019 22:24   Dg Chest 2 View  Result Date: 04/11/2019 CLINICAL DATA:  Sore chest, follow-up CABG EXAM: CHEST - 2 VIEW COMPARISON:  04/10/2019 FINDINGS: No significant change in a tiny, less than 5% left apical pneumothorax. Small bilateral pleural effusions with associated atelectasis or consolidation. No focal airspace opacity. Cardiomegaly status post median sternotomy. Right neck vascular  sheath. IMPRESSION: No significant change in a tiny, less than 5% left apical pneumothorax. Small bilateral pleural effusions with associated atelectasis or consolidation. No focal airspace opacity. Cardiomegaly status post median sternotomy. Right neck vascular sheath. Electronically Signed   By: Eddie Candle M.D.   On: 04/11/2019 10:21   Dg Chest 2 View  Result Date: 04/05/2019 CLINICAL DATA:  Pre-admission for CABG EXAM: CHEST - 2 VIEW COMPARISON:  12/16/2018 FINDINGS: Normal heart size and mediastinal contours. Coronary stent is present. No acute infiltrate or edema. No effusion or pneumothorax. No acute osseous findings. Remote L1 compression fracture. IMPRESSION: No active cardiopulmonary disease. Electronically Signed   By: Monte Fantasia M.D.   On: 04/05/2019 07:35   Ct Angio Chest Pe W And/or Wo Contrast  Result Date: 04/22/2019 CLINICAL DATA:  Chest pain EXAM: CT ANGIOGRAPHY CHEST WITH CONTRAST TECHNIQUE: Multidetector CT imaging of the chest was performed using the standard protocol during bolus administration of intravenous contrast. Multiplanar CT image reconstructions and MIPs were obtained to evaluate the vascular anatomy. CONTRAST:  24mL OMNIPAQUE IOHEXOL 350 MG/ML SOLN COMPARISON:  Radiograph 04/21/2019 FINDINGS: Cardiovascular: Satisfactory opacification of the pulmonary arteries to the segmental level. No evidence of pulmonary embolism. Normal heart size. Small pericardial effusion. Coronary artery calcification. Nonaneurysmal aorta. No dissection is seen. Post CABG changes. Mediastinum/Nodes: Midline trachea. No thyroid  mass. Mild haziness within the anterior mediastinum, could be secondary to postop change and small amount of superior pericardial fluid. No significantly enlarged lymph nodes. Esophagus is within normal limits. Lungs/Pleura: Moderate left pleural effusion. Partial atelectasis in the left lower lobe versus pneumonia. No pneumothorax. Upper Abdomen: Cyst within the left  hepatic lobe. No acute abnormality Musculoskeletal: Recent sternotomy.  No acute osseous abnormality. Review of the MIP images confirms the above findings. IMPRESSION: 1. Negative for acute pulmonary embolus or aortic dissection. 2. Moderate left pleural effusion with probable passive atelectasis at the left lower lobe 3. Recent sternotomy. Mild fluid and soft tissue stranding in the anterior mediastinum is presumably resolving operative change. Electronically Signed   By: Donavan Foil M.D.   On: 04/22/2019 00:47   Dg Chest Port 1 View  Result Date: 04/10/2019 CLINICAL DATA:  53 year old female with chest pain following CABG EXAM: PORTABLE CHEST 1 VIEW COMPARISON:  Prior chest x-ray 04/09/2019 FINDINGS: The mediastinal drain, left-sided chest tube and Swan-Ganz catheter have all been removed. The right IJ Cordis vascular sheath remains in place. Of note, the sheath is kinked at the skin insertion site. Cardiac and mediastinal contours remain unchanged. Inspiratory volumes are low with bilateral perihilar and subsegmental atelectasis. There is a small (less than 5%) left apical pneumothorax. No significant pleural effusion, pulmonary edema or new airspace opacity. No acute osseous abnormality. Status post median sternotomy. IMPRESSION: 1. Tiny left apical pneumothorax status post removal of left-sided chest tube. 2. The mediastinal drain and Swan-Ganz catheter have also been removed. 3. The remaining right IJ Cordis vascular sheath appears to be kinked at the skin entry site. 4. Low inspiratory volumes with mild bibasilar and perihilar atelectasis. Electronically Signed   By: Jacqulynn Cadet M.D.   On: 04/10/2019 09:07   Dg Chest Port 1 View  Result Date: 04/09/2019 CLINICAL DATA:  Status post CABG. EXAM: PORTABLE CHEST 1 VIEW COMPARISON:  04/08/2019 FINDINGS: Heart size and mediastinal contours are stable. Endotracheal tube and gastric decompression tube have been removed. Swan-Ganz catheter tip remains  in the proximal right pulmonary artery. Left chest tube remains with no pneumothorax identified. Lung volumes are low bilaterally. No overt edema or pleural effusions. IMPRESSION: Low lung volumes.  No edema, pneumothorax or pleural effusions. Electronically Signed   By: Aletta Edouard M.D.   On: 04/09/2019 08:04   Dg Chest Port 1 View  Result Date: 04/08/2019 CLINICAL DATA:  CABG. EXAM: PORTABLE CHEST 1 VIEW COMPARISON:  04/04/2019. FINDINGS: Endotracheal tube, NG tube, Swan-Ganz catheter, mediastinal drainage catheter, left chest tube in good anatomic position. No pneumothorax. Prior CABG. Heart size normal. Low lung volumes. Tiny left pleural effusion cannot be excluded. IMPRESSION: 1. Lines and tubes including left chest tube in good anatomic position. No pneumothorax. 2.  Tiny left pleural effusion cannot be excluded. Electronically Signed   By: Marcello Moores  Register   On: 04/08/2019 13:47   Ir US Chest  Result Date: 04/22/2019 CLINICAL DATA:  53 year old with left pleural effusion on recent CT. EXAM: CHEST ULTRASOUND COMPARISON:  Chest CT 03/23/2019 FINDINGS: Small amount of fluid along the posterior left lung base. Small percutaneous window and concern for flap of lung superficial to the fluid. IMPRESSION: Small left pleural effusion. Concern for a small amount of lung between the ribcage and the pleural fluid. Therefore, ultrasound-guided thoracentesis was not performed. Electronically Signed   By: Markus Daft M.D.   On: 04/22/2019 10:39   Vas US Doppler Pre Cabg  Result Date: 04/04/2019 PREOPERATIVE  VASCULAR EVALUATION  Indications:      Pre CABG. Risk Factors:     Coronary artery disease. Comparison Study: No exams available for comparison Performing Technologist: Birdena Crandall, [place] [facility]  Examination Guidelines: A complete evaluation includes B-mode imaging, spectral Doppler, color Doppler, and power Doppler as needed of all accessible portions of each vessel. Bilateral testing is considered an  integral part of a complete examination. Limited examinations for reoccurring indications may be performed as noted.  Right Carotid Findings: +----------+--------+--------+--------+--------+-------------------------+           PSV cm/sEDV cm/sStenosisDescribeComments                  +----------+--------+--------+--------+--------+-------------------------+ CCA Prox  67      10                      Mild intimal wall changes +----------+--------+--------+--------+--------+-------------------------+ CCA Distal76      16                      Mild intimal wall changes +----------+--------+--------+--------+--------+-------------------------+ ICA Prox  76      23                                                +----------+--------+--------+--------+--------+-------------------------+ ICA Mid   57      22                                                +----------+--------+--------+--------+--------+-------------------------+ ICA Distal58      23                                                +----------+--------+--------+--------+--------+-------------------------+ ECA       69      9                                                 +----------+--------+--------+--------+--------+-------------------------+ Portions of this table do not appear on this page. +----------+--------+-------+--------+------------+           PSV cm/sEDV cmsDescribeArm Pressure +----------+--------+-------+--------+------------+ Subclavian95                     111          +----------+--------+-------+--------+------------+ +---------+--------+--+--------+--+---------+ VertebralPSV cm/s58EDV cm/s20Antegrade +---------+--------+--+--------+--+---------+ Left Carotid Findings: +----------+--------+--------+--------+--------+-------------------------+           PSV cm/sEDV cm/sStenosisDescribeComments                   +----------+--------+--------+--------+--------+-------------------------+ CCA Prox  66      19                      Mild intimal wall changes +----------+--------+--------+--------+--------+-------------------------+ CCA Distal81      25                      Mild intimal wall changes +----------+--------+--------+--------+--------+-------------------------+ ICA Prox  75      28                                                +----------+--------+--------+--------+--------+-------------------------+  ICA Mid   42      14                                                +----------+--------+--------+--------+--------+-------------------------+ ICA Distal33      14                                                +----------+--------+--------+--------+--------+-------------------------+ ECA       62      8                                                 +----------+--------+--------+--------+--------+-------------------------+ +----------+--------+--------+--------+------------+ SubclavianPSV cm/sEDV cm/sDescribeArm Pressure +----------+--------+--------+--------+------------+           84                      111          +----------+--------+--------+--------+------------+ +---------+--------+--+--------+--+---------+ VertebralPSV cm/s65EDV cm/s19Antegrade +---------+--------+--+--------+--+---------+  ABI Findings: +--------+------------------+-----+---------+--------+ Right   Rt Pressure (mmHg)IndexWaveform Comment  +--------+------------------+-----+---------+--------+ [id number]                    triphasic         +--------+------------------+-----+---------+--------+ PTA     137               1.23 triphasic         +--------+------------------+-----+---------+--------+ DP      135               1.22 triphasic         +--------+------------------+-----+---------+--------+ +--------+------------------+-----+---------+-------+ Left     Lt Pressure (mmHg)IndexWaveform Comment +--------+------------------+-----+---------+-------+ [id number]                    triphasic        +--------+------------------+-----+---------+-------+ PTA     147               1.32 triphasic        +--------+------------------+-----+---------+-------+ DP      130               1.17 triphasic        +--------+------------------+-----+---------+-------+ +-------+---------------+----------------+ ABI/TBIToday's ABI/TBIPrevious ABI/TBI +-------+---------------+----------------+ Right  1.23                            +-------+---------------+----------------+ Left   1.32                            +-------+---------------+----------------+  Right Doppler Findings: +-----------+--------+-----+---------+--------+ Site       PressureIndexDoppler  Comments +-----------+--------+-----+---------+--------+ Brachial   111          triphasic         +-----------+--------+-----+---------+--------+ Radial                  triphasic         +-----------+--------+-----+---------+--------+ Ulnar                   triphasic         +-----------+--------+-----+---------+--------+  Palmar Arch                      Normal   +-----------+--------+-----+---------+--------+  Left Doppler Findings: +-----------+--------+-----+---------+--------+ Site       PressureIndexDoppler  Comments +-----------+--------+-----+---------+--------+ Brachial   111          triphasic         +-----------+--------+-----+---------+--------+ Radial                  biphasic          +-----------+--------+-----+---------+--------+ Ulnar                   biphasic          +-----------+--------+-----+---------+--------+ Palmar Arch                      normal   +-----------+--------+-----+---------+--------+  Summary: Right Carotid: There is no evidence of stenosis in the right ICA. Left Carotid: There is no evidence of  stenosis in the left ICA. Vertebrals:  Bilateral vertebral arteries demonstrate antegrade flow. Subclavians: Normal flow hemodynamics were seen in bilateral subclavian              arteries. Right ABI: Resting right ankle-brachial index is within normal range. No evidence of significant right lower extremity arterial disease. Left ABI: Resting left ankle-brachial index indicates noncompressible left lower extremity arteries. Right Upper Extremity: Doppler waveforms remain within normal limits with right radial compression. Doppler waveforms remain within normal limits with right ulnar compression. Left Upper Extremity: Doppler waveforms remain within normal limits with left radial compression. Doppler waveforms remain within normal limits with left ulnar compression.  Electronically signed by Servando Snare MD on 04/04/2019 at 4:11:42 PM.    Final        Subjective:   Discharge Exam: Vitals:   04/22/19 2347 04/23/19 0553  BP: 119/69 115/78  Pulse: 81 96  Resp: 18 20  Temp: 99 F (37.2 C) 98 F (36.7 C)  SpO2: 99% 96%   Vitals:   04/22/19 1954 04/22/19 2330 04/22/19 2347 04/23/19 0553  BP: 106/76  119/69 115/78  Pulse: 81 86 81 96  Resp: 16 17 18 20   Temp: 97.8 F (36.6 C)  99 F (37.2 C) 98 F (36.7 C)  TempSrc: Oral  Oral Oral  SpO2: 98% 97% 99% 96%  Weight:    80.9 kg  Height:        General: Pt is alert, awake, not in acute distress Cardiovascular: RRR, S1/S2 +, no rubs, no gallops Respiratory: CTA bilaterally, no wheezing, no rhonchi Abdominal: Soft, NT, ND, bowel sounds + Extremities: no edema, no cyanosis    The results of significant diagnostics from this hospitalization (including imaging, microbiology, ancillary and laboratory) are listed below for reference.     Microbiology: Recent Results (from the past 240 hour(s))  Blood culture (routine x 2)     Status: None (Preliminary result)   Collection Time: 04/22/19 12:45 AM   Specimen: BLOOD  Result Value Ref  Range Status   Specimen Description BLOOD LEFT ARM  Final   Special Requests   Final    BOTTLES DRAWN AEROBIC AND ANAEROBIC Blood Culture adequate volume   Culture   Final    NO GROWTH 1 DAY Performed at [facility], [address] [place], [place] [address]    Report Status PENDING  Incomplete  Blood culture (routine x 2)     Status: None (Preliminary result)  Collection Time: 04/22/19  1:00 AM   Specimen: BLOOD  Result Value Ref Range Status   Specimen Description BLOOD RIGHT HAND  Final   Special Requests   Final    BOTTLES DRAWN AEROBIC AND ANAEROBIC Blood Culture adequate volume   Culture   Final    NO GROWTH 1 DAY Performed at [facility], [place] [address] [place], [place] [address]    Report Status PENDING  Incomplete  SARS Coronavirus 2 (CEPHEID- Performed in [place] hospital lab), Hosp Order     Status: None   Collection Time: 04/22/19  1:03 AM   Specimen: Nasopharyngeal Swab  Result Value Ref Range Status   SARS Coronavirus 2 NEGATIVE NEGATIVE Final    Comment: (NOTE) If result is NEGATIVE SARS-CoV-2 target nucleic acids are NOT DETECTED. The SARS-CoV-2 RNA is generally detectable in upper and lower  respiratory specimens during the acute phase of infection. The lowest  concentration of SARS-CoV-2 viral copies this assay can detect is 250  copies / mL. A negative result does not preclude SARS-CoV-2 infection  and should not be used as the sole basis for treatment or other  patient management decisions.  A negative result may occur with  improper specimen collection / handling, submission of specimen other  than nasopharyngeal swab, presence of viral mutation(s) within the  areas targeted by this assay, and inadequate number of viral copies  (<250 copies / mL). A negative result must be combined with clinical  observations, patient history, and epidemiological information. If result is POSITIVE SARS-CoV-2 target nucleic acids are DETECTED. The  SARS-CoV-2 RNA is generally detectable in upper and lower  respiratory specimens dur ing the acute phase of infection.  Positive  results are indicative of active infection with SARS-CoV-2.  Clinical  correlation with patient history and other diagnostic information is  necessary to determine patient infection status.  Positive results do  not rule out bacterial infection or co-infection with other viruses. If result is PRESUMPTIVE POSTIVE SARS-CoV-2 nucleic acids MAY BE PRESENT.   A presumptive positive result was obtained on the submitted specimen  and confirmed on repeat testing.  While 2019 novel coronavirus  (SARS-CoV-2) nucleic acids may be present in the submitted sample  additional confirmatory testing may be necessary for epidemiological  and / or clinical management purposes  to differentiate between  SARS-CoV-2 and other Sarbecovirus currently known to infect humans.  If clinically indicated additional testing with an alternate test  methodology [phone]) is advised. The SARS-CoV-2 RNA is generally  detectable in upper and lower respiratory sp ecimens during the acute  phase of infection. The expected result is Negative. Fact Sheet for Patients:  StrictlyIdeas.no Fact Sheet for Healthcare Providers: BankingDealers.co.za This test is not yet approved or cleared by the [place] FDA and has been authorized for detection and/or diagnosis of SARS-CoV-2 by FDA under an Emergency Use Authorization (EUA).  This EUA will remain in effect (meaning this test can be used) for the duration of the COVID-19 declaration under Section 564(b)(1) of the Act, 21 U.S.C. section 360bbb-3(b)(1), unless the authorization is terminated or revoked sooner. Performed at [facility], [place] [address], [place], [place] [address]      Labs: BNP (last 3 results) Recent Labs    12/16/18 0629  BNP 30.8   Basic Metabolic Panel: Recent Labs   Lab 04/21/19 2208  NA 138  K 3.8  CL 107  CO2 22  GLUCOSE 109*  BUN 11  CREATININE  0.78  CALCIUM 8.9   Liver Function Tests: No results for input(s): AST, ALT, ALKPHOS, BILITOT, PROT, ALBUMIN in the last 168 hours. No results for input(s): LIPASE, AMYLASE in the last 168 hours. No results for input(s): AMMONIA in the last 168 hours. CBC: Recent Labs  Lab 04/21/19 2208 04/22/19 0326  WBC 19.3* 19.9*  NEUTROABS  --  15.0*  HGB 10.4* 10.6*  HCT 32.2* 34.1*  MCV 95.5 98.6  PLT 486* 497*   Cardiac Enzymes: No results for input(s): CKTOTAL, CKMB, CKMBINDEX, TROPONINI in the last 168 hours. BNP: Invalid input(s): POCBNP CBG: Recent Labs  Lab 04/21/19 2353  GLUCAP 120*   D-Dimer No results for input(s): DDIMER in the last 72 hours. Hgb A1c No results for input(s): HGBA1C in the last 72 hours. Lipid Profile No results for input(s): CHOL, HDL, LDLCALC, TRIG, CHOLHDL, LDLDIRECT in the last 72 hours. Thyroid function studies No results for input(s): TSH, T4TOTAL, T3FREE, THYROIDAB in the last 72 hours.  Invalid input(s): FREET3 Anemia work up No results for input(s): VITAMINB12, FOLATE, FERRITIN, TIBC, IRON, RETICCTPCT in the last 72 hours. Urinalysis    Component Value Date/Time   COLORURINE YELLOW 04/04/2019 1152   APPEARANCEUR HAZY (A) 04/04/2019 1152   LABSPEC 1.013 04/04/2019 1152   PHURINE 6.0 04/04/2019 1152   GLUCOSEU NEGATIVE 04/04/2019 1152   HGBUR LARGE (A) 04/04/2019 1152   BILIRUBINUR NEGATIVE 04/04/2019 1152   [place] 04/04/2019 1152   PROTEINUR NEGATIVE 04/04/2019 1152   NITRITE NEGATIVE 04/04/2019 1152   LEUKOCYTESUR SMALL (A) 04/04/2019 1152   Sepsis Labs Invalid input(s): PROCALCITONIN,  WBC,  LACTICIDVEN Microbiology Recent Results (from the past 240 hour(s))  Blood culture (routine x 2)     Status: None (Preliminary result)   Collection Time: 04/22/19 12:45 AM   Specimen: BLOOD  Result Value Ref Range Status   Specimen  Description BLOOD LEFT ARM  Final   Special Requests   Final    BOTTLES DRAWN AEROBIC AND ANAEROBIC Blood Culture adequate volume   Culture   Final    NO GROWTH 1 DAY Performed at [facility], [place] [address] [place], [place] [address]    Report Status PENDING  Incomplete  Blood culture (routine x 2)     Status: None (Preliminary result)   Collection Time: 04/22/19  1:00 AM   Specimen: BLOOD  Result Value Ref Range Status   Specimen Description BLOOD RIGHT HAND  Final   Special Requests   Final    BOTTLES DRAWN AEROBIC AND ANAEROBIC Blood Culture adequate volume   Culture   Final    NO GROWTH 1 DAY Performed at [facility], [place] [address], [place], [place] [address]    Report Status PENDING  Incomplete  SARS Coronavirus 2 (CEPHEID- Performed in [place] hospital lab), Hosp Order     Status: None   Collection Time: 04/22/19  1:03 AM   Specimen: Nasopharyngeal Swab  Result Value Ref Range Status   SARS Coronavirus 2 NEGATIVE NEGATIVE Final    Comment: (NOTE) If result is NEGATIVE SARS-CoV-2 target nucleic acids are NOT DETECTED. The SARS-CoV-2 RNA is generally detectable in upper and lower  respiratory specimens during the acute phase of infection. The lowest  concentration of SARS-CoV-2 viral copies this assay can detect is 250  copies / mL. A negative result does not preclude SARS-CoV-2 infection  and should not be used as the sole basis for treatment or other  patient management decisions.  A negative result may occur with  improper specimen collection / handling, submission of specimen other  than nasopharyngeal swab, presence of viral mutation(s) within the  areas targeted by this assay, and inadequate number of viral copies  (<250 copies / mL). A negative result must be combined with clinical  observations, patient history, and epidemiological information. If result is POSITIVE SARS-CoV-2 target nucleic acids are DETECTED. The SARS-CoV-2 RNA is generally  detectable in upper and lower  respiratory specimens dur ing the acute phase of infection.  Positive  results are indicative of active infection with SARS-CoV-2.  Clinical  correlation with patient history and other diagnostic information is  necessary to determine patient infection status.  Positive results do  not rule out bacterial infection or co-infection with other viruses. If result is PRESUMPTIVE POSTIVE SARS-CoV-2 nucleic acids MAY BE PRESENT.   A presumptive positive result was obtained on the submitted specimen  and confirmed on repeat testing.  While 2019 novel coronavirus  (SARS-CoV-2) nucleic acids may be present in the submitted sample  additional confirmatory testing may be necessary for epidemiological  and / or clinical management purposes  to differentiate between  SARS-CoV-2 and other Sarbecovirus currently known to infect humans.  If clinically indicated additional testing with an alternate test  methodology [phone]) is advised. The SARS-CoV-2 RNA is generally  detectable in upper and lower respiratory sp ecimens during the acute  phase of infection. The expected result is Negative. Fact Sheet for Patients:  StrictlyIdeas.no Fact Sheet for Healthcare Providers: BankingDealers.co.za This test is not yet approved or cleared by the [place] FDA and has been authorized for detection and/or diagnosis of SARS-CoV-2 by FDA under an Emergency Use Authorization (EUA).  This EUA will remain in effect (meaning this test can be used) for the duration of the COVID-19 declaration under Section 564(b)(1) of the Act, 21 U.S.C. section 360bbb-3(b)(1), unless the authorization is terminated or revoked sooner. Performed at [facility], [place] [address] [place], [place] [address]     Please note: You were cared for by a hospitalist during your hospital stay. Once you are discharged, your primary care physician will handle  any further medical issues. Please note that NO REFILLS for any discharge medications will be authorized once you are discharged, as it is imperative that you return to your primary care physician (or establish a relationship with a primary care physician if you do not have one) for your post hospital discharge needs so that they can reassess your need for medications and monitor your lab values.    Time coordinating discharge: 40 minutes  SIGNED:   Shelly Coss, MD  [facility] 04/23/2019, 10:51 AM Pager [phone]  If 7PM-7AM, please contact night-coverage [email or web address] Password [email or web address]

## 2019-04-23 NOTE — Consult Note (Signed)
   [facility] CM Inpatient Consult   04/23/2019  [name] [birth year] [id number]  Patient screened for  unplanned readmission score with 3rd hospitalizations in 6 months. Member [[facility]] has been outreached by [facility] Management Coordinator recently.  Review of patient's [Dr. Gwenlyn Found progress notes] medical record reveals patient is 53 y.o.femalewith coronary artery disease and recent two-vessel CABG with a LIMA to her LAD and vein graft to her diagonal done in June 2020. She also had PCI to proximal LAD in March 2020.  She is admitted here after an episode of back pain,some chest discomfort,with radiation up to the jaw and neck.Patient admitted with Acute Pericarditis per note,  Plan: Patient will be followed by [facility] RNCM for transition of care needs and will update regarding disposition.    For questions contact:   Natividad Brood, RN BSN [facility] Liaison  [phone] business mobile phone Toll free office [phone]  Fax number: [phone] [email or web address]      .

## 2019-04-25 ENCOUNTER — Other Ambulatory Visit: Payer: Self-pay | Admitting: *Deleted

## 2019-04-25 NOTE — Patient Outreach (Signed)
[facility]) Care Management  04/25/2019  [name] 1966-08-15 [id number]   Transition of care   Referral received : 04/24/19 Initial outreach : 04/25/19 Insurance: [employer] choice plan  Subjective  Initial outreach call to patient preferred number. Explained purpose of call and to complete transition of care assessment.  Patient states that she is slow going, but she is making progress .  Patient discussed recent hospital readmission, related to chest pain pericarditis.  Patient states that she has good support at home from family.   She reports tolerating diet fairly well making sure that she eats regular meals , adding yogurt to daily meal plan and tolerating liquids well to stay hydrated.   She discussed having some diarrhea she states being related to being on colchicine as part of recent discharge instructions. Discussed with patient if diarrhea worsens to notify MD sooner, reports she plans to discuss at follow cardiology visit on 7/13.  Patient denies shortness of breath, denies having fever , reports surgical incisions healing well. Patient reports pain control with using advil as prescribed x 7 days, and still has oxycodone for use as needed, but not needing it as much.  Patient discussed improvement with use of incentive spirometry up to 1000, at this time, she is tolerating in home ambulation well reports being instructed to take it easy until next cardiology visit prior to resuming ambulating as she was prior to admission.  Patient reports that her weight is 178, reinforced keeping track of daily weights and verbalizing when to call for concerns .  Patient discussed plans to establish with Dr. Paticia Stack office she may initially see NP at the office in which she is agreeable to .   Objective Patient  was hospitalized [facility] from 6/22-6/27/2020 for coronary artery bypass grafting x2  Comorbidities include: acute combined systolic and diastolic  heart failure, acute non- St elevated MI, CAD s/p drug eluting stent, ischemic cardiomyopathy, traumatic fracture of spine at T12-L1, hyperlipidemia, and morbid obesity Patient with hospital readmission on 7/5-7/7, chest pain, acute pericarditis, small left pleural effusion . No need for home health services after discharge.   Assessment  Patient voiced understanding of discharge instructions, and follow up appointments in place and importance of attending all visits,  as well as post operative concerns  to notify MD of sooner. Transition of care assessment completed.  Patient denies need for additional education material, as it has been previously provided.   Plan No ongoing care management needs identified, so will close case to [facility].  Will send patient successful outreach letter. Will update assigned CM of outreach.   Joylene Draft, RN, [place] Management Coordinator  [phone]- Mobile [phone]- Toll Free Main Office

## 2019-04-26 ENCOUNTER — Telehealth: Payer: Self-pay | Admitting: Nurse Practitioner

## 2019-04-26 NOTE — Telephone Encounter (Signed)
Pt screened by a scheduler for COVID and she reported Yes to a cough..pt has appt 04/29/19 with Truitt Merle NP.  Pt was COVID negative 04/22/19 when at the hosp.. she had a CXR due to cough then and reported some atelectasis.   Pt was in hosp for CABG 04/08/19  She has been quarantined since returning home and she denies any COVID SX such as fever, sob, cough has been minimal, no GI symptoms.. pt will call if anything changes and she is going to continue to screen who she is around at home to be sure she stays healthy and safe since she is so recently post procedure.

## 2019-04-26 NOTE — Telephone Encounter (Signed)
New Message        COVID-19 Pre-Screening Questions:   In the past 7 to 10 days have you had a cough,  shortness of breath, headache, congestion, fever (100 or greater) body aches, chills, sore throat, or sudden loss of taste or sense of smell? Cough, she says the hospital thinks her cough is from something else other then covid   Have you been around anyone with known Covid 19. NO  Have you been around anyone who is awaiting Covid 19 test results in the past 7 to 10 days?  Pt was Tested Sunday and was NEG for COVID   Have you been around anyone who has been exposed to Covid 19, or has mentioned symptoms of Covid 19 within the past 7 to 10 days? NO  If you have any concerns/questions about symptoms patients report during screening (either on the phone or at threshold). Contact the provider seeing the patient or DOD for further guidance.  If neither are available contact a member of the leadership team.

## 2019-04-26 NOTE — Telephone Encounter (Signed)
Noted  

## 2019-04-27 LAB — CULTURE, BLOOD (ROUTINE X 2)
Culture: NO GROWTH
Culture: NO GROWTH
Special Requests: ADEQUATE
Special Requests: ADEQUATE

## 2019-04-27 NOTE — Progress Notes (Signed)
CARDIOLOGY OFFICE NOTE  Date:  04/29/2019    [name] Date of Birth: [birth year] Medical Record #[id number]  PCP:  [name], MD  Cardiologist:  Jennings Books   Chief Complaint  Patient presents with   Coronary Artery Disease    Post hospital visit - seen for Dr. Tamala Julian    History of Present Illness: [name] is a 53 y.o. female who presents today for a post hospital visit. Seen for Dr. Tamala Julian.   She has a history of CAD with NSTEMI in March that was treated with DES to the LAD. There was borderline moderate ostial LAD disease. Other issues include HTN and HLD.   Since her initial event she has had issues with fatigue, subsequent chest pain - was recathed last month and there was progression of the ostial LAD disease - she was referred for CABG and had 2 vessel CABG with LIMA to LAD and SVG to DX per PVD on June 22. Post op course complicated by anemia - received transfusion. Remained in NSR.   She did well post op. She did develop recurrent chest pain - more positional and worse with breathing - ended up being readmitted with pericarditis last week - trivial effusion. She did have an elevated high sensitivity troponin and also significant leukocytosis. Seen by cardiology - started on Ibuprofen and colchicine.  Attempted left sided thoracentesis but too small of volume and the procedure was aborted.   The patient does not have symptoms concerning for COVID-19 infection (fever, chills, cough, or new shortness of breath).   Comes in today. Here alone. She is quite frustrated. She is having significant diarrhea with the colchicine (+/- the Ibuprofen) - she has lost 10 pounds. She had no stop date on the Colchicine - she cannot stay on this "too challenging due to the diarrhea" - significant discomfort/belly pain/skin irritation - having to use Desitin. She has thought she had Cdiff. Her left side of her chest is numb - that is probably the most other bothersome  issue. She has had no more chest pain. She is not able to start exercising due to being in the bathroom so much.    Past Medical History:  Diagnosis Date   Acute non-ST elevation myocardial infarction (NSTEMI) ([place]) 12/16/2018   CAD S/P DES PCI -pLAD 12/16/2018   Cath 12/15/2017: Successful proximal LAD synergy 3.0 x 15 DES postdilated to 3.25 mm reducing the stenosis to 0% ; ? 60-70% ostial LAD noted on some images.   PONV (postoperative nausea and vomiting)     Past Surgical History:  Procedure Laterality Date   CESAREAN SECTION     CORONARY ARTERY BYPASS GRAFT N/A 04/08/2019   Procedure: CORONARY ARTERY BYPASS GRAFTING (CABG) x two , using left internal mammary artery and right leg greater saphenous vein harvested endoscopically;  Surgeon: Ivin Poot, MD;  Location: [facility];  Service: Open Heart Surgery;  Laterality: N/A;   CORONARY/GRAFT ACUTE MI REVASCULARIZATION N/A 12/16/2018   Procedure: Coronary/Graft Acute MI Revascularization;  Surgeon: Belva Crome, MD;  Location: [facility];  Service: Cardiovascular;  Laterality: N/A;   IUD REMOVAL N/A 05/16/2013   Procedure: INTRAUTERINE DEVICE (IUD) REMOVAL;  Surgeon: Darlyn Chamber, MD;  Location: [facility];  Service: Gynecology;  Laterality: N/A;   LAPAROSCOPIC TUBAL LIGATION Bilateral 05/16/2013   Procedure: LAPAROSCOPIC TUBAL LIGATION;  Surgeon: Darlyn Chamber, MD;  Location: [facility];  Service: Gynecology;  Laterality: Bilateral;  LEFT HEART CATH AND CORONARY ANGIOGRAPHY N/A 12/16/2018   Procedure: LEFT HEART CATH AND CORONARY ANGIOGRAPHY;  Surgeon: Belva Crome, MD;  Location: [facility];  Service: Cardiovascular;  Laterality: N/A;   LEFT HEART CATH AND CORONARY ANGIOGRAPHY N/A 03/26/2019   Procedure: LEFT HEART CATH AND CORONARY ANGIOGRAPHY;  Surgeon: Belva Crome, MD;  Location: [facility];  Service: Cardiovascular;  Laterality: N/A;   TEE WITHOUT CARDIOVERSION N/A 04/08/2019   Procedure: TRANSESOPHAGEAL  ECHOCARDIOGRAM (TEE);  Surgeon: Prescott Gum, Collier Salina, MD;  Location: [facility];  Service: Open Heart Surgery;  Laterality: N/A;     Medications: Current Meds  Medication Sig   aspirin 81 MG chewable tablet Chew 1 tablet (81 mg total) by mouth daily.   atorvastatin (LIPITOR) 80 MG tablet Take 1 tablet (80 mg total) by mouth daily at 6 PM.   loratadine (CLARITIN) 10 MG tablet Take 10 mg by mouth daily as needed for allergies.   metoprolol tartrate (LOPRESSOR) 25 MG tablet Take 0.5 tablets (12.5 mg total) by mouth 2 (two) times daily for 30 days.   nitroGLYCERIN (NITROSTAT) 0.4 MG SL tablet Place 1 tablet (0.4 mg total) under the tongue every 5 (five) minutes as needed for chest pain.   [DISCONTINUED] colchicine 0.6 MG tablet Take 1 tablet (0.6 mg total) by mouth 2 (two) times daily.   [DISCONTINUED] ibuprofen (ADVIL) 600 MG tablet Take 1 tablet (600 mg total) by mouth 3 (three) times daily for 7 days.   [DISCONTINUED] oxyCODONE-acetaminophen (PERCOCET/ROXICET) 5-325 MG tablet Take 1-2 tablets by mouth every 4 (four) hours as needed for moderate pain or severe pain.   [DISCONTINUED] pantoprazole (PROTONIX) 40 MG tablet Take 1 tablet (40 mg total) by mouth daily.     Allergies: Allergies  Allergen Reactions   Tramadol     Made her feel very strange and groggy   Tetracycline Rash    Social History: The patient  reports that she has never smoked. She has never used smokeless tobacco. She reports current alcohol use. She reports that she does not use drugs.   Family History: The patient's family history includes CAD in her mother; Cancer in her paternal grandfather; Colon cancer in her father and another family member; Heart attack in her maternal grandfather, mother, and another family member; Heart disease in an other family member; Heart failure in her maternal grandmother; High blood pressure in her mother; Hyperlipidemia in her mother and sister.   Review of Systems: Please see the  history of present illness.   All other systems are reviewed and negative.   Physical Exam: VS:  BP 120/62 (BP Location: Left Arm, Patient Position: Sitting, Cuff Size: Normal)    Pulse 79    Ht 4\' 11"  (1.499 m)    Wt 173 lb 12.8 oz (78.8 kg)    SpO2 97%    BMI 35.10 kg/m  .  BMI Body mass index is 35.1 kg/m.  Wt Readings from Last 3 Encounters:  04/29/19 173 lb 12.8 oz (78.8 kg)  04/23/19 178 lb 6.4 oz (80.9 kg)  04/13/19 184 lb 4.9 oz (83.6 kg)    General: Pleasant. Alert and in no acute distress. Weight is down.  HEENT: Normal.  Neck: Supple, no JVD, carotid bruits, or masses noted.  Cardiac: Regular rate and rhythm. No murmurs, rubs, or gallops. No edema. Sternum looks good - she is wearing her bra. Vein harvesting on the right leg ok.  Respiratory:  Lungs are clear to  auscultation bilaterally with normal work of breathing. Moving air fine.  GI: Soft and nontender.  MS: No deformity or atrophy. Gait and ROM intact.  Skin: Warm and dry. Color is normal.  Neuro:  Strength and sensation are intact and no gross focal deficits noted.  Psych: Alert, appropriate and with normal affect.   LABORATORY DATA:  EKG:  EKG is ordered today. This demonstrates NSR.   Lab Results  Component Value Date   WBC 19.9 (H) 04/22/2019   HGB 10.6 (L) 04/22/2019   HCT 34.1 (L) 04/22/2019   PLT 497 (H) 04/22/2019   GLUCOSE 109 (H) 04/21/2019   CHOL 80 (L) 02/06/2019   TRIG 75 02/06/2019   HDL 33 (L) 02/06/2019   LDLCALC 32 02/06/2019   ALT 17 04/04/2019   AST 16 04/04/2019   NA 138 04/21/2019   K 3.8 04/21/2019   CL 107 04/21/2019   CREATININE 0.78 04/21/2019   BUN 11 04/21/2019   CO2 22 04/21/2019   TSH 4.499 12/16/2018   INR 1.6 (H) 04/08/2019   HGBA1C 5.4 04/04/2019     BNP (last 3 results) Recent Labs    12/16/18 0629  BNP 23.5    ProBNP (last 3 results) No results for input(s): PROBNP in the last 8760 hours.   Other Studies Reviewed Today:  ECHO IMPRESSIONS  04/22/2019   1. The left ventricle has normal systolic function with an ejection fraction of 60-65%. The cavity size was normal. Left ventricular diastolic parameters were normal. No evidence of left ventricular regional wall motion abnormalities.  2. Anterolateral wall incompletely visualized, but no gross wall motion abnormalities noted.  3. Moderate pleural effusion in the left lateral region.  4. Trivial pericardial effusion is present.  5. The aortic valve is tricuspid. No stenosis of the aortic valve.  6. The aortic root is normal in size and structure.  7. The interatrial septum was not assessed.  DATE OF PROCEDURE:  04/08/2019  OPERATION: 1.  Coronary artery bypass grafting x2 (left internal mammary artery to left anterior descending, saphenous vein graft to first diagonal). 2.  Endoscopic harvest of right leg greater saphenous vein.  SURGEON:  Ivin Poot, MD      Assessment/Plan:  1. Recent admission for chest pain - suspected to have pericarditis - she is post CABG - on Ibuprofen and colchicine - trivial effusion on echo. She had elevated high sensitivity troponin and abnormal EKG - her pain is now resolved - still worrisome. Will need to follow.   2. Known CAD - prior NSTEMI in March - s/p LAD stent - now s/p CABG x 2 - slow progress - readmitted with chest pain - presumed pericarditis - see #1. She is only on low dose aspirin at this time. Was not sent home on Brilinta post CABG. Hopefully once her GI symptoms resolve, she can start back walking even if just in her house due to the extreme heat. Cardiac rehab at this time is not an option due to the pandemic.   3. Significant diarrhea - she is to finish her NSAID today. Stopping Colchicine today also. If she were to have recurrent pericarditis may need to consider other options such as prednisone/Indocin. Probiotic is encouraged. If this persists she will need a stool check for Cdiff.   4. Significant leukocytosis -  no fever. No cough. Rechecking today.   5. HLD - on statin  6. General health maintenance - Dr. Edrick Oh is retiring - given names for [facility]  and [facility].   7. COVID-19 Education: The signs and symptoms of COVID-19 were discussed with the patient and how to seek care for testing (follow up with PCP or arrange E-visit).  The importance of social distancing, staying at home, hand hygiene and wearing a mask when out in public were discussed today.  Current medicines are reviewed with the patient today.  The patient does not have concerns regarding medicines other than what has been noted above.  The following changes have been made:  See above.  Labs/ tests ordered today include:    Orders Placed This Encounter  Procedures   CBC with Differential/Platelet   EKG 12-Lead     Disposition:   FU with Dr. Tamala Julian as planned later this month. I am happy to see her back as needed.   Patient is agreeable to this plan and will call if any problems develop in the interim.   SignedTruitt Merle, NP  04/29/2019 12:31 PM  [facility] [address] [place], [place]  [address] Phone: [phone] Fax: [phone]

## 2019-04-29 ENCOUNTER — Encounter: Payer: Self-pay | Admitting: Nurse Practitioner

## 2019-04-29 ENCOUNTER — Telehealth (HOSPITAL_COMMUNITY): Payer: Self-pay

## 2019-04-29 ENCOUNTER — Ambulatory Visit (INDEPENDENT_AMBULATORY_CARE_PROVIDER_SITE_OTHER): Payer: 59 | Admitting: Nurse Practitioner

## 2019-04-29 ENCOUNTER — Other Ambulatory Visit: Payer: Self-pay

## 2019-04-29 VITALS — BP 120/62 | HR 79 | Ht 59.0 in | Wt 173.8 lb

## 2019-04-29 DIAGNOSIS — E785 Hyperlipidemia, unspecified: Secondary | ICD-10-CM | POA: Diagnosis not present

## 2019-04-29 DIAGNOSIS — I319 Disease of pericardium, unspecified: Secondary | ICD-10-CM | POA: Diagnosis not present

## 2019-04-29 DIAGNOSIS — I2510 Atherosclerotic heart disease of native coronary artery without angina pectoris: Secondary | ICD-10-CM

## 2019-04-29 DIAGNOSIS — R197 Diarrhea, unspecified: Secondary | ICD-10-CM | POA: Diagnosis not present

## 2019-04-29 DIAGNOSIS — Z951 Presence of aortocoronary bypass graft: Secondary | ICD-10-CM | POA: Diagnosis not present

## 2019-04-29 NOTE — Telephone Encounter (Signed)
Pt insurance is active and benefits verified through [facility]. Co-pay $0.00, DED $300.00/$300.00 met, out of pocket $7,900.00/$7,900.00 met, co-insurance 20%. No pre-authorization required. Villisha/[facility], 04/29/2019 @ [id number], DXA#[id number]

## 2019-04-29 NOTE — Patient Instructions (Addendum)
After Visit Summary:  We will be checking the following labs today - CBC   Medication Instructions:    Continue with your current medicines. BUT  I am stopping the Colchicine  Finish the Ibuprofen today as planned.    If you need a refill on your cardiac medications before your next appointment, please call your pharmacy.     Testing/Procedures To Be Arranged:  N/A  Follow-Up:   See   Call [facility] for possible primary care - [facility] on [address] is also an option - Dr. Veronia Beets    At [facility], you and your health needs are our priority.  As part of our continuing mission to provide you with exceptional heart care, we have created designated Provider Care Teams.  These Care Teams include your primary Cardiologist (physician) and Advanced Practice Providers (APPs -  Physician Assistants and Nurse Practitioners) who all work together to provide you with the care you need, when you need it.  Special Instructions:  . Stay safe, stay home, wash your hands for at least 20 seconds and wear a mask when out in public.  . It was good to talk with you today.    Call the [place] office at [phone] if you have any questions, problems or concerns.

## 2019-04-30 LAB — CBC WITH DIFFERENTIAL/PLATELET
Basophils Absolute: 0.1 10*3/uL (ref 0.0–0.2)
Basos: 1 %
EOS (ABSOLUTE): 0.3 10*3/uL (ref 0.0–0.4)
Eos: 2 %
Hematocrit: 39.1 % (ref 34.0–46.6)
Hemoglobin: 12.6 g/dL (ref 11.1–15.9)
Immature Grans (Abs): 0 10*3/uL (ref 0.0–0.1)
Immature Granulocytes: 0 %
Lymphocytes Absolute: 2.5 10*3/uL (ref 0.7–3.1)
Lymphs: 20 %
MCH: 31 pg (ref 26.6–33.0)
MCHC: 32.2 g/dL (ref 31.5–35.7)
MCV: 96 fL (ref 79–97)
Monocytes Absolute: 0.7 10*3/uL (ref 0.1–0.9)
Monocytes: 6 %
Neutrophils Absolute: 8.8 10*3/uL — ABNORMAL HIGH (ref 1.4–7.0)
Neutrophils: 71 %
Platelets: 566 10*3/uL — ABNORMAL HIGH (ref 150–450)
RBC: 4.07 x10E6/uL (ref 3.77–5.28)
RDW: 12.7 % (ref 11.7–15.4)
WBC: 12.4 10*3/uL — ABNORMAL HIGH (ref 3.4–10.8)

## 2019-05-06 ENCOUNTER — Ambulatory Visit
Admission: RE | Admit: 2019-05-06 | Discharge: 2019-05-06 | Disposition: A | Discharge status: Home / Self Care | Payer: 59 | Source: Ambulatory Visit | Attending: Cardiothoracic Surgery | Admitting: Cardiothoracic Surgery

## 2019-05-06 ENCOUNTER — Other Ambulatory Visit: Payer: Self-pay | Admitting: *Deleted

## 2019-05-06 ENCOUNTER — Ambulatory Visit (INDEPENDENT_AMBULATORY_CARE_PROVIDER_SITE_OTHER): Payer: Self-pay | Admitting: Surgical

## 2019-05-06 ENCOUNTER — Other Ambulatory Visit: Payer: Self-pay | Admitting: Cardiothoracic Surgery

## 2019-05-06 ENCOUNTER — Other Ambulatory Visit: Payer: Self-pay

## 2019-05-06 VITALS — BP 106/75 | HR 92 | Temp 97.8°F | Resp 20 | Ht 59.0 in | Wt 182.0 lb

## 2019-05-06 DIAGNOSIS — I2510 Atherosclerotic heart disease of native coronary artery without angina pectoris: Secondary | ICD-10-CM

## 2019-05-06 DIAGNOSIS — Z951 Presence of aortocoronary bypass graft: Secondary | ICD-10-CM

## 2019-05-06 DIAGNOSIS — J9811 Atelectasis: Secondary | ICD-10-CM | POA: Diagnosis not present

## 2019-05-06 DIAGNOSIS — J90 Pleural effusion, not elsewhere classified: Secondary | ICD-10-CM | POA: Diagnosis not present

## 2019-05-06 NOTE — Patient Instructions (Signed)
Discussed progression with activities and driving

## 2019-05-06 NOTE — Progress Notes (Signed)
[address]       ,[place] [address]             [phone]      Nakeya S Lupi Tazewell Medical Record #[id number] Date of Birth: 1966-10-05  Referring: Belva Crome, MD Primary Care: [name], MD Primary Cardiologist: Sinclair Grooms, MD   Chief Complaint:   POST OP FOLLOW UP OPERATIVE REPORT  DATE OF PROCEDURE:  04/08/2019  OPERATION: 1.  Coronary artery bypass grafting x2 (left internal mammary artery to left anterior descending, saphenous vein graft to first diagonal). 2.  Endoscopic harvest of right leg greater saphenous vein.  SURGEON:  Ivin Poot, MD  ASSISTANT:  Ellwood Handler, PA-C  ANESTHESIA:  General by Dr. Annye Asa.  History of Present Illness: the patient is a 53 year old female seen in the office on today's date and routine postsurgical follow-up status post the above described procedure.  She reports that overall she is doing well.  She did get treated for a short term by cardiology after discharge for pericarditis.  She had some difficulty with colchicine causing diarrhea and has since stopped taking both that and ibuprofen.  She does feel better in this regard.  She does have some sternal incision pain in the region of the mammary artery but is no longer taking any pain medication.  He denies fevers, chills or other significant constitutional symptoms.  She does have some mild dyspnea on exertion but notes it is improving as is her ambulation.  She is not having any palpitations.  She denies any issues with lower extremity edema.  Overall she is pleased with her progress.      Past Medical History:  Diagnosis Date  . Acute non-ST elevation myocardial infarction (NSTEMI) ([place]) 12/16/2018  . CAD S/P DES PCI -pLAD 12/16/2018   Cath 12/15/2017: Successful proximal LAD synergy 3.0 x 15 DES postdilated to 3.25 mm reducing the stenosis to 0% ; ? 60-70% ostial LAD noted on some images.  Marland Kitchen PONV (postoperative  nausea and vomiting)      Social History   Tobacco Use  Smoking Status Never Smoker  Smokeless Tobacco Never Used    Social History   Substance and Sexual Activity  Alcohol Use Yes   Comment:  once a month     Allergies  Allergen Reactions  . Tramadol     Made her feel very strange and groggy  . Tetracycline Rash    Current Outpatient Medications  Medication Sig Dispense Refill  . aspirin 81 MG chewable tablet Chew 1 tablet (81 mg total) by mouth daily. 30 tablet 0  . atorvastatin (LIPITOR) 80 MG tablet Take 1 tablet (80 mg total) by mouth daily at 6 PM. 90 tablet 3  . loratadine (CLARITIN) 10 MG tablet Take 10 mg by mouth daily as needed for allergies.    . metoprolol tartrate (LOPRESSOR) 25 MG tablet Take 0.5 tablets (12.5 mg total) by mouth 2 (two) times daily for 30 days. 60 tablet 2  . nitroGLYCERIN (NITROSTAT) 0.4 MG SL tablet Place 1 tablet (0.4 mg total) under the tongue every 5 (five) minutes as needed for chest pain. 25 tablet 0   No current facility-administered medications for this visit.        Physical Exam: BP 106/75   Pulse 92   Temp 97.8 F (36.6 C) (Skin)   Resp 20   Ht 4\' 11"  (1.499 m)  Wt 82.6 kg   SpO2 96% Comment: RA  BMI 36.76 kg/m   General appearance: alert, cooperative and no distress Heart: regular rate and rhythm Lungs: clear to auscultation bilaterally Abdomen: Benign exam Extremities: No edema Wound: incis well healed without signs of infection   Diagnostic Studies & Laboratory data:     Recent Radiology Findings:   Dg Chest 2 View  Result Date: 05/06/2019 CLINICAL DATA:  Patient status post CABG 04/08/2019. EXAM: CHEST - 2 VIEW COMPARISON:  CT chest 04/22/2019.  PA and lateral chest 04/21/2019. FINDINGS: Median sternotomy wires are intact and unchanged. Small left pleural effusion and mild left basilar atelectasis have improved since the most recent plain film of the chest. Lungs are otherwise clear. No pulmonary edema.  No pneumothorax. Heart size is normal. No acute or focal bony abnormality. IMPRESSION: No acute disease. Very small left pleural effusion and basilar atelectasis are improved since the most recent exam. Electronically Signed   By: Inge Rise M.D.   On: 05/06/2019 15:05      Recent Lab Findings: Lab Results  Component Value Date   WBC 12.4 (H) 04/29/2019   HGB 12.6 04/29/2019   HCT 39.1 04/29/2019   PLT 566 (H) 04/29/2019   GLUCOSE 109 (H) 04/21/2019   CHOL 80 (L) 02/06/2019   TRIG 75 02/06/2019   HDL 33 (L) 02/06/2019   LDLCALC 32 02/06/2019   ALT 17 04/04/2019   AST 16 04/04/2019   NA 138 04/21/2019   K 3.8 04/21/2019   CL 107 04/21/2019   CREATININE 0.78 04/21/2019   BUN 11 04/21/2019   CO2 22 04/21/2019   TSH 4.499 12/16/2018   INR 1.6 (H) 04/08/2019   HGBA1C 5.4 04/04/2019      Assessment / Plan: The patient is doing quite well in her recovery.  She is no longer having any symptoms related to the pericarditis.  She does have some mild pain but is not requiring any pain medication.  We discussed activity progression.  Her chest x-ray was reviewed and is improved.  I did not change any of her current medication regimen.  We will see her again on a as needed basis for any surgically related issues or at request      Medication Changes: No orders of the defined types were placed in this encounter.     John Giovanni, PA-C 05/06/2019 3:12 PM

## 2019-05-06 NOTE — Progress Notes (Unsigned)
cxr 

## 2019-05-06 NOTE — Assessment & Plan Note (Signed)
Doing well 

## 2019-05-08 ENCOUNTER — Ambulatory Visit: Payer: 59 | Admitting: Cardiothoracic Surgery

## 2019-05-09 ENCOUNTER — Encounter (HOSPITAL_COMMUNITY): Payer: Self-pay | Admitting: *Deleted

## 2019-05-09 NOTE — Progress Notes (Signed)
Clinical review of pt follow up appt on 05/08/2019 cardiac surgeon office note. Pt is making the expected progress in recovery.  Pt is  appropriate for scheduling for cardiac rehab.  Will forward to support staff to contact this pt for desire to participate in cardiac rehab.

## 2019-05-10 ENCOUNTER — Telehealth: Payer: Self-pay

## 2019-05-10 NOTE — Telephone Encounter (Signed)

## 2019-05-11 NOTE — Progress Notes (Signed)
Cardiology Office Note:    Date:  05/13/2019   ID:  [name], DOB [birth year], MRN [id number]  PCP:  Taylor Housekeeper, MD  Cardiologist:  Sinclair Grooms, MD   Referring MD: Taylor Housekeeper, MD   Chief Complaint  Patient presents with  . Coronary Artery Disease  . Congestive Heart Failure    History of Present Illness:    [name] is a 53 y.o. female with a hx of  CAD with anterior STEMI in March that was treated with DES to the LAD, HTN. Subsequent progressive exertional chest pain and dyspnea led to re-cath and severe ostial LAD progression  --> 2 vessel CABG with LIMA to LAD and SVG to DX 04/08/2019.  This bypass pericarditis cleared with colchicine (GI upset) and ibuprofen.  Doing well.  Still has some exertional intolerance.  She is now approximately 5 weeks post bypass.  Denies clicking and popping in sternum.  Concerned about keloid development in the sternal incision.  No chills, fever, weight loss, nausea, or vomiting.  Past Medical History:  Diagnosis Date  . Acute non-ST elevation myocardial infarction (NSTEMI) ([place]) 12/16/2018  . CAD S/P DES PCI -pLAD 12/16/2018   Cath 12/15/2017: Successful proximal LAD synergy 3.0 x 15 DES postdilated to 3.25 mm reducing the stenosis to 0% ; ? 60-70% ostial LAD noted on some images.  Marland Kitchen PONV (postoperative nausea and vomiting)     Past Surgical History:  Procedure Laterality Date  . CESAREAN SECTION    . CORONARY ARTERY BYPASS GRAFT N/A 04/08/2019   Procedure: CORONARY ARTERY BYPASS GRAFTING (CABG) x two , using left internal mammary artery and right leg greater saphenous vein harvested endoscopically;  Surgeon: Ivin Poot, MD;  Location: [facility];  Service: Open Heart Surgery;  Laterality: N/A;  . CORONARY/GRAFT ACUTE MI REVASCULARIZATION N/A 12/16/2018   Procedure: Coronary/Graft Acute MI Revascularization;  Surgeon: Belva Crome, MD;  Location: [facility];  Service: Cardiovascular;  Laterality: N/A;  . IUD REMOVAL  N/A 05/16/2013   Procedure: INTRAUTERINE DEVICE (IUD) REMOVAL;  Surgeon: Darlyn Chamber, MD;  Location: [facility];  Service: Gynecology;  Laterality: N/A;  . LAPAROSCOPIC TUBAL LIGATION Bilateral 05/16/2013   Procedure: LAPAROSCOPIC TUBAL LIGATION;  Surgeon: Darlyn Chamber, MD;  Location: [facility];  Service: Gynecology;  Laterality: Bilateral;  . LEFT HEART CATH AND CORONARY ANGIOGRAPHY N/A 12/16/2018   Procedure: LEFT HEART CATH AND CORONARY ANGIOGRAPHY;  Surgeon: Belva Crome, MD;  Location: [facility];  Service: Cardiovascular;  Laterality: N/A;  . LEFT HEART CATH AND CORONARY ANGIOGRAPHY N/A 03/26/2019   Procedure: LEFT HEART CATH AND CORONARY ANGIOGRAPHY;  Surgeon: Belva Crome, MD;  Location: [facility];  Service: Cardiovascular;  Laterality: N/A;  . TEE WITHOUT CARDIOVERSION N/A 04/08/2019   Procedure: TRANSESOPHAGEAL ECHOCARDIOGRAM (TEE);  Surgeon: Prescott Gum, Collier Salina, MD;  Location: [facility];  Service: Open Heart Surgery;  Laterality: N/A;    Current Medications: Current Meds  Medication Sig  . aspirin 81 MG chewable tablet Chew 1 tablet (81 mg total) by mouth daily.  Marland Kitchen atorvastatin (LIPITOR) 80 MG tablet Take 1 tablet (80 mg total) by mouth daily at 6 PM.  . loratadine (CLARITIN) 10 MG tablet Take 10 mg by mouth daily as needed for allergies.  . metoprolol tartrate (LOPRESSOR) 25 MG tablet Take 0.5 tablets (12.5 mg total) by mouth 2 (two) times daily for 30 days.  . nitroGLYCERIN (NITROSTAT) 0.4 MG SL tablet Place  1 tablet (0.4 mg total) under the tongue every 5 (five) minutes as needed for chest pain.     Allergies:   Tramadol and Tetracycline   Social History   Socioeconomic History  . Marital status: Married    Spouse name: Not on file  . Number of children: Not on file  . Years of education: Not on file  . Highest education level: Not on file  Occupational History  . Not on file  Social Needs  . Financial resource strain: Not on file  . Food insecurity    Worry: Not  on file    Inability: Not on file  . Transportation needs    Medical: Not on file    Non-medical: Not on file  Tobacco Use  . Smoking status: Never Smoker  . Smokeless tobacco: Never Used  Substance and Sexual Activity  . Alcohol use: Yes    Comment:  once a month  . Drug use: No  . Sexual activity: Yes  Lifestyle  . Physical activity    Days per week: Not on file    Minutes per session: Not on file  . Stress: Not on file  Relationships  . Social Herbalist on phone: Not on file    Gets together: Not on file    Attends religious service: Not on file    Active member of club or organization: Not on file    Attends meetings of clubs or organizations: Not on file    Relationship status: Not on file  Other Topics Concern  . Not on file  Social History Narrative  . Not on file     Family History: The patient's family history includes CAD in her mother; Cancer in her paternal grandfather; Colon cancer in her father and another family member; Heart attack in her maternal grandfather, mother, and another family member; Heart disease in an other family member; Heart failure in her maternal grandmother; High blood pressure in her mother; Hyperlipidemia in her mother and sister.  ROS:   Please see the history of present illness.    Significant skin sensitivity in the incisional site in the sternum.  All other systems reviewed and are negative.  EKGs/Labs/Other Studies Reviewed:    The following studies were reviewed today: No new data  EKG:  EKG Not repeated.  Recent Labs: 12/16/2018: B Natriuretic Peptide 23.5; TSH 4.499 04/04/2019: ALT 17 04/09/2019: Magnesium 3.0 04/21/2019: BUN 11; Creatinine, Ser 0.78; Potassium 3.8; Sodium 138 04/29/2019: Hemoglobin 12.6; Platelets 566  Recent Lipid Panel    Component Value Date/Time   CHOL 80 (L) 02/06/2019 0955   TRIG 75 02/06/2019 0955   HDL 33 (L) 02/06/2019 0955   CHOLHDL 2.4 02/06/2019 0955   CHOLHDL 4.4 12/16/2018 0629    VLDL 15 12/16/2018 0629   LDLCALC 32 02/06/2019 0955    Physical Exam:    VS:  BP 108/68   Pulse 83   Ht 4\' 11"  (1.499 m)   Wt 179 lb (81.2 kg)   SpO2 100%   BMI 36.15 kg/m     Wt Readings from Last 3 Encounters:  05/13/19 179 lb (81.2 kg)  05/06/19 182 lb (82.6 kg)  04/29/19 173 lb 12.8 oz (78.8 kg)     GEN: Moderate obesity skin color is pink.. No acute distress HEENT: Normal NECK: No JVD. LYMPHATICS: No lymphadenopathy CARDIAC:  RRR without murmur, gallop, or edema. VASCULAR:  Normal Pulses. No bruits. 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  Clear to auscultation without rales, wheezing or rhonchi  ABDOMEN: Soft, non-tender, non-distended, No pulsatile mass, MUSCULOSKELETAL: No deformity  SKIN: Warm and dry NEUROLOGIC:  Alert and oriented x 3 PSYCHIATRIC:  Normal affect   ASSESSMENT:    1. Coronary artery disease involving native heart without angina pectoris, unspecified vessel or lesion type   2. Pericarditis, unspecified chronicity, unspecified type   3. Hyperlipidemia with target LDL less than 70   4. Essential hypertension   5. Morbid obesity ([place])   6. ST elevation myocardial infarction involving left anterior descending (LAD) coronary artery ([place])   7. Educated About Covid-19 Virus Infection    PLAN:    In order of problems listed above:  1. Healing well post bypass surgery.  Gradually increase physical activity.  She asked about getting in the swimming pool/salt water.  Seems well healed however she should discuss this with surgery before exposing herself.  Secondary prevention is in effect.  Briefly discussed. 2. No clinical evidence of recurrence. 3. Target less than 70.  Most recent 75 4. Excellent blood pressure 5. Exercise and decrease caloric intake 6. Not discussed 7. Social distancing, washing, and mask wearing stressed to prevent COVID-19.  Overall education and awareness concerning primary/secondary risk prevention was discussed  in detail: LDL less than 70, hemoglobin A1c less than 7, blood pressure target less than 130/80 mmHg, >150 minutes of moderate aerobic activity per week, avoidance of smoking, weight control (via diet and exercise), and continued surveillance/management of/for obstructive sleep apnea.    Medication Adjustments/Labs and Tests Ordered: Current medicines are reviewed at length with the patient today.  Concerns regarding medicines are outlined above.  No orders of the defined types were placed in this encounter.  No orders of the defined types were placed in this encounter.   There are no Patient Instructions on file for this visit.   Signed, Sinclair Grooms, MD  05/13/2019 11:34 AM    [place]

## 2019-05-13 ENCOUNTER — Encounter: Payer: Self-pay | Admitting: Interventional Cardiology

## 2019-05-13 ENCOUNTER — Ambulatory Visit (INDEPENDENT_AMBULATORY_CARE_PROVIDER_SITE_OTHER): Payer: 59 | Admitting: Interventional Cardiology

## 2019-05-13 ENCOUNTER — Other Ambulatory Visit: Payer: Self-pay

## 2019-05-13 VITALS — BP 108/68 | HR 83 | Ht 59.0 in | Wt 179.0 lb

## 2019-05-13 DIAGNOSIS — E785 Hyperlipidemia, unspecified: Secondary | ICD-10-CM | POA: Diagnosis not present

## 2019-05-13 DIAGNOSIS — Z955 Presence of coronary angioplasty implant and graft: Secondary | ICD-10-CM | POA: Diagnosis not present

## 2019-05-13 DIAGNOSIS — I2102 ST elevation (STEMI) myocardial infarction involving left anterior descending coronary artery: Secondary | ICD-10-CM

## 2019-05-13 DIAGNOSIS — I252 Old myocardial infarction: Secondary | ICD-10-CM

## 2019-05-13 DIAGNOSIS — I2510 Atherosclerotic heart disease of native coronary artery without angina pectoris: Secondary | ICD-10-CM | POA: Diagnosis not present

## 2019-05-13 DIAGNOSIS — Z6836 Body mass index (BMI) 36.0-36.9, adult: Secondary | ICD-10-CM

## 2019-05-13 DIAGNOSIS — I10 Essential (primary) hypertension: Secondary | ICD-10-CM

## 2019-05-13 DIAGNOSIS — Z7189 Other specified counseling: Secondary | ICD-10-CM

## 2019-05-13 DIAGNOSIS — Z951 Presence of aortocoronary bypass graft: Secondary | ICD-10-CM

## 2019-05-13 DIAGNOSIS — I319 Disease of pericardium, unspecified: Secondary | ICD-10-CM

## 2019-05-13 NOTE — Patient Instructions (Signed)
Medication Instructions:  Your physician recommends that you continue on your current medications as directed. Please refer to the Current Medication list given to you today.  If you need a refill on your cardiac medications before your next appointment, please call your pharmacy.   Lab work: None If you have labs (blood work) drawn today and your tests are completely normal, you will receive your results only by: . MyChart Message (if you have MyChart) OR . A paper copy in the mail If you have any lab test that is abnormal or we need to change your treatment, we will call you to review the results.  Testing/Procedures: None  Follow-Up: At [facility], you and your health needs are our priority.  As part of our continuing mission to provide you with exceptional heart care, we have created designated Provider Care Teams.  These Care Teams include your primary Cardiologist (physician) and Advanced Practice Providers (APPs -  Physician Assistants and Nurse Practitioners) who all work together to provide you with the care you need, when you need it. You will need a follow up appointment in 3 months.  Please call our office 2 months in advance to schedule this appointment.  You may see Henry W Smith III, MD or one of the following Advanced Practice Providers on your designated Care Team:   Lori Gerhardt, NP Laura Ingold, NP . Jill McDaniel, NP  Any Other Special Instructions Will Be Listed Below (If Applicable).    

## 2019-05-16 ENCOUNTER — Telehealth (HOSPITAL_COMMUNITY): Payer: Self-pay

## 2019-05-29 ENCOUNTER — Telehealth (HOSPITAL_COMMUNITY): Payer: Self-pay

## 2019-05-29 NOTE — Telephone Encounter (Signed)
Cardiac Rehab Medication Review by a Pharmacist  Does the patient  feel that his/her medications are working for him/her?  yes  Has the patient been experiencing any side effects to the medications prescribed?  no  Does the patient measure his/her own blood pressure or blood glucose at home?  yes occasionally. Approximately 110s/60-70s.   Does the patient have any problems obtaining medications due to transportation or finances?   no  Understanding of regimen: good Understanding of indications: good Potential of compliance: good    Pharmacist comments: N/A  Cristela Felt, PharmD PGY1 Pharmacy Resident [employer]: [phone]  05/29/2019 1:39 PM

## 2019-05-30 ENCOUNTER — Telehealth (HOSPITAL_COMMUNITY): Payer: Self-pay | Admitting: *Deleted

## 2019-06-04 ENCOUNTER — Encounter (HOSPITAL_COMMUNITY): Payer: Self-pay

## 2019-06-04 ENCOUNTER — Encounter (HOSPITAL_COMMUNITY)
Admission: RE | Admit: 2019-06-04 | Discharge: 2019-06-04 | Disposition: A | Discharge status: Home / Self Care | Payer: 59 | Source: Ambulatory Visit | Attending: Interventional Cardiology | Admitting: Interventional Cardiology

## 2019-06-04 ENCOUNTER — Other Ambulatory Visit: Payer: Self-pay

## 2019-06-04 ENCOUNTER — Other Ambulatory Visit: Payer: Self-pay | Admitting: Interventional Cardiology

## 2019-06-04 VITALS — BP 104/62 | HR 85 | Temp 97.3°F | Ht 59.0 in | Wt 181.7 lb

## 2019-06-04 DIAGNOSIS — Z951 Presence of aortocoronary bypass graft: Secondary | ICD-10-CM | POA: Insufficient documentation

## 2019-06-04 DIAGNOSIS — I2102 ST elevation (STEMI) myocardial infarction involving left anterior descending coronary artery: Secondary | ICD-10-CM | POA: Insufficient documentation

## 2019-06-04 DIAGNOSIS — Z955 Presence of coronary angioplasty implant and graft: Secondary | ICD-10-CM | POA: Insufficient documentation

## 2019-06-04 HISTORY — DX: Hyperlipidemia, unspecified: E78.5

## 2019-06-04 MED ORDER — METOPROLOL TARTRATE 25 MG PO TABS: 12.5000 mg | ORAL_TABLET | Freq: Two times a day (BID) | ORAL | 3 refills | Status: DC

## 2019-06-04 MED FILL — METOPROLOL TARTRATE 25 MG T: 25 | 90 days supply | Qty: 90 | Fill #0

## 2019-06-04 NOTE — Telephone Encounter (Signed)
Pt's medication was sent to pt's pharmacy as requested. Confirmation received.  °

## 2019-06-04 NOTE — Telephone Encounter (Signed)
° ° ° °*  STAT* If patient is at the pharmacy, call can be transferred to refill team.   1. Which medications need to be refilled? (please list name of each medication and dose if known) metoprolol tartrate (LOPRESSOR) 25 MG tablet 2. Which pharmacy/location (including street and city if local pharmacy) is medication to be sent to? [facility] outpatient  3. Do they need a 30 day or 90 day supply? [place]

## 2019-06-04 NOTE — Progress Notes (Signed)
Cardiac Individual Treatment Plan  Patient Details  Name: [name] MRN: [id number] Date of Birth: 1966/01/03 Referring Provider:     CARDIAC REHAB PHASE II ORIENTATION from 06/04/2019 in [place]  Referring Provider  Dr. Tamala Julian      Initial Encounter Date:    CARDIAC REHAB PHASE II ORIENTATION from 06/04/2019 in [place]  Date  06/04/19      Visit Diagnosis: S/P CABG x2 04/08/19 S/P NSTEMI 12/16/18 S/P DES LAD  Patient's Home Medications on Admission:  Current Outpatient Medications:  .  aspirin 81 MG chewable tablet, Chew 1 tablet (81 mg total) by mouth daily., Disp: 30 tablet, Rfl: 0 .  atorvastatin (LIPITOR) 80 MG tablet, Take 1 tablet (80 mg total) by mouth daily at 6 PM., Disp: 90 tablet, Rfl: 3 .  loratadine (CLARITIN) 10 MG tablet, Take 10 mg by mouth daily as needed for allergies., Disp: , Rfl:  .  nitroGLYCERIN (NITROSTAT) 0.4 MG SL tablet, Place 1 tablet (0.4 mg total) under the tongue every 5 (five) minutes as needed for chest pain., Disp: 25 tablet, Rfl: 0 .  metoprolol tartrate (LOPRESSOR) 25 MG tablet, Take 0.5 tablets (12.5 mg total) by mouth 2 (two) times daily., Disp: 180 tablet, Rfl: 3  Past Medical History: Past Medical History:  Diagnosis Date  . Acute non-ST elevation myocardial infarction (NSTEMI) ([place]) 12/16/2018  . CAD S/P DES PCI -pLAD 12/16/2018   Cath 12/15/2017: Successful proximal LAD synergy 3.0 x 15 DES postdilated to 3.25 mm reducing the stenosis to 0% ; ? 60-70% ostial LAD noted on some images.  . Hyperlipidemia   . PONV (postoperative nausea and vomiting)     Tobacco Use: Social History   Tobacco Use  Smoking Status Never Smoker  Smokeless Tobacco Never Used    Labs: Recent Review Flowsheet Data    Labs for ITP Cardiac and Pulmonary Rehab Latest Ref Rng & Units 04/08/2019 04/08/2019 04/08/2019 04/08/2019 04/08/2019   Cholestrol 100 - 199 mg/dL - - - - -   LDLCALC 0 - 99 mg/dL  - - - - -   HDL >39 mg/dL - - - - -   Trlycerides 0 - 149 mg/dL - - - - -   Hemoglobin A1c 4.8 - 5.6 % - - - - -   PHART 7.350 - 7.450 7.475(H) 7.377 7.446 7.344(L) 7.335(L)   PCO2ART 32.0 - 48.0 mmHg 31.4(L) 38.4 28.6(L) 36.6 40.2   HCO3 20.0 - 28.0 mmol/L 23.1 22.6 20.1 19.9(L) 21.4   TCO2 22 - 32 mmol/L 24 24 21(L) 21(L) 23   ACIDBASEDEF 0.0 - 2.0 mmol/L - 2.0 4.0(H) 5.0(H) 4.0(H)   O2SAT % 100.0 99.0 99.0 98.0 100.0      Capillary Blood Glucose: Lab Results  Component Value Date   GLUCAP 120 (H) 04/21/2019   GLUCAP 86 04/11/2019   GLUCAP 74 04/11/2019   GLUCAP 96 04/11/2019   GLUCAP 105 (H) 04/11/2019     Exercise Target Goals: Exercise Program Goal: Individual exercise prescription set using results from initial 6 min walk test and THRR while considering  patient's activity barriers and safety.   Exercise Prescription Goal: Starting with aerobic activity 30 plus minutes a day, 3 days per week for initial exercise prescription. Provide home exercise prescription and guidelines that participant acknowledges understanding prior to discharge.  Activity Barriers & Risk Stratification: Activity Barriers & Cardiac Risk Stratification - 06/04/19 1534      Activity Barriers &  Cardiac Risk Stratification   Activity Barriers  Deconditioning;Shortness of Breath;Incisional Pain    Cardiac Risk Stratification  High       6 Minute Walk: 6 Minute Walk    Row Name 06/04/19 1533         6 Minute Walk   Phase  Initial     Distance  1283 feet     Walk Time  6 minutes     # of Rest Breaks  0     MPH  2.4     METS  3.3     RPE  13     Perceived Dyspnea   1     VO2 Peak  11.89     Symptoms  Yes (comment)     Comments  Miold SOB     Resting HR  85 bpm     Resting BP  104/62     Resting Oxygen Saturation   100 %     Exercise Oxygen Saturation  during 6 min walk  98 %     Max Ex. HR  109 bpm     Max Ex. BP  122/68     2 Minute Post BP  102/62        Oxygen Initial  Assessment:   Oxygen Re-Evaluation:   Oxygen Discharge (Final Oxygen Re-Evaluation):   Initial Exercise Prescription: Initial Exercise Prescription - 06/04/19 1500      Date of Initial Exercise RX and Referring Provider   Date  06/04/19    Referring Provider  Dr. Tamala Julian    Expected Discharge Date  07/19/19      Treadmill   MPH  2.2    Grade  0    Minutes  15      NuStep   Level  3    SPM  85    Minutes  15    METs  2.9      Prescription Details   Frequency (times per week)  3    Duration  Progress to 30 minutes of continuous aerobic without signs/symptoms of physical distress      Intensity   THRR 40-80% of Max Heartrate  68-136    Ratings of Perceived Exertion  11-13    Perceived Dyspnea  0-4      Progression   Progression  Continue to progress workloads to maintain intensity without signs/symptoms of physical distress.      Resistance Training   Training Prescription  Yes    Weight  3 lbs.     Reps  10-15       Perform Capillary Blood Glucose checks as needed.  Exercise Prescription Changes:   Exercise Comments:   Exercise Goals and Review:  Exercise Goals    Row Name 06/04/19 1536             Exercise Goals   Increase Physical Activity  Yes       Intervention  Provide advice, education, support and counseling about physical activity/exercise needs.;Develop an individualized exercise prescription for aerobic and resistive training based on initial evaluation findings, risk stratification, comorbidities and participant's personal goals.       Expected Outcomes  Short Term: Attend rehab on a regular basis to increase amount of physical activity.;Long Term: Add in home exercise to make exercise part of routine and to increase amount of physical activity.;Long Term: Exercising regularly at least 3-5 days a week.       Increase Strength and Stamina  Yes  Intervention  Provide advice, education, support and counseling about physical  activity/exercise needs.;Develop an individualized exercise prescription for aerobic and resistive training based on initial evaluation findings, risk stratification, comorbidities and participant's personal goals.       Expected Outcomes  Short Term: Increase workloads from initial exercise prescription for resistance, speed, and METs.;Short Term: Perform resistance training exercises routinely during rehab and add in resistance training at home;Long Term: Improve cardiorespiratory fitness, muscular endurance and strength as measured by increased METs and functional capacity (6MWT)       Able to understand and use rate of perceived exertion (RPE) scale  Yes       Intervention  Provide education and explanation on how to use RPE scale       Expected Outcomes  Short Term: Able to use RPE daily in rehab to express subjective intensity level;Long Term:  Able to use RPE to guide intensity level when exercising independently       Knowledge and understanding of Target Heart Rate Range (THRR)  Yes       Intervention  Provide education and explanation of THRR including how the numbers were predicted and where they are located for reference       Expected Outcomes  Short Term: Able to state/look up THRR;Long Term: Able to use THRR to govern intensity when exercising independently;Short Term: Able to use daily as guideline for intensity in rehab       Able to check pulse independently  Yes       Intervention  Provide education and demonstration on how to check pulse in carotid and radial arteries.;Review the importance of being able to check your own pulse for safety during independent exercise       Expected Outcomes  Short Term: Able to explain why pulse checking is important during independent exercise;Long Term: Able to check pulse independently and accurately       Understanding of Exercise Prescription  Yes       Intervention  Provide education, explanation, and written materials on patient's individual  exercise prescription       Expected Outcomes  Short Term: Able to explain program exercise prescription;Long Term: Able to explain home exercise prescription to exercise independently          Exercise Goals Re-Evaluation :    Discharge Exercise Prescription (Final Exercise Prescription Changes):   Nutrition:  Target Goals: Understanding of nutrition guidelines, daily intake of sodium 1500mg , cholesterol 200mg , calories 30% from fat and 7% or less from saturated fats, daily to have 5 or more servings of fruits and vegetables.  Biometrics: Pre Biometrics - 06/04/19 1537      Pre Biometrics   Height  4\' 11"  (1.499 m)    Weight  82.4 kg    Waist Circumference  40 inches    Hip Circumference  45 inches    Waist to Hip Ratio  0.89 %    BMI (Calculated)  36.67    Triceps Skinfold  42 mm    % Body Fat  47.6 %    Grip Strength  29 kg    Flexibility  15 in    Single Leg Stand  12.81 seconds        Nutrition Therapy Plan and Nutrition Goals:   Nutrition Assessments:   Nutrition Goals Re-Evaluation:   Nutrition Goals Discharge (Final Nutrition Goals Re-Evaluation):   Psychosocial: Target Goals: Acknowledge presence or absence of significant depression and/or stress, maximize coping skills, provide positive support system. Participant  is able to verbalize types and ability to use techniques and skills needed for reducing stress and depression.  Initial Review & Psychosocial Screening: Initial Psych Review & Screening - 06/04/19 1505      Initial Review   Current issues with  Current Stress Concerns    Source of Stress Concerns  Unable to participate in former interests or hobbies    Comments  Nolia has not been able to do the things she normally participated in before her cardiac event      [place]?  Yes   [place] has her husband for support     Barriers   Psychosocial barriers to participate in program  The patient should benefit  from training in stress management and relaxation.      Screening Interventions   Interventions  Encouraged to exercise;To provide support and resources with identified psychosocial needs       Quality of Life Scores: Quality of Life - 06/04/19 1612      Quality of Life Scores   Health/Function Pre  15.03 %    Socioeconomic Pre  25.64 %    Psych/Spiritual Pre  26.14 %    Family Pre  24 %    GLOBAL Pre  20.82 %      Scores of 19 and below usually indicate a poorer quality of life in these areas.  A difference of  2-3 points is a clinically meaningful difference.  A difference of 2-3 points in the total score of the Quality of Life Index has been associated with significant improvement in overall quality of life, self-image, physical symptoms, and general health in studies assessing change in quality of life.  PHQ-9: Recent Review Flowsheet Data    Depression screen [facility] 2/9 06/04/2019   Decreased Interest 0   Down, Depressed, Hopeless 0   PHQ - 2 Score 0     Interpretation of Total Score  Total Score Depression Severity:  1-4 = Minimal depression, 5-9 = Mild depression, 10-14 = Moderate depression, 15-19 = Moderately severe depression, 20-27 = Severe depression   Psychosocial Evaluation and Intervention:   Psychosocial Re-Evaluation:   Psychosocial Discharge (Final Psychosocial Re-Evaluation):   Vocational Rehabilitation: Provide vocational rehab assistance to qualifying candidates.   Vocational Rehab Evaluation & Intervention: Vocational Rehab - 06/04/19 1507      Initial Vocational Rehab Evaluation & Intervention   Assessment shows need for Vocational Rehabilitation  No      Vocational Rehab Re-Evaulation   Comments  Miu is currently employed and does not need vocational rehab at this time.       Education: Education Goals: Education classes will be provided on a weekly basis, covering required topics. Participant will state understanding/return demonstration  of topics presented.  Learning Barriers/Preferences: Learning Barriers/Preferences - 06/04/19 1542      Learning Barriers/Preferences   Learning Barriers  None    Learning Preferences  Written Material;Skilled Demonstration;Pictoral;Video       Education Topics: Hypertension, Hypertension Reduction -Define heart disease and high blood pressure. Discus how high blood pressure affects the body and ways to reduce high blood pressure.   Exercise and Your Heart -Discuss why it is important to exercise, the FITT principles of exercise, normal and abnormal responses to exercise, and how to exercise safely.   Angina -Discuss definition of angina, causes of angina, treatment of angina, and how to decrease risk of having angina.   Cardiac Medications -Review what the following cardiac medications are  used for, how they affect the body, and side effects that may occur when taking the medications.  Medications include Aspirin, Beta blockers, calcium channel blockers, ACE Inhibitors, angiotensin receptor blockers, diuretics, digoxin, and antihyperlipidemics.   Congestive Heart Failure -Discuss the definition of CHF, how to live with CHF, the signs and symptoms of CHF, and how keep track of weight and sodium intake.   Heart Disease and Intimacy -Discus the effect sexual activity has on the heart, how changes occur during intimacy as we age, and safety during sexual activity.   Smoking Cessation / COPD -Discuss different methods to quit smoking, the health benefits of quitting smoking, and the definition of COPD.   Nutrition I: Fats -Discuss the types of cholesterol, what cholesterol does to the heart, and how cholesterol levels can be controlled.   Nutrition II: Labels -Discuss the different components of food labels and how to read food label   Heart Parts/Heart Disease and PAD -Discuss the anatomy of the heart, the pathway of blood circulation through the heart, and these are  affected by heart disease.   Stress I: Signs and Symptoms -Discuss the causes of stress, how stress may lead to anxiety and depression, and ways to limit stress.   Stress II: Relaxation -Discuss different types of relaxation techniques to limit stress.   Warning Signs of Stroke / TIA -Discuss definition of a stroke, what the signs and symptoms are of a stroke, and how to identify when someone is having stroke.   Knowledge Questionnaire Score: Knowledge Questionnaire Score - 06/04/19 1542      Knowledge Questionnaire Score   Pre Score  23/24       Core Components/Risk Factors/Patient Goals at Admission: Personal Goals and Risk Factors at Admission - 06/04/19 1609      Core Components/Risk Factors/Patient Goals on Admission    Weight Management  Yes;Obesity;Weight Maintenance;Weight Loss    Intervention  Weight Management: Provide education and appropriate resources to help participant work on and attain dietary goals.;Weight Management/Obesity: Establish reasonable short term and long term weight goals.;Obesity: Provide education and appropriate resources to help participant work on and attain dietary goals.    Admit Weight  181 lb 10.5 oz (82.4 kg)    Lipids  Yes    Intervention  Provide education and support for participant on nutrition & aerobic/resistive exercise along with prescribed medications to achieve LDL 70mg , HDL >40mg .    Expected Outcomes  Short Term: Participant states understanding of desired cholesterol values and is compliant with medications prescribed. Participant is following exercise prescription and nutrition guidelines.;Long Term: Cholesterol controlled with medications as prescribed, with individualized exercise RX and with personalized nutrition plan. Value goals: LDL < 70mg , HDL > 40 mg.    Stress  Yes    Intervention  Offer individual and/or small group education and counseling on adjustment to heart disease, stress management and health-related lifestyle  change. Teach and support self-help strategies.;Refer participants experiencing significant psychosocial distress to appropriate mental health specialists for further evaluation and treatment. When possible, include family members and significant others in education/counseling sessions.    Expected Outcomes  Short Term: Participant demonstrates changes in health-related behavior, relaxation and other stress management skills, ability to obtain effective social support, and compliance with psychotropic medications if prescribed.;Long Term: Emotional wellbeing is indicated by absence of clinically significant psychosocial distress or social isolation.       Core Components/Risk Factors/Patient Goals Review:    Core Components/Risk Factors/Patient Goals at Discharge (Final Review):  ITP Comments: ITP Comments    Row Name 06/04/19 1503           ITP Comments  Dr Fransico Him Md, Medical Director          Comments: Ambyr attended orientation on 06/04/2019 to review rules and guidelines for program.  Completed 6 minute walk test, Intitial ITP, and exercise prescription.  VSS. Telemetry-Sinus Rhythm. March did have some mild shortness of breath that resolve after rest. Safety measures and social distancing in place per CDC guidelines. QUALITY OF LIFE SCORE REVIEW  Pt completed Quality of Life survey as a participant in Cardiac Rehab. Scores 21.0 or below are considered low. Pt score very low in health and functioning. Overall 20.82, Health and Function 15.03, socioeconomic 25.64, physiological and spiritual 26.14, family 24.00. Patient quality of life slightly altered by physical constraints which limits ability to perform as prior to recent cardiac illness. Angie has low score in health and functioning due to her recent cardiac events. Offered emotional support and reassurance.  Will continue to monitor and intervene as necessary.  Angie has good support at home and says she does not need  additional help or resources at this time. Will provide emotional support as needed.Barnet Pall, RN,BSN 06/04/2019 4:19 PM

## 2019-06-10 ENCOUNTER — Encounter (HOSPITAL_COMMUNITY): Payer: Self-pay

## 2019-06-10 ENCOUNTER — Other Ambulatory Visit: Payer: Self-pay

## 2019-06-10 ENCOUNTER — Encounter (HOSPITAL_COMMUNITY)
Admission: RE | Admit: 2019-06-10 | Discharge: 2019-06-10 | Disposition: A | Discharge status: Home / Self Care | Payer: 59 | Source: Ambulatory Visit | Attending: Interventional Cardiology | Admitting: Interventional Cardiology

## 2019-06-10 DIAGNOSIS — I2102 ST elevation (STEMI) myocardial infarction involving left anterior descending coronary artery: Secondary | ICD-10-CM

## 2019-06-10 DIAGNOSIS — Z955 Presence of coronary angioplasty implant and graft: Secondary | ICD-10-CM

## 2019-06-10 DIAGNOSIS — Z951 Presence of aortocoronary bypass graft: Secondary | ICD-10-CM | POA: Diagnosis not present

## 2019-06-10 NOTE — Progress Notes (Signed)
Daily Session Note  Patient Details  Name: [name] MRN: [id number] Date of Birth: 16-Sep-1966 Referring Provider:     CARDIAC REHAB PHASE II ORIENTATION from 06/04/2019 in [place]  Referring Provider  Dr. Tamala Julian      Encounter Date: 06/10/2019  Check In: Session Check In - 06/10/19 1115      Check-In   Supervising physician immediately available to respond to emergencies  [facility] immediately available    Physician(s)  Dr. Benny Lennert    Location  [facility]    Staff Present  Jiles Garter, RN, Deland Pretty, MS, ACSM CEP, Exercise Physiologist;Brittany Durene Fruits, BS, ACSM CEP, Exercise Physiologist;Maria Venetia Maxon, RN, Bjorn Loser, MS, Exercise Physiologist    Virtual Visit  No    Medication changes reported      No    Fall or balance concerns reported     No    Tobacco Cessation  No Change    Warm-up and Cool-down  Performed on first and last piece of equipment    Resistance Training Performed  Yes    VAD Patient?  No    PAD/SET Patient?  No      Pain Assessment   Currently in Pain?  No/denies    Multiple Pain Sites  No       Capillary Blood Glucose: No results found for this or any previous visit (from the past 24 hour(s)).  Exercise Prescription Changes - 06/10/19 1101      Response to Exercise   Blood Pressure (Admit)  104/72    Blood Pressure (Exercise)  118/78    Blood Pressure (Exit)  100/80    Heart Rate (Admit)  96 bpm    Heart Rate (Exercise)  112 bpm    Heart Rate (Exit)  94 bpm    Rating of Perceived Exertion (Exercise)  13    Symptoms  none    Comments  Pt tolerated 1st exercise well.    Duration  Continue with 30 min of aerobic exercise without signs/symptoms of physical distress.    Intensity  THRR unchanged      Progression   Progression  Continue to progress workloads to maintain intensity without signs/symptoms of physical distress.    Average METs  2.3      Resistance  Training   Training Prescription  Yes    Weight  3 lbs.     Reps  10-15    Time  10 Minutes      Interval Training   Interval Training  No      Treadmill   MPH  2.2    Grade  0    Minutes  15    METs  2.69      NuStep   Level  3    SPM  85    Minutes  15    METs  2       Social History   Tobacco Use  Smoking Status Never Smoker  Smokeless Tobacco Never Used    Goals Met:  Exercise tolerated well  Goals Unmet:  Not Applicable  Comments: Pt started cardiac rehab today.  Pt tolerated light exercise without difficulty. VSS, telemetry-SR, asymptomatic.  Medication list reconciled. Pt denies barriers to medicaiton compliance.  PSYCHOSOCIAL ASSESSMENT:  PHQ-0. Pt exhibits positive coping skills, hopeful outlook with supportive family. No psychosocial needs identified at this time, no psychosocial interventions necessary.  Pt oriented to exercise equipment and routine.    Understanding  verbalized.    Dr. Fransico Him is Medical Director for Cardiac Rehab at [facility].

## 2019-06-12 ENCOUNTER — Encounter (HOSPITAL_COMMUNITY)
Admission: RE | Admit: 2019-06-12 | Discharge: 2019-06-12 | Disposition: A | Discharge status: Home / Self Care | Payer: 59 | Source: Ambulatory Visit | Attending: Interventional Cardiology | Admitting: Interventional Cardiology

## 2019-06-12 ENCOUNTER — Other Ambulatory Visit: Payer: Self-pay

## 2019-06-12 DIAGNOSIS — Z951 Presence of aortocoronary bypass graft: Secondary | ICD-10-CM | POA: Diagnosis not present

## 2019-06-12 DIAGNOSIS — Z955 Presence of coronary angioplasty implant and graft: Secondary | ICD-10-CM

## 2019-06-12 DIAGNOSIS — I2102 ST elevation (STEMI) myocardial infarction involving left anterior descending coronary artery: Secondary | ICD-10-CM | POA: Diagnosis not present

## 2019-06-13 DIAGNOSIS — I2510 Atherosclerotic heart disease of native coronary artery without angina pectoris: Secondary | ICD-10-CM | POA: Diagnosis not present

## 2019-06-13 DIAGNOSIS — R82998 Other abnormal findings in urine: Secondary | ICD-10-CM | POA: Diagnosis not present

## 2019-06-13 DIAGNOSIS — Z1331 Encounter for screening for depression: Secondary | ICD-10-CM | POA: Diagnosis not present

## 2019-06-13 DIAGNOSIS — E785 Hyperlipidemia, unspecified: Secondary | ICD-10-CM | POA: Diagnosis not present

## 2019-06-13 DIAGNOSIS — Z951 Presence of aortocoronary bypass graft: Secondary | ICD-10-CM | POA: Diagnosis not present

## 2019-06-14 ENCOUNTER — Other Ambulatory Visit: Payer: Self-pay

## 2019-06-14 ENCOUNTER — Encounter (HOSPITAL_COMMUNITY)
Admission: RE | Admit: 2019-06-14 | Discharge: 2019-06-14 | Disposition: A | Discharge status: Home / Self Care | Payer: 59 | Source: Ambulatory Visit | Attending: Interventional Cardiology | Admitting: Interventional Cardiology

## 2019-06-14 DIAGNOSIS — I2102 ST elevation (STEMI) myocardial infarction involving left anterior descending coronary artery: Secondary | ICD-10-CM

## 2019-06-14 DIAGNOSIS — Z951 Presence of aortocoronary bypass graft: Secondary | ICD-10-CM

## 2019-06-14 DIAGNOSIS — Z955 Presence of coronary angioplasty implant and graft: Secondary | ICD-10-CM | POA: Diagnosis not present

## 2019-06-17 ENCOUNTER — Encounter (HOSPITAL_COMMUNITY)
Admission: RE | Admit: 2019-06-17 | Discharge: 2019-06-17 | Disposition: A | Discharge status: Home / Self Care | Payer: 59 | Source: Ambulatory Visit | Attending: Interventional Cardiology | Admitting: Interventional Cardiology

## 2019-06-17 ENCOUNTER — Other Ambulatory Visit: Payer: Self-pay

## 2019-06-17 DIAGNOSIS — I2102 ST elevation (STEMI) myocardial infarction involving left anterior descending coronary artery: Secondary | ICD-10-CM

## 2019-06-17 DIAGNOSIS — Z955 Presence of coronary angioplasty implant and graft: Secondary | ICD-10-CM | POA: Diagnosis not present

## 2019-06-17 DIAGNOSIS — Z951 Presence of aortocoronary bypass graft: Secondary | ICD-10-CM | POA: Diagnosis not present

## 2019-06-17 NOTE — Progress Notes (Signed)
Reviewed home exercise guidelines with patient including endpoints, temperature precautions, target heart rate and rate of perceived exertion. Pt is walking at least 60 minutes daily and stretching as her mode of home exercise. Pt has a treadmill at home that she uses when the weather isn't conducive to exercise outdoors. Pt voices understanding of instructions given.  Sol Passer, MS, ACSM CEP

## 2019-06-19 ENCOUNTER — Encounter (HOSPITAL_COMMUNITY)
Admission: RE | Admit: 2019-06-19 | Discharge: 2019-06-19 | Disposition: A | Discharge status: Home / Self Care | Payer: 59 | Source: Ambulatory Visit | Attending: Interventional Cardiology | Admitting: Interventional Cardiology

## 2019-06-19 ENCOUNTER — Other Ambulatory Visit: Payer: Self-pay

## 2019-06-19 DIAGNOSIS — Z951 Presence of aortocoronary bypass graft: Secondary | ICD-10-CM | POA: Diagnosis not present

## 2019-06-19 DIAGNOSIS — Z955 Presence of coronary angioplasty implant and graft: Secondary | ICD-10-CM | POA: Insufficient documentation

## 2019-06-19 DIAGNOSIS — I2102 ST elevation (STEMI) myocardial infarction involving left anterior descending coronary artery: Secondary | ICD-10-CM | POA: Insufficient documentation

## 2019-06-20 DIAGNOSIS — Z23 Encounter for immunization: Secondary | ICD-10-CM | POA: Diagnosis not present

## 2019-06-21 ENCOUNTER — Other Ambulatory Visit: Payer: Self-pay

## 2019-06-21 ENCOUNTER — Encounter (HOSPITAL_COMMUNITY)
Admission: RE | Admit: 2019-06-21 | Discharge: 2019-06-21 | Disposition: A | Discharge status: Home / Self Care | Payer: 59 | Source: Ambulatory Visit | Attending: Interventional Cardiology | Admitting: Interventional Cardiology

## 2019-06-21 DIAGNOSIS — I2102 ST elevation (STEMI) myocardial infarction involving left anterior descending coronary artery: Secondary | ICD-10-CM | POA: Diagnosis not present

## 2019-06-21 DIAGNOSIS — Z955 Presence of coronary angioplasty implant and graft: Secondary | ICD-10-CM | POA: Diagnosis not present

## 2019-06-21 DIAGNOSIS — Z951 Presence of aortocoronary bypass graft: Secondary | ICD-10-CM | POA: Diagnosis not present

## 2019-06-26 ENCOUNTER — Other Ambulatory Visit: Payer: Self-pay

## 2019-06-26 ENCOUNTER — Encounter (HOSPITAL_COMMUNITY)
Admission: RE | Admit: 2019-06-26 | Discharge: 2019-06-26 | Disposition: A | Discharge status: Home / Self Care | Payer: 59 | Source: Ambulatory Visit | Attending: Interventional Cardiology | Admitting: Interventional Cardiology

## 2019-06-26 DIAGNOSIS — Z955 Presence of coronary angioplasty implant and graft: Secondary | ICD-10-CM | POA: Diagnosis not present

## 2019-06-26 DIAGNOSIS — I2102 ST elevation (STEMI) myocardial infarction involving left anterior descending coronary artery: Secondary | ICD-10-CM | POA: Diagnosis not present

## 2019-06-26 DIAGNOSIS — Z951 Presence of aortocoronary bypass graft: Secondary | ICD-10-CM

## 2019-06-28 ENCOUNTER — Other Ambulatory Visit: Payer: Self-pay

## 2019-06-28 ENCOUNTER — Encounter (HOSPITAL_COMMUNITY)
Admission: RE | Admit: 2019-06-28 | Discharge: 2019-06-28 | Disposition: A | Discharge status: Home / Self Care | Payer: 59 | Source: Ambulatory Visit | Attending: Interventional Cardiology | Admitting: Interventional Cardiology

## 2019-06-28 DIAGNOSIS — Z951 Presence of aortocoronary bypass graft: Secondary | ICD-10-CM | POA: Diagnosis not present

## 2019-06-28 DIAGNOSIS — Z955 Presence of coronary angioplasty implant and graft: Secondary | ICD-10-CM | POA: Diagnosis not present

## 2019-06-28 DIAGNOSIS — I2102 ST elevation (STEMI) myocardial infarction involving left anterior descending coronary artery: Secondary | ICD-10-CM

## 2019-07-01 ENCOUNTER — Other Ambulatory Visit: Payer: Self-pay

## 2019-07-01 ENCOUNTER — Encounter (HOSPITAL_COMMUNITY)
Admission: RE | Admit: 2019-07-01 | Discharge: 2019-07-01 | Disposition: A | Discharge status: Home / Self Care | Payer: 59 | Source: Ambulatory Visit | Attending: Interventional Cardiology | Admitting: Interventional Cardiology

## 2019-07-01 DIAGNOSIS — I2102 ST elevation (STEMI) myocardial infarction involving left anterior descending coronary artery: Secondary | ICD-10-CM

## 2019-07-01 DIAGNOSIS — Z951 Presence of aortocoronary bypass graft: Secondary | ICD-10-CM

## 2019-07-01 DIAGNOSIS — Z955 Presence of coronary angioplasty implant and graft: Secondary | ICD-10-CM

## 2019-07-03 ENCOUNTER — Other Ambulatory Visit: Payer: Self-pay

## 2019-07-03 ENCOUNTER — Encounter (HOSPITAL_COMMUNITY)
Admission: RE | Admit: 2019-07-03 | Discharge: 2019-07-03 | Disposition: A | Discharge status: Home / Self Care | Payer: 59 | Source: Ambulatory Visit | Attending: Interventional Cardiology | Admitting: Interventional Cardiology

## 2019-07-03 DIAGNOSIS — Z955 Presence of coronary angioplasty implant and graft: Secondary | ICD-10-CM

## 2019-07-03 DIAGNOSIS — Z951 Presence of aortocoronary bypass graft: Secondary | ICD-10-CM | POA: Diagnosis not present

## 2019-07-03 DIAGNOSIS — I2102 ST elevation (STEMI) myocardial infarction involving left anterior descending coronary artery: Secondary | ICD-10-CM

## 2019-07-04 NOTE — Progress Notes (Signed)
Cardiac Individual Treatment Plan  Patient Details  Name: [name] MRN: [id number] Date of Birth: [birth year] Referring Provider:     [place] from 06/04/2019 in [place]  Referring Provider  Dr. 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  aspirin 81 MG chewable tablet, Chew 1 tablet (81 mg total) by mouth daily., Disp: 30 tablet, Rfl: 0 .  atorvastatin (LIPITOR) 80 MG tablet, Take 1 tablet (80 mg total) by mouth daily at 6 PM., Disp: 90 tablet, Rfl: 3 .  loratadine (CLARITIN) 10 MG tablet, Take 10 mg by mouth daily as needed for allergies., Disp: , Rfl:  .  metoprolol tartrate (LOPRESSOR) 25 MG tablet, Take 0.5 tablets (12.5 mg total) by mouth 2 (two) times daily., Disp: 180 tablet, Rfl: 3 .  nitroGLYCERIN (NITROSTAT) 0.4 MG SL tablet, Place 1 tablet (0.4 mg total) under the tongue every 5 (five) minutes as needed for chest pain., Disp: 25 tablet, Rfl: 0  Past Medical History: Past Medical History:  Diagnosis Date  . Acute non-ST elevation myocardial infarction (NSTEMI) ([place]) 12/16/2018  . CAD S/P DES PCI -pLAD 12/16/2018   Cath 12/15/2017: Successful proximal LAD synergy 3.0 x 15 DES postdilated to 3.25 mm reducing the stenosis to 0% ; ? 60-70% ostial LAD noted on some images.  . Hyperlipidemia   . PONV (postoperative nausea and vomiting)     Tobacco Use: Social History   Tobacco Use  Smoking Status Never Smoker  Smokeless Tobacco Never Used    Labs: Recent Review Flowsheet Data    Labs for ITP Cardiac and Pulmonary Rehab  Latest Ref Rng & Units 04/08/2019 04/08/2019 04/08/2019 04/08/2019 04/08/2019   Cholestrol 100 - 199 mg/dL - - - - -   LDLCALC 0 - 99 mg/dL - - - - -   HDL >39 mg/dL - - - - -   Trlycerides 0 - 149 mg/dL - - - - -   Hemoglobin A1c 4.8 - 5.6 % - - - - -   PHART 7.350 - 7.450 7.475(H) 7.377 7.446 7.344(L) 7.335(L)   PCO2ART 32.0 - 48.0 mmHg 31.4(L) 38.4 28.6(L) 36.6 40.2   HCO3 20.0 - 28.0 mmol/L 23.1 22.6 20.1 19.9(L) 21.4   TCO2 22 - 32 mmol/L 24 24 21(L) 21(L) 23   ACIDBASEDEF 0.0 - 2.0 mmol/L - 2.0 4.0(H) 5.0(H) 4.0(H)   O2SAT % 100.0 99.0 99.0 98.0 100.0      Capillary Blood Glucose: Lab Results  Component Value Date   GLUCAP 120 (H) 04/21/2019   GLUCAP 86 04/11/2019   GLUCAP 74 04/11/2019   GLUCAP 96 04/11/2019   GLUCAP 105 (H) 04/11/2019     Exercise Target Goals: Exercise Program Goal: Individual exercise prescription set using results from initial 6 min walk test and THRR while considering  patient's activity barriers and safety.   Exercise Prescription Goal: Initial exercise prescription builds to 30-45 minutes a day of aerobic activity, 2-3 days per week.  Home exercise guidelines will be given to patient during program as part of exercise prescription that the  participant will acknowledge.  Activity Barriers & Risk Stratification: Activity Barriers & Cardiac Risk Stratification - 06/04/19 1534      Activity Barriers & Cardiac Risk Stratification   Activity Barriers  Deconditioning;Shortness of Breath;Incisional Pain    Cardiac Risk Stratification  High       6 Minute Walk: 6 Minute Walk    Row Name 06/04/19 1533         6 Minute Walk   Phase  Initial     Distance  1283 feet     Walk Time  6 minutes     # of Rest Breaks  0     MPH  2.4     METS  3.3     RPE  13     Perceived Dyspnea   1     VO2 Peak  11.89     Symptoms  Yes (comment)     Comments  Miold SOB     Resting HR  85 bpm     Resting BP  104/62     Resting Oxygen Saturation   100 %      Exercise Oxygen Saturation  during 6 min walk  98 %     Max Ex. HR  109 bpm     Max Ex. BP  122/68     2 Minute Post BP  102/62        Oxygen Initial Assessment:   Oxygen Re-Evaluation:   Oxygen Discharge (Final Oxygen Re-Evaluation):   Initial Exercise Prescription: Initial Exercise Prescription - 06/04/19 1500      Date of Initial Exercise RX and Referring Provider   Date  06/04/19    Referring Provider  Dr. Tamala Julian    Expected Discharge Date  07/19/19      Treadmill   MPH  2.2    Grade  0    Minutes  15      NuStep   Level  3    SPM  85    Minutes  15    METs  2.9      Prescription Details   Frequency (times per week)  3    Duration  Progress to 30 minutes of continuous aerobic without signs/symptoms of physical distress      Intensity   THRR 40-80% of Max Heartrate  68-136    Ratings of Perceived Exertion  11-13    Perceived Dyspnea  0-4      Progression   Progression  Continue to progress workloads to maintain intensity without signs/symptoms of physical distress.      Resistance Training   Training Prescription  Yes    Weight  3 lbs.     Reps  10-15       Perform Capillary Blood Glucose checks as needed.  Exercise Prescription Changes: Exercise Prescription Changes    Row Name 06/10/19 1101 07/01/19 1101 07/03/19 1100         Response to Exercise   Blood Pressure (Admit)  104/72  118/78  118/64     Blood Pressure (Exercise)  118/78  128/66  118/64     Blood Pressure (Exit)  100/80  110/76  108/66     Heart Rate (Admit)  96 bpm  84 bpm  97 bpm     Heart Rate (Exercise)  112 bpm  115 bpm  121 bpm     Heart Rate (Exit)  94 bpm  81 bpm  86 bpm     Rating of Perceived Exertion (  Exercise)  _0 Symptoms  none  none  none     Comments  Pt tolerated 1st exercise well.  -  -     Duration  Continue with 30 min of aerobic exercise without signs/symptoms of physical distress.  Continue with 30 min of aerobic exercise without signs/symptoms of  physical distress.  Continue with 30 min of aerobic exercise without signs/symptoms of physical distress.     Intensity  THRR unchanged  THRR unchanged  THRR unchanged       Progression   Progression  Continue to progress workloads to maintain intensity without signs/symptoms of physical distress.  Continue to progress workloads to maintain intensity without signs/symptoms of physical distress.  Continue to progress workloads to maintain intensity without signs/symptoms of physical distress.     Average METs  2.3  -  2.7       Resistance Training   Training Prescription  Yes  Yes  No Relaxation day, no weights.     Weight  3 lbs.   4lbs  -     Reps  10-15  10-15  -     Time  10 Minutes  10 Minutes  -       Interval Training   Interval Training  No  No  No       Treadmill   MPH  2.2  2.3  2.3     Grade  0  1  1     Minutes  _1 METs  2.69  3.08  3.08       NuStep   Level  _2 SPM  85  85  85     Minutes  _3 METs  2  - Didn't report MET avg  2.4       Home Exercise Plan   Plans to continue exercise at  -  Home (comment) Walking  Home (comment) Walking     Frequency  -  Add 4 additional days to program exercise sessions.  Add 4 additional days to program exercise sessions.     Initial Home Exercises Provided  -  06/17/19  06/17/19        Exercise Comments: Exercise Comments    Row Name 06/10/19 1156 06/17/19 1126 07/01/19 1120       Exercise Comments  Patient tolerated 1st session of exercise well without symptoms.  Reviewed home exercise guidelines and goals with patient.  Reviewed METs and goals with patient.        Exercise Goals and Review: Exercise Goals    Row Name 06/04/19 1536             Exercise Goals   Increase Physical Activity  Yes       Intervention  Provide advice, education, support and counseling about physical activity/exercise needs.;Develop an individualized exercise prescription for aerobic and resistive training  based on initial evaluation findings, risk stratification, comorbidities and participant's personal goals.       Expected Outcomes  Short Term: Attend rehab on a regular basis to increase amount of physical activity.;Long Term: Add in home exercise to make exercise part of routine and to increase amount of physical activity.;Long Term: Exercising regularly at least 3-5 days a week.       Increase Strength and Stamina  Yes  Intervention  Provide advice, education, support and counseling about physical activity/exercise needs.;Develop an individualized exercise prescription for aerobic and resistive training based on initial evaluation findings, risk stratification, comorbidities and participant's personal goals.       Expected Outcomes  Short Term: Increase workloads from initial exercise prescription for resistance, speed, and METs.;Short Term: Perform resistance training exercises routinely during rehab and add in resistance training at home;Long Term: Improve cardiorespiratory fitness, muscular endurance and strength as measured by increased METs and functional capacity (6MWT)       Able to understand and use rate of perceived exertion (RPE) scale  Yes       Intervention  Provide education and explanation on how to use RPE scale       Expected Outcomes  Short Term: Able to use RPE daily in rehab to express subjective intensity level;Long Term:  Able to use RPE to guide intensity level when exercising independently       Knowledge and understanding of Target Heart Rate Range (THRR)  Yes       Intervention  Provide education and explanation of THRR including how the numbers were predicted and where they are located for reference       Expected Outcomes  Short Term: Able to state/look up THRR;Long Term: Able to use THRR to govern intensity when exercising independently;Short Term: Able to use daily as guideline for intensity in rehab       Able to check pulse independently  Yes       Intervention   Provide education and demonstration on how to check pulse in carotid and radial arteries.;Review the importance of being able to check your own pulse for safety during independent exercise       Expected Outcomes  Short Term: Able to explain why pulse checking is important during independent exercise;Long Term: Able to check pulse independently and accurately       Understanding of Exercise Prescription  Yes       Intervention  Provide education, explanation, and written materials on patient's individual exercise prescription       Expected Outcomes  Short Term: Able to explain program exercise prescription;Long Term: Able to explain home exercise prescription to exercise independently          Exercise Goals Re-Evaluation : Exercise Goals Re-Evaluation    Row Name 06/10/19 1156 06/17/19 1126 07/01/19 1120         Exercise Goal Re-Evaluation   Exercise Goals Review  Increase Physical Activity;Able to understand and use rate of perceived exertion (RPE) scale;Increase Strength and Stamina  Increase Physical Activity;Able to understand and use rate of perceived exertion (RPE) scale;Increase Strength and Stamina;Understanding of Exercise Prescription;Knowledge and understanding of Target Heart Rate Range (THRR);Able to check pulse independently  Increase Physical Activity;Able to understand and use rate of perceived exertion (RPE) scale;Increase Strength and Stamina;Understanding of Exercise Prescription;Knowledge and understanding of Target Heart Rate Range (THRR);Able to check pulse independently     Comments  Patient tolerated 1st session of exercise well. Patient able to understand and use the RPE scale appropriately.  Reviewed home exercise guidelines with patient including THRR, RPE scale, and endpoints for exercise. Pt knows how to check her pulse independently. Pt is stretching and walking daily at least 60 minutes with rest breaks taken as needed. Pt also has a treadmill at home that she uses  when the weather doesn't permit exercise outdoors.  Patient is doing well with walking at home, but still a little "worn out"  going up hills. Pt feels she's "halfway to her goal" which is to have no SOB, no CP, and to be able to walk faster.     Expected Outcomes  Progress workloads as tolerated to help increase strength and stamina.  Progress workloads as tolerated to help improve strength and stamina.  Increase speed and incline on treadmill to help build stamina for walking.        Discharge Exercise Prescription (Final Exercise Prescription Changes): Exercise Prescription Changes - 07/03/19 1100      Response to Exercise   Blood Pressure (Admit)  118/64    Blood Pressure (Exercise)  118/64    Blood Pressure (Exit)  108/66    Heart Rate (Admit)  97 bpm    Heart Rate (Exercise)  121 bpm    Heart Rate (Exit)  86 bpm    Rating of Perceived Exertion (Exercise)  12    Symptoms  none    Duration  Continue with 30 min of aerobic exercise without signs/symptoms of physical distress.    Intensity  THRR unchanged      Progression   Progression  Continue to progress workloads to maintain intensity without signs/symptoms of physical distress.    Average METs  2.7      Resistance Training   Training Prescription  No   Relaxation day, no weights.     Interval Training   Interval Training  No      Treadmill   MPH  2.3    Grade  1    Minutes  15    METs  3.08      NuStep   Level  4    SPM  85    Minutes  15    METs  2.4      Home Exercise Plan   Plans to continue exercise at  Home (comment)   Walking   Frequency  Add 4 additional days to program exercise sessions.    Initial Home Exercises Provided  06/17/19       Nutrition:  Target Goals: Understanding of nutrition guidelines, daily intake of sodium <1531m, cholesterol <2049m calories 30% from fat and 7% or less from saturated fats, daily to have 5 or more servings of fruits and vegetables.  Biometrics: Pre Biometrics -  06/04/19 1537      Pre Biometrics   Height  4' 11" (1.499 m)    Weight  82.4 kg    Waist Circumference  40 inches    Hip Circumference  45 inches    Waist to Hip Ratio  0.89 %    BMI (Calculated)  36.67    Triceps Skinfold  42 mm    % Body Fat  47.6 %    Grip Strength  29 kg    Flexibility  15 in    Single Leg Stand  12.81 seconds        Nutrition Therapy Plan and Nutrition Goals:   Nutrition Assessments:   Nutrition Goals Re-Evaluation:   Nutrition Goals Re-Evaluation:   Nutrition Goals Discharge (Final Nutrition Goals Re-Evaluation):   Psychosocial: Target Goals: Acknowledge presence or absence of significant depression and/or stress, maximize coping skills, provide positive support system. Participant is able to verbalize types and ability to use techniques and skills needed for reducing stress and depression.  Initial Review & Psychosocial Screening: Initial Psych Review & Screening - 06/04/19 1505      Initial Review   Current issues with  Current Stress Concerns  Source of Stress Concerns  Unable to participate in former interests or hobbies    Comments  Zamyiah has not been able to do the things she normally participated in before her cardiac event      [place]?  Yes   Briselda has her husband for support     Barriers   Psychosocial barriers to participate in program  The patient should benefit from training in stress management and relaxation.      Screening Interventions   Interventions  Encouraged to exercise;To provide support and resources with identified psychosocial needs       Quality of Life Scores: Quality of Life - 06/04/19 1612      Quality of Life Scores   Health/Function Pre  15.03 %    Socioeconomic Pre  25.64 %    Psych/Spiritual Pre  26.14 %    Family Pre  24 %    GLOBAL Pre  20.82 %      Scores of 19 and below usually indicate a poorer quality of life in these areas.  A difference of  2-3 points  is a clinically meaningful difference.  A difference of 2-3 points in the total score of the Quality of Life Index has been associated with significant improvement in overall quality of life, self-image, physical symptoms, and general health in studies assessing change in quality of life.  PHQ-9: Recent Review Flowsheet Data    Depression screen [facility] 2/9 06/04/2019   Decreased Interest 0   Down, Depressed, Hopeless 0   PHQ - 2 Score 0     Interpretation of Total Score  Total Score Depression Severity:  1-4 = Minimal depression, 5-9 = Mild depression, 10-14 = Moderate depression, 15-19 = Moderately severe depression, 20-27 = Severe depression   Psychosocial Evaluation and Intervention: Psychosocial Evaluation - 06/10/19 1354      Psychosocial Evaluation & Interventions   Interventions  Stress management education;Encouraged to exercise with the program and follow exercise prescription;Relaxation education    Comments  Romelle has been unable to participate in former activities.  Encourgaed pt to continue exercising at [facility].  Carolie enjoys participating in [employer].    Expected Outcomes  Jaslynne will report decreased stress and maintain a positive outlook.    Continue Psychosocial Services   Follow up required by staff       Psychosocial Re-Evaluation: Psychosocial Re-Evaluation    [place] Name 07/03/19 1659             Psychosocial Re-Evaluation   Current issues with  Current Stress Concerns       Comments  Deshunda and this RN had a long discussion about her recovery and comparison to friends who do not have any CV issues.  I encouraged pt to give herself some grace and decrease the unrealistic expectations she has established for her recovery.       Expected Outcomes  Angie will establish realistic expectations for her recovery and maintain a positive outlook.       Interventions  Stress management education;Relaxation education;Encouraged to attend Cardiac Rehabilitation for the  exercise       Continue Psychosocial Services   Follow up required by staff          Psychosocial Discharge (Final Psychosocial Re-Evaluation): Psychosocial Re-Evaluation - 07/03/19 1659      Psychosocial Re-Evaluation   Current issues with  Current Stress Concerns    Comments  Pema and this RN had a long discussion  about her recovery and comparison to friends who do not have any CV issues.  I encouraged pt to give herself some grace and decrease the unrealistic expectations she has established for her recovery.    Expected Outcomes  Angie will establish realistic expectations for her recovery and maintain a positive outlook.    Interventions  Stress management education;Relaxation education;Encouraged to attend Cardiac Rehabilitation for the exercise    Continue Psychosocial Services   Follow up required by staff       Vocational Rehabilitation: Provide vocational rehab assistance to qualifying candidates.   Vocational Rehab Evaluation & Intervention: Vocational Rehab - 06/04/19 1507      Initial Vocational Rehab Evaluation & Intervention   Assessment shows need for Vocational Rehabilitation  No      Vocational Rehab Re-Evaulation   Comments  Jazmen is currently employed and does not need vocational rehab at this time.       Education: Education Goals: Education classes will be provided on a weekly basis, covering required topics. Participant will state understanding/return demonstration of topics presented.  Learning Barriers/Preferences: Learning Barriers/Preferences - 06/04/19 1542      Learning Barriers/Preferences   Learning Barriers  None    Learning Preferences  Written Material;Skilled Demonstration;Pictoral;Video       Education Topics: Count Your Pulse:  -Group instruction provided by verbal instruction, demonstration, patient participation and written materials to support subject.  Instructors address importance of being able to find your pulse and how to  count your pulse when at home without a heart monitor.  Patients get hands on experience counting their pulse with staff help and individually.   Heart Attack, Angina, and Risk Factor Modification:  -Group instruction provided by verbal instruction, video, and written materials to support subject.  Instructors address signs and symptoms of angina and heart attacks.    Also discuss risk factors for heart disease and how to make changes to improve heart health risk factors.   Functional Fitness:  -Group instruction provided by verbal instruction, demonstration, patient participation, and written materials to support subject.  Instructors address safety measures for doing things around the house.  Discuss how to get up and down off the floor, how to pick things up properly, how to safely get out of a chair without assistance, and balance training.   Meditation and Mindfulness:  -Group instruction provided by verbal instruction, patient participation, and written materials to support subject.  Instructor addresses importance of mindfulness and meditation practice to help reduce stress and improve awareness.  Instructor also leads participants through a meditation exercise.    Stretching for Flexibility and Mobility:  -Group instruction provided by verbal instruction, patient participation, and written materials to support subject.  Instructors lead participants through series of stretches that are designed to increase flexibility thus improving mobility.  These stretches are additional exercise for major muscle groups that are typically performed during regular warm up and cool down.   Hands Only CPR:  -Group verbal, video, and participation provides a basic overview of AHA guidelines for community CPR. Role-play of emergencies allow participants the opportunity to practice calling for help and chest compression technique with discussion of AED use.   Hypertension: -Group verbal and written  instruction that provides a basic overview of hypertension including the most recent diagnostic guidelines, risk factor reduction with self-care instructions and medication management.    Nutrition I class: Heart Healthy Eating:  -Group instruction provided by PowerPoint slides, verbal discussion, and written materials to support subject matter.  The instructor gives an explanation and review of the Therapeutic Lifestyle Changes diet recommendations, which includes a discussion on lipid goals, dietary fat, sodium, fiber, plant stanol/sterol esters, sugar, and the components of a well-balanced, healthy diet.   Nutrition II class: Lifestyle Skills:  -Group instruction provided by PowerPoint slides, verbal discussion, and written materials to support subject matter. The instructor gives an explanation and review of label reading, grocery shopping for heart health, heart healthy recipe modifications, and ways to make healthier choices when eating out.   Diabetes Question & Answer:  -Group instruction provided by PowerPoint slides, verbal discussion, and written materials to support subject matter. The instructor gives an explanation and review of diabetes co-morbidities, pre- and post-prandial blood glucose goals, pre-exercise blood glucose goals, signs, symptoms, and treatment of hypoglycemia and hyperglycemia, and foot care basics.   Diabetes Blitz:  -Group instruction provided by PowerPoint slides, verbal discussion, and written materials to support subject matter. The instructor gives an explanation and review of the physiology behind type 1 and type 2 diabetes, diabetes medications and rational behind using different medications, pre- and post-prandial blood glucose recommendations and Hemoglobin A1c goals, diabetes diet, and exercise including blood glucose guidelines for exercising safely.    Portion Distortion:  -Group instruction provided by PowerPoint slides, verbal discussion, written  materials, and food models to support subject matter. The instructor gives an explanation of serving size versus portion size, changes in portions sizes over the last 20 years, and what consists of a serving from each food group.   Stress Management:  -Group instruction provided by verbal instruction, video, and written materials to support subject matter.  Instructors review role of stress in heart disease and how to cope with stress positively.     Exercising on Your Own:  -Group instruction provided by verbal instruction, power point, and written materials to support subject.  Instructors discuss benefits of exercise, components of exercise, frequency and intensity of exercise, and end points for exercise.  Also discuss use of nitroglycerin and activating EMS.  Review options of places to exercise outside of rehab.  Review guidelines for sex with heart disease.   Cardiac Drugs I:  -Group instruction provided by verbal instruction and written materials to support subject.  Instructor reviews cardiac drug classes: antiplatelets, anticoagulants, beta blockers, and statins.  Instructor discusses reasons, side effects, and lifestyle considerations for each drug class.   Cardiac Drugs II:  -Group instruction provided by verbal instruction and written materials to support subject.  Instructor reviews cardiac drug classes: angiotensin converting enzyme inhibitors (ACE-I), angiotensin II receptor blockers (ARBs), nitrates, and calcium channel blockers.  Instructor discusses reasons, side effects, and lifestyle considerations for each drug class.   Anatomy and Physiology of the Circulatory System:  Group verbal and written instruction and models provide basic cardiac anatomy and physiology, with the coronary electrical and arterial systems. Review of: AMI, Angina, Valve disease, Heart Failure, Peripheral Artery Disease, Cardiac Arrhythmia, Pacemakers, and the ICD.   Other Education:  -Group or  individual verbal, written, or video instructions that support the educational goals of the cardiac rehab program.   Holiday Eating Survival Tips:  -Group instruction provided by PowerPoint slides, verbal discussion, and written materials to support subject matter. The instructor gives patients tips, tricks, and techniques to help them not only survive but enjoy the holidays despite the onslaught of food that accompanies the holidays.   Knowledge Questionnaire Score: Knowledge Questionnaire Score - 06/04/19 1542      Knowledge Questionnaire Score  Pre Score  23/24       Core Components/Risk Factors/Patient Goals at Admission: Personal Goals and Risk Factors at Admission - 06/04/19 1609      Core Components/Risk Factors/Patient Goals on Admission    Weight Management  Yes;Obesity;Weight Maintenance;Weight Loss    Intervention  Weight Management: Provide education and appropriate resources to help participant work on and attain dietary goals.;Weight Management/Obesity: Establish reasonable short term and long term weight goals.;Obesity: Provide education and appropriate resources to help participant work on and attain dietary goals.    Admit Weight  181 lb 10.5 oz (82.4 kg)    Lipids  Yes    Intervention  Provide education and support for participant on nutrition & aerobic/resistive exercise along with prescribed medications to achieve LDL <79m, HDL >477m    Expected Outcomes  Short Term: Participant states understanding of desired cholesterol values and is compliant with medications prescribed. Participant is following exercise prescription and nutrition guidelines.;Long Term: Cholesterol controlled with medications as prescribed, with individualized exercise RX and with personalized nutrition plan. Value goals: LDL < 709mHDL > 40 mg.    Stress  Yes    Intervention  Offer individual and/or small group education and counseling on adjustment to heart disease, stress management and  health-related lifestyle change. Teach and support self-help strategies.;Refer participants experiencing significant psychosocial distress to appropriate mental health specialists for further evaluation and treatment. When possible, include family members and significant others in education/counseling sessions.    Expected Outcomes  Short Term: Participant demonstrates changes in health-related behavior, relaxation and other stress management skills, ability to obtain effective social support, and compliance with psychotropic medications if prescribed.;Long Term: Emotional wellbeing is indicated by absence of clinically significant psychosocial distress or social isolation.       Core Components/Risk Factors/Patient Goals Review:  Goals and Risk Factor Review    Row Name 06/10/19 1400 07/03/19 1653           Core Components/Risk Factors/Patient Goals Review   Personal Goals Review  Weight Management/Obesity;Hypertension;Lipids;Stress  Weight Management/Obesity;Hypertension;Lipids;Stress      Review  Pt with multiple CAD RFs willing to participate in CR.  AngJerrieuld like to walk two miles without chest tightness.  Pt with multiple CAD RFs willing to participate in CR.  Angie continues to do well with exercise.      Expected Outcomes  Pt will continue to participate in CR exercise, nutrition, and lifestyle modification opportunities.  Pt will continue to participate in CR exercise, nutrition, and lifestyle modification opportunities.         Core Components/Risk Factors/Patient Goals at Discharge (Final Review):  Goals and Risk Factor Review - 07/03/19 1653      Core Components/Risk Factors/Patient Goals Review   Personal Goals Review  Weight Management/Obesity;Hypertension;Lipids;Stress    Review  Pt with multiple CAD RFs willing to participate in CR.  Angie continues to do well with exercise.    Expected Outcomes  Pt will continue to participate in CR exercise, nutrition, and lifestyle  modification opportunities.       ITP Comments: ITP Comments    Row Name 06/04/19 1503 06/10/19 1353 07/03/19 1652       ITP Comments  Dr TraFransico Him, Medical Director  Pt started exercise and tolerated it well.  30 Day ITP Review. Angie continues to tolerate exercise well.  She feels that she is increasing her motivation level slowly.  She is planning to restart working and was able to switch class times to  continue CR.        Comments: See ITP Comments.

## 2019-07-05 ENCOUNTER — Encounter (HOSPITAL_COMMUNITY)
Admission: RE | Admit: 2019-07-05 | Discharge: 2019-07-05 | Disposition: A | Discharge status: Home / Self Care | Payer: 59 | Source: Ambulatory Visit | Attending: Interventional Cardiology | Admitting: Interventional Cardiology

## 2019-07-05 ENCOUNTER — Other Ambulatory Visit: Payer: Self-pay

## 2019-07-05 DIAGNOSIS — I2102 ST elevation (STEMI) myocardial infarction involving left anterior descending coronary artery: Secondary | ICD-10-CM | POA: Diagnosis not present

## 2019-07-05 DIAGNOSIS — Z955 Presence of coronary angioplasty implant and graft: Secondary | ICD-10-CM | POA: Diagnosis not present

## 2019-07-05 DIAGNOSIS — Z951 Presence of aortocoronary bypass graft: Secondary | ICD-10-CM

## 2019-07-08 ENCOUNTER — Encounter (HOSPITAL_COMMUNITY): Payer: 59

## 2019-07-08 ENCOUNTER — Telehealth: Payer: Self-pay

## 2019-07-08 ENCOUNTER — Telehealth: Payer: Self-pay | Admitting: Interventional Cardiology

## 2019-07-08 NOTE — Telephone Encounter (Signed)
New Message    Patient wants to discuss returning to work after cardiac rehab please call patient back to advise.

## 2019-07-08 NOTE — Telephone Encounter (Signed)
Called and spoke to patient. Made her aware that Dr. Tamala Julian has written a letter for her to return to work today with July 08, 2019 with restricted hours for 2 weeks then progressing to full time. Weight and lifting restrictions should be < 15 lbs. for the the first month. Patient requested to return to work tomorrow since she is just now getting the message. Letter sent to her MyChart with return to work date for tomorrow. Patient appreciative of the call.

## 2019-07-08 NOTE — Telephone Encounter (Signed)
Pt to call and see if her new PCP Dr. Sueanne Margarita can send her most recent labs to [place].. she thinks he drew a lipid and hepatic panel.. if not, she will call and come in prior to her 08/15/19 appt with Dr. Tamala Julian and have drawn at our office.   No orders placed yet in Epic.  Will forward to med rec to help in obtaining the labs.[name]

## 2019-07-08 NOTE — Telephone Encounter (Signed)
-----   Message from Loren Racer, LPN sent at [phone]  1:16 PM EDT ----- Regarding: Lipid/Liver Oct 2020 Needs Lipid, liver late October

## 2019-07-10 ENCOUNTER — Encounter (HOSPITAL_COMMUNITY)
Admission: RE | Admit: 2019-07-10 | Discharge: 2019-07-10 | Disposition: A | Discharge status: Home / Self Care | Payer: 59 | Source: Ambulatory Visit | Attending: Interventional Cardiology | Admitting: Interventional Cardiology

## 2019-07-10 ENCOUNTER — Encounter (HOSPITAL_COMMUNITY): Payer: 59

## 2019-07-10 ENCOUNTER — Other Ambulatory Visit: Payer: Self-pay

## 2019-07-10 DIAGNOSIS — Z951 Presence of aortocoronary bypass graft: Secondary | ICD-10-CM

## 2019-07-10 DIAGNOSIS — Z955 Presence of coronary angioplasty implant and graft: Secondary | ICD-10-CM

## 2019-07-10 DIAGNOSIS — I2102 ST elevation (STEMI) myocardial infarction involving left anterior descending coronary artery: Secondary | ICD-10-CM | POA: Diagnosis not present

## 2019-07-12 ENCOUNTER — Other Ambulatory Visit: Payer: Self-pay

## 2019-07-12 ENCOUNTER — Encounter (HOSPITAL_COMMUNITY)
Admission: RE | Admit: 2019-07-12 | Discharge: 2019-07-12 | Disposition: A | Discharge status: Home / Self Care | Payer: 59 | Source: Ambulatory Visit | Attending: Interventional Cardiology | Admitting: Interventional Cardiology

## 2019-07-12 ENCOUNTER — Encounter (HOSPITAL_COMMUNITY): Payer: 59

## 2019-07-12 DIAGNOSIS — Z955 Presence of coronary angioplasty implant and graft: Secondary | ICD-10-CM

## 2019-07-12 DIAGNOSIS — Z951 Presence of aortocoronary bypass graft: Secondary | ICD-10-CM

## 2019-07-12 DIAGNOSIS — I2102 ST elevation (STEMI) myocardial infarction involving left anterior descending coronary artery: Secondary | ICD-10-CM | POA: Diagnosis not present

## 2019-07-15 ENCOUNTER — Encounter (HOSPITAL_COMMUNITY): Payer: 59

## 2019-07-15 ENCOUNTER — Encounter (HOSPITAL_COMMUNITY)
Admission: RE | Admit: 2019-07-15 | Discharge: 2019-07-15 | Disposition: A | Discharge status: Home / Self Care | Payer: 59 | Source: Ambulatory Visit | Attending: Interventional Cardiology | Admitting: Interventional Cardiology

## 2019-07-15 ENCOUNTER — Other Ambulatory Visit: Payer: Self-pay

## 2019-07-15 DIAGNOSIS — Z951 Presence of aortocoronary bypass graft: Secondary | ICD-10-CM

## 2019-07-15 DIAGNOSIS — Z955 Presence of coronary angioplasty implant and graft: Secondary | ICD-10-CM | POA: Diagnosis not present

## 2019-07-15 DIAGNOSIS — I2102 ST elevation (STEMI) myocardial infarction involving left anterior descending coronary artery: Secondary | ICD-10-CM

## 2019-07-17 ENCOUNTER — Other Ambulatory Visit: Payer: Self-pay

## 2019-07-17 ENCOUNTER — Encounter (HOSPITAL_COMMUNITY): Payer: 59

## 2019-07-17 ENCOUNTER — Encounter (HOSPITAL_COMMUNITY)
Admission: RE | Admit: 2019-07-17 | Discharge: 2019-07-17 | Disposition: A | Discharge status: Home / Self Care | Payer: 59 | Source: Ambulatory Visit | Attending: Interventional Cardiology | Admitting: Interventional Cardiology

## 2019-07-17 DIAGNOSIS — I2102 ST elevation (STEMI) myocardial infarction involving left anterior descending coronary artery: Secondary | ICD-10-CM

## 2019-07-17 DIAGNOSIS — Z955 Presence of coronary angioplasty implant and graft: Secondary | ICD-10-CM

## 2019-07-17 DIAGNOSIS — Z951 Presence of aortocoronary bypass graft: Secondary | ICD-10-CM

## 2019-07-17 NOTE — Progress Notes (Signed)
[name] is concerned that her resting heart rate has been too high. Typically [name]'s resting heart rate been between the mid 70's to 99. Sinus Rhythm. [name]'s vital signs have been stable.Will fax exercise flow sheets to Dr. Thompson Caul office for review. 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

## 2019-07-18 MED FILL — ATORVASTATIN 80 MG TABLET: 80 | 90 days supply | Qty: 90 | Fill #2

## 2019-07-19 ENCOUNTER — Other Ambulatory Visit: Payer: Self-pay | Admitting: Interventional Cardiology

## 2019-07-19 ENCOUNTER — Other Ambulatory Visit: Payer: Self-pay

## 2019-07-19 ENCOUNTER — Encounter (HOSPITAL_COMMUNITY): Payer: 59

## 2019-07-19 ENCOUNTER — Encounter (HOSPITAL_COMMUNITY)
Admission: RE | Admit: 2019-07-19 | Discharge: 2019-07-19 | Disposition: A | Discharge status: Home / Self Care | Payer: 59 | Source: Ambulatory Visit | Attending: Interventional Cardiology | Admitting: Interventional Cardiology

## 2019-07-19 DIAGNOSIS — I2102 ST elevation (STEMI) myocardial infarction involving left anterior descending coronary artery: Secondary | ICD-10-CM | POA: Diagnosis not present

## 2019-07-19 DIAGNOSIS — Z955 Presence of coronary angioplasty implant and graft: Secondary | ICD-10-CM | POA: Insufficient documentation

## 2019-07-19 DIAGNOSIS — Z951 Presence of aortocoronary bypass graft: Secondary | ICD-10-CM | POA: Diagnosis not present

## 2019-07-19 MED FILL — NITROGLYCERIN 0.4 MG TAB SL: 0.4 | 10 days supply | Qty: 25 | Fill #0

## 2019-07-22 ENCOUNTER — Encounter (HOSPITAL_COMMUNITY)
Admission: RE | Admit: 2019-07-22 | Discharge: 2019-07-22 | Disposition: A | Discharge status: Home / Self Care | Payer: 59 | Source: Ambulatory Visit | Attending: Interventional Cardiology | Admitting: Interventional Cardiology

## 2019-07-22 ENCOUNTER — Other Ambulatory Visit: Payer: Self-pay

## 2019-07-22 DIAGNOSIS — Z951 Presence of aortocoronary bypass graft: Secondary | ICD-10-CM

## 2019-07-22 DIAGNOSIS — Z955 Presence of coronary angioplasty implant and graft: Secondary | ICD-10-CM

## 2019-07-22 DIAGNOSIS — I2102 ST elevation (STEMI) myocardial infarction involving left anterior descending coronary artery: Secondary | ICD-10-CM | POA: Diagnosis not present

## 2019-07-24 ENCOUNTER — Other Ambulatory Visit: Payer: Self-pay

## 2019-07-24 ENCOUNTER — Encounter (HOSPITAL_COMMUNITY)
Admission: RE | Admit: 2019-07-24 | Discharge: 2019-07-24 | Disposition: A | Discharge status: Home / Self Care | Payer: 59 | Source: Ambulatory Visit | Attending: Interventional Cardiology | Admitting: Interventional Cardiology

## 2019-07-24 DIAGNOSIS — Z955 Presence of coronary angioplasty implant and graft: Secondary | ICD-10-CM

## 2019-07-24 DIAGNOSIS — I2102 ST elevation (STEMI) myocardial infarction involving left anterior descending coronary artery: Secondary | ICD-10-CM

## 2019-07-24 DIAGNOSIS — Z951 Presence of aortocoronary bypass graft: Secondary | ICD-10-CM

## 2019-07-26 ENCOUNTER — Telehealth: Payer: Self-pay | Admitting: Cardiology

## 2019-07-26 ENCOUNTER — Encounter (HOSPITAL_COMMUNITY)
Admission: RE | Admit: 2019-07-26 | Discharge: 2019-07-26 | Disposition: A | Discharge status: Home / Self Care | Payer: 59 | Source: Ambulatory Visit | Attending: Interventional Cardiology | Admitting: Interventional Cardiology

## 2019-07-26 ENCOUNTER — Other Ambulatory Visit: Payer: Self-pay

## 2019-07-26 DIAGNOSIS — I2102 ST elevation (STEMI) myocardial infarction involving left anterior descending coronary artery: Secondary | ICD-10-CM

## 2019-07-26 DIAGNOSIS — Z955 Presence of coronary angioplasty implant and graft: Secondary | ICD-10-CM | POA: Diagnosis not present

## 2019-07-26 DIAGNOSIS — Z951 Presence of aortocoronary bypass graft: Secondary | ICD-10-CM

## 2019-07-26 NOTE — Progress Notes (Signed)
Patient reported having chest discomfort at the end of her walk test this afternoon. [name] rated the discomfort a 2/10 on a 1-10 scale.Blood pressure 140/80. Max heart rate noted at 138. Telemetry rhythm Sinus tach Max heart rate 138. Chest discomfort went away after [name] completed her walk test. [name] said she noticed a little discomfort when she was walking up the hill with family yesterday. Taylor Kicks NP called and notified. No new order received. [name] said for [name] to call Dr Thompson Caul office if she has a reoccurrence of chest discomfort. Patient states understanding and completed  exercise without difficulty. Will continue to monitor the patient throughout  the program.Taylor Cutrona Venetia Maxon, RN,BSN 07/26/2019 4:38 PM

## 2019-07-26 NOTE — Telephone Encounter (Signed)
Pt was at cardiac rehab and was pushing herself and had mild chest discomfort.  Went away quickly.  On occ she may have when pushing herself.  Her CABG 04/08/19 with LIMA to LAD and VG to diag.   She is to see Dr. Tamala Julian 08/15/19 and will call if increased issues prior to that time.

## 2019-07-27 NOTE — Telephone Encounter (Signed)
No recommendations.

## 2019-07-29 ENCOUNTER — Other Ambulatory Visit: Payer: Self-pay

## 2019-07-29 ENCOUNTER — Encounter (HOSPITAL_COMMUNITY)
Admission: RE | Admit: 2019-07-29 | Discharge: 2019-07-29 | Disposition: A | Discharge status: Home / Self Care | Payer: 59 | Source: Ambulatory Visit | Attending: Interventional Cardiology | Admitting: Interventional Cardiology

## 2019-07-29 DIAGNOSIS — Z951 Presence of aortocoronary bypass graft: Secondary | ICD-10-CM

## 2019-07-29 DIAGNOSIS — Z955 Presence of coronary angioplasty implant and graft: Secondary | ICD-10-CM

## 2019-07-29 DIAGNOSIS — I2102 ST elevation (STEMI) myocardial infarction involving left anterior descending coronary artery: Secondary | ICD-10-CM

## 2019-07-29 NOTE — Progress Notes (Signed)
Incomplete Session Note  Patient Details  Name: [name] MRN: [id number] Date of Birth: [birth year] Referring Provider:     CARDIAC REHAB PHASE II ORIENTATION from 06/04/2019 in [place]  Referring Provider  Dr. Haywood Pao did not complete her rehab session. [name] reports chest pressure of 6/10 while walking on her home treadmill yesterday. This discomfort lasted 10 min before she discontinued exercise. Pressure went away with rest. She attempted to walk on treadmill again with chest pressure 4/10 recurring that subsided with rest. [name] is unsure if it was related to foods eaten earlier in the day. She also had substernal chest pressure during her 6 min walk test 3 days ago. MD was notified and request patient be seen in office if reoccurred. Patient's vitals stable and she denies CP/Pressure currently. She is instructed to call for appointment ASAP with cardiologist and not allowed to exercise today. Patient verbalizes understanding. Will continue to follow patient per EMR. Fahim Kats E. Rollene Rotunda, RN BSN

## 2019-07-31 ENCOUNTER — Ambulatory Visit (INDEPENDENT_AMBULATORY_CARE_PROVIDER_SITE_OTHER): Payer: 59 | Admitting: Interventional Cardiology

## 2019-07-31 ENCOUNTER — Encounter: Payer: Self-pay | Admitting: Interventional Cardiology

## 2019-07-31 ENCOUNTER — Other Ambulatory Visit: Payer: Self-pay

## 2019-07-31 ENCOUNTER — Encounter (HOSPITAL_COMMUNITY)
Admission: RE | Admit: 2019-07-31 | Discharge: 2019-07-31 | Disposition: A | Discharge status: Home / Self Care | Payer: 59 | Source: Ambulatory Visit | Attending: Interventional Cardiology | Admitting: Interventional Cardiology

## 2019-07-31 VITALS — BP 124/84 | HR 68 | Ht 59.0 in | Wt 180.0 lb

## 2019-07-31 DIAGNOSIS — Z955 Presence of coronary angioplasty implant and graft: Secondary | ICD-10-CM | POA: Diagnosis not present

## 2019-07-31 DIAGNOSIS — I319 Disease of pericardium, unspecified: Secondary | ICD-10-CM

## 2019-07-31 DIAGNOSIS — I10 Essential (primary) hypertension: Secondary | ICD-10-CM

## 2019-07-31 DIAGNOSIS — Z951 Presence of aortocoronary bypass graft: Secondary | ICD-10-CM

## 2019-07-31 DIAGNOSIS — I2102 ST elevation (STEMI) myocardial infarction involving left anterior descending coronary artery: Secondary | ICD-10-CM | POA: Diagnosis not present

## 2019-07-31 DIAGNOSIS — Z7189 Other specified counseling: Secondary | ICD-10-CM | POA: Diagnosis not present

## 2019-07-31 DIAGNOSIS — E785 Hyperlipidemia, unspecified: Secondary | ICD-10-CM

## 2019-07-31 MED ORDER — METOPROLOL TARTRATE 25 MG PO TABS: 25.0000 mg | ORAL_TABLET | Freq: Two times a day (BID) | ORAL | 3 refills | Status: DC

## 2019-07-31 NOTE — Patient Instructions (Signed)
Medication Instructions:  1) INCREASE Metoprolol Tartrate to 25mg  twice daily  If you need a refill on your cardiac medications before your next appointment, please call your pharmacy.   Lab work: None If you have labs (blood work) drawn today and your tests are completely normal, you will receive your results only by: Marland Kitchen MyChart Message (if you have MyChart) OR . A paper copy in the mail If you have any lab test that is abnormal or we need to change your treatment, we will call you to review the results.  Testing/Procedures: None  Follow-Up: At [facility], you and your health needs are our priority.  As part of our continuing mission to provide you with exceptional heart care, we have created designated Provider Care Teams.  These Care Teams include your primary Cardiologist (physician) and Advanced Practice Providers (APPs -  Physician Assistants and Nurse Practitioners) who all work together to provide you with the care you need, when you need it. You will need a follow up appointment in 6 months.  Please call our office 2 months in advance to schedule this appointment.  You may see Sinclair Grooms, MD or one of the following Advanced Practice Providers on your designated Care Team:   Truitt Merle, NP Cecilie Kicks, NP . Kathyrn Drown, NP  Any Other Special Instructions Will Be Listed Below (If Applicable).

## 2019-07-31 NOTE — Progress Notes (Signed)
Cardiology Office Note:    Date:  07/31/2019   ID:  [name], DOB Feb 17, 1966, MRN [id number]  PCP:  Sueanne Margarita, DO  Cardiologist:  Sinclair Grooms, MD   Referring MD: [name], MD   Chief Complaint  Patient presents with  . Coronary Artery Disease    History of Present Illness:    [name] is a 53 y.o. female with a hx of CAD with anterior STEMI in March that was treated with DES to the LAD, HTN. Subsequent progressive exertional chest pain and dyspnea led to re-cath and severe ostial LAD progression  --> 2 vessel CABG with LIMA to LAD and SVG to DX 04/08/2019.  This bypass pericarditis cleared with colchicine (GI upset) and ibuprofen.  [name] has been experiencing some mild chest tightness after 10 minutes of vigorous physical activity.  It is mild, and occurs both with walking and with stationary bicycling at home.  It is only occurred twice.  Cardiac rehab advised her not to come back to rehab until she has seen me.  Her EKG is normal in appearance.  Past Medical History:  Diagnosis Date  . Acute non-ST elevation myocardial infarction (NSTEMI) ([place]) 12/16/2018  . CAD S/P DES PCI -pLAD 12/16/2018   Cath 12/15/2017: Successful proximal LAD synergy 3.0 x 15 DES postdilated to 3.25 mm reducing the stenosis to 0% ; ? 60-70% ostial LAD noted on some images.  . Hyperlipidemia   . PONV (postoperative nausea and vomiting)     Past Surgical History:  Procedure Laterality Date  . CARDIAC CATHETERIZATION    . CESAREAN SECTION    . CORONARY ARTERY BYPASS GRAFT N/A 04/08/2019   Procedure: CORONARY ARTERY BYPASS GRAFTING (CABG) x two , using left internal mammary artery and right leg greater saphenous vein harvested endoscopically;  Surgeon: Ivin Poot, MD;  Location: [place];  Service: Open Heart Surgery;  Laterality: N/A;  . CORONARY/GRAFT ACUTE MI REVASCULARIZATION N/A 12/16/2018   Procedure: Coronary/Graft Acute MI Revascularization;  Surgeon: Belva Crome, MD;   Location: [place] [facility];  Service: Cardiovascular;  Laterality: N/A;  . IUD REMOVAL N/A 05/16/2013   Procedure: INTRAUTERINE DEVICE (IUD) REMOVAL;  Surgeon: Darlyn Chamber, MD;  Location: [facility];  Service: Gynecology;  Laterality: N/A;  . LAPAROSCOPIC TUBAL LIGATION Bilateral 05/16/2013   Procedure: LAPAROSCOPIC TUBAL LIGATION;  Surgeon: Darlyn Chamber, MD;  Location: [place] [facility];  Service: Gynecology;  Laterality: Bilateral;  . LEFT HEART CATH AND CORONARY ANGIOGRAPHY N/A 12/16/2018   Procedure: LEFT HEART CATH AND CORONARY ANGIOGRAPHY;  Surgeon: Belva Crome, MD;  Location: [place] [facility];  Service: Cardiovascular;  Laterality: N/A;  . LEFT HEART CATH AND CORONARY ANGIOGRAPHY N/A 03/26/2019   Procedure: LEFT HEART CATH AND CORONARY ANGIOGRAPHY;  Surgeon: Belva Crome, MD;  Location: [place] [facility];  Service: Cardiovascular;  Laterality: N/A;  . TEE WITHOUT CARDIOVERSION N/A 04/08/2019   Procedure: TRANSESOPHAGEAL ECHOCARDIOGRAM (TEE);  Surgeon: Prescott Gum, Collier Salina, MD;  Location: [place];  Service: Open Heart Surgery;  Laterality: N/A;    Current Medications: Current Meds  Medication Sig  . aspirin 81 MG chewable tablet Chew 1 tablet (81 mg total) by mouth daily.  Marland Kitchen atorvastatin (LIPITOR) 80 MG tablet Take 1 tablet (80 mg total) by mouth daily at 6 PM.  . loratadine (CLARITIN) 10 MG tablet Take 10 mg by mouth daily as needed for allergies.  . metoprolol tartrate (LOPRESSOR) 25 MG tablet Take 1 tablet (  25 mg total) by mouth 2 (two) times daily.  . nitroGLYCERIN (NITROSTAT) 0.4 MG SL tablet PLACE 1 TABLET UNDER THE TONGUE EVERY FIVE MINUTES AS NEEDED FOR CHEST PAIN.  . [DISCONTINUED] metoprolol tartrate (LOPRESSOR) 25 MG tablet Take 0.5 tablets (12.5 mg total) by mouth 2 (two) times daily.     Allergies:   Tramadol and Tetracycline   Social History   Socioeconomic History  . Marital status: Married    Spouse name: Not on file  . Number of children: Not on file  . Years of education: Not  on file  . Highest education level: Bachelor's degree (e.g., BA, AB, BS)  Occupational History  . Not on file  Social Needs  . Financial resource strain: Not hard at all  . Food insecurity    Worry: Never true    Inability: Never true  . Transportation needs    Medical: No    Non-medical: No  Tobacco Use  . Smoking status: Never Smoker  . Smokeless tobacco: Never Used  Substance and Sexual Activity  . Alcohol use: Yes    Comment:  once a month  . Drug use: No  . Sexual activity: Yes  Lifestyle  . Physical activity    Days per week: 4 days    Minutes per session: 30 min  . Stress: To some extent  Relationships  . Social Herbalist on phone: Not on file    Gets together: Not on file    Attends religious service: Not on file    Active member of club or organization: Not on file    Attends meetings of clubs or organizations: Not on file    Relationship status: Not on file  Other Topics Concern  . Not on file  Social History Narrative  . Not on file     Family History: The patient's family history includes CAD in her mother; Cancer in her paternal grandfather; Colon cancer in her father and another family member; Heart attack in her maternal grandfather, mother, and another family member; Heart disease in an other family member; Heart failure in her maternal grandmother; High blood pressure in her mother; Hyperlipidemia in her mother and sister.  ROS:   Please see the history of present illness.     All other systems reviewed and are negative.  EKGs/Labs/Other Studies Reviewed:    The following studies were reviewed today: No new data  EKG:  EKG normal sinus rhythm with normal appearance.  Recent Labs: 12/16/2018: B Natriuretic Peptide 23.5; TSH 4.499 04/04/2019: ALT 17 04/09/2019: Magnesium 3.0 04/21/2019: BUN 11; Creatinine, Ser 0.78; Potassium 3.8; Sodium 138 04/29/2019: Hemoglobin 12.6; Platelets 566  Recent Lipid Panel    Component Value Date/Time    CHOL 80 (L) 02/06/2019 0955   TRIG 75 02/06/2019 0955   HDL 33 (L) 02/06/2019 0955   CHOLHDL 2.4 02/06/2019 0955   CHOLHDL 4.4 12/16/2018 0629   VLDL 15 12/16/2018 0629   LDLCALC 32 02/06/2019 0955    Physical Exam:    VS:  BP 124/84   Pulse 68   Ht 4\' 11"  (1.499 m)   Wt 180 lb (81.6 kg)   SpO2 98%   BMI 36.36 kg/m     Wt Readings from Last 3 Encounters:  07/31/19 180 lb (81.6 kg)  06/04/19 181 lb 10.5 oz (82.4 kg)  05/13/19 179 lb (81.2 kg)     GEN: Moderate to severe obesity. No acute distress HEENT: Normal NECK: No JVD. LYMPHATICS:  No lymphadenopathy CARDIAC:  RRR without murmur, gallop, or edema. VASCULAR:  Normal Pulses. No bruits. RESPIRATORY:  Clear to auscultation without rales, wheezing or rhonchi  ABDOMEN: Soft, non-tender, non-distended, No pulsatile mass, MUSCULOSKELETAL: No deformity  SKIN: Warm and dry NEUROLOGIC:  Alert and oriented x 3 PSYCHIATRIC:  Normal affect   ASSESSMENT:    1. S/P CABG x 2   2. Pericarditis, unspecified chronicity, unspecified type   3. Hyperlipidemia with target LDL less than 70   4. Essential hypertension   5. Morbid obesity ([place])   6. Educated about COVID-19 virus infection    PLAN:    In order of problems listed above:  1. Exertional discomfort of uncertain etiology.  It is described as tightness after 10 minutes or more of moderate to heavy physical activity.  This is new but also the significant increase and intensity of exercise is new.  We will increase metoprolol tartrate to 25 mg twice daily.  She should continue to push exercise but if she develops tightness in the chest with exertion she should slow below the threshold of tightness but keep exercising.  I believe she can build endurance as time goes along. 2. Symptoms are not compatible with pericarditis. 3. LDL target is less than 70. 4. Blood pressure target 130/80 mmHg or less. 5. Decrease calories and increase physical activity 6. The 3W's are discussed and  endorsed by the patient is lifestyle changes.   Medication Adjustments/Labs and Tests Ordered: Current medicines are reviewed at length with the patient today.  Concerns regarding medicines are outlined above.  Orders Placed This Encounter  Procedures  . EKG 12-Lead   Meds ordered this encounter  Medications  . metoprolol tartrate (LOPRESSOR) 25 MG tablet    Sig: Take 1 tablet (25 mg total) by mouth 2 (two) times daily.    Dispense:  180 tablet    Refill:  3    Dose change    There are no Patient Instructions on file for this visit.   Signed, Sinclair Grooms, MD  07/31/2019 8:22 AM    [facility]

## 2019-08-01 NOTE — Progress Notes (Signed)
Cardiac Individual Treatment Plan  Patient Details  Name: [name] MRN: [id number] Date of Birth: Jun 28, 1966 Referring Provider:     [facility] from 06/04/2019 in [place]  Referring Provider  Dr. 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  aspirin 81 MG chewable tablet, Chew 1 tablet (81 mg total) by mouth daily., Disp: 30 tablet, Rfl: 0 .  atorvastatin (LIPITOR) 80 MG tablet, Take 1 tablet (80 mg total) by mouth daily at 6 PM., Disp: 90 tablet, Rfl: 3 .  loratadine (CLARITIN) 10 MG tablet, Take 10 mg by mouth daily as needed for allergies., Disp: , Rfl:  .  metoprolol tartrate (LOPRESSOR) 25 MG tablet, Take 1 tablet (25 mg total) by mouth 2 (two) times daily., Disp: 180 tablet, Rfl: 3 .  nitroGLYCERIN (NITROSTAT) 0.4 MG SL tablet, PLACE 1 TABLET UNDER THE TONGUE EVERY FIVE MINUTES AS NEEDED FOR CHEST PAIN., Disp: 25 tablet, Rfl: 5  Past Medical History: Past Medical History:  Diagnosis Date  . Acute non-ST elevation myocardial infarction (NSTEMI) ([place]) 12/16/2018  . CAD S/P DES PCI -pLAD 12/16/2018   Cath 12/15/2017: Successful proximal LAD synergy 3.0 x 15 DES postdilated to 3.25 mm reducing the stenosis to 0% ; ? 60-70% ostial LAD noted on some images.  . Hyperlipidemia   . PONV (postoperative nausea and vomiting)     Tobacco Use: Social History   Tobacco Use  Smoking Status Never Smoker  Smokeless Tobacco Never Used    Labs: Recent Review Flowsheet Data    Labs for ITP Cardiac and Pulmonary Rehab Latest Ref Rng & Units  04/08/2019 04/08/2019 04/08/2019 04/08/2019 04/08/2019   Cholestrol 100 - 199 mg/dL - - - - -   LDLCALC 0 - 99 mg/dL - - - - -   HDL >39 mg/dL - - - - -   Trlycerides 0 - 149 mg/dL - - - - -   Hemoglobin A1c 4.8 - 5.6 % - - - - -   PHART 7.350 - 7.450 7.475(H) 7.377 7.446 7.344(L) 7.335(L)   PCO2ART 32.0 - 48.0 mmHg 31.4(L) 38.4 28.6(L) 36.6 40.2   HCO3 20.0 - 28.0 mmol/L 23.1 22.6 20.1 19.9(L) 21.4   TCO2 22 - 32 mmol/L 24 24 21(L) 21(L) 23   ACIDBASEDEF 0.0 - 2.0 mmol/L - 2.0 4.0(H) 5.0(H) 4.0(H)   O2SAT % 100.0 99.0 99.0 98.0 100.0      Capillary Blood Glucose: Lab Results  Component Value Date   GLUCAP 120 (H) 04/21/2019   GLUCAP 86 04/11/2019   GLUCAP 74 04/11/2019   GLUCAP 96 04/11/2019   GLUCAP 105 (H) 04/11/2019     Exercise Target Goals: Exercise Program Goal: Individual exercise prescription set using results from initial 6 min walk test and THRR while considering  patient's activity barriers and safety.   Exercise Prescription Goal: Starting with aerobic activity 30 plus minutes a day, 3 days per week for initial exercise prescription. Provide home exercise prescription and guidelines that participant acknowledges understanding prior to discharge.  Activity Barriers & Risk Stratification: Activity Barriers &  Cardiac Risk Stratification - 06/04/19 1534      Activity Barriers & Cardiac Risk Stratification   Activity Barriers  Deconditioning;Shortness of Breath;Incisional Pain    Cardiac Risk Stratification  High       6 Minute Walk: 6 Minute Walk    Row Name 06/04/19 1533 07/26/19 1503       6 Minute Walk   Phase  Initial  Discharge    Distance  1283 feet  1600 feet    Distance % Change  -  24.71 %    Walk Time  6 minutes  6 minutes    # of Rest Breaks  0  0    MPH  2.4  3.03    METS  3.3  4.39    RPE  13  12    Perceived Dyspnea   1  0    VO2 Peak  11.89  15.38    Symptoms  Yes (comment)  Yes (comment)    Comments  Miold SOB  Patient c/o chest/  squeezing, which she rated a "2.5/5" on the angina scale. Symptoms resolved with rest.    Resting HR  85 bpm  80 bpm    Resting BP  104/62  110/70    Resting Oxygen Saturation   100 %  -    Exercise Oxygen Saturation  during 6 min walk  98 %  -    Max Ex. HR  109 bpm  138 bpm    Max Ex. BP  122/68  140/80    2 Minute Post BP  102/62  112/80       Oxygen Initial Assessment:   Oxygen Re-Evaluation:   Oxygen Discharge (Final Oxygen Re-Evaluation):   Initial Exercise Prescription: Initial Exercise Prescription - 06/04/19 1500      Date of Initial Exercise RX and Referring Provider   Date  06/04/19    Referring Provider  Dr. Tamala Julian    Expected Discharge Date  07/19/19      Treadmill   MPH  2.2    Grade  0    Minutes  15      NuStep   Level  3    SPM  85    Minutes  15    METs  2.9      Prescription Details   Frequency (times per week)  3    Duration  Progress to 30 minutes of continuous aerobic without signs/symptoms of physical distress      Intensity   THRR 40-80% of Max Heartrate  68-136    Ratings of Perceived Exertion  11-13    Perceived Dyspnea  0-4      Progression   Progression  Continue to progress workloads to maintain intensity without signs/symptoms of physical distress.      Resistance Training   Training Prescription  Yes    Weight  3 lbs.     Reps  10-15       Perform Capillary Blood Glucose checks as needed.  Exercise Prescription Changes: Exercise Prescription Changes    Row Name 06/10/19 1101 07/01/19 1101 07/03/19 1100 07/15/19 1517 07/26/19 1502     Response to Exercise   Blood Pressure (Admit)  104/72  118/78  118/64  116/68  110/70   Blood Pressure (Exercise)  118/78  128/66  118/64  112/66  146/80   Blood Pressure (Exit)  100/80  110/76  108/66  100/70  112/80   Heart Rate (Admit)  96 bpm  84  bpm  97 bpm  87 bpm  80 bpm   Heart Rate (Exercise)  112 bpm  115 bpm  121 bpm  128 bpm  138 bpm   Heart Rate (Exit)  94 bpm  81 bpm  86 bpm   103 bpm  89 bpm   Rating of Perceived Exertion (Exercise)  '13  12  12  12  12   ' Symptoms  none  none  none  none  Chest pain during walk test.   Comments  Pt tolerated 1st exercise well.  -  -  -  -   Duration  Continue with 30 min of aerobic exercise without signs/symptoms of physical distress.  Continue with 30 min of aerobic exercise without signs/symptoms of physical distress.  Continue with 30 min of aerobic exercise without signs/symptoms of physical distress.  Continue with 30 min of aerobic exercise without signs/symptoms of physical distress.  Continue with 30 min of aerobic exercise without signs/symptoms of physical distress.   Intensity  THRR unchanged  THRR unchanged  THRR unchanged  THRR unchanged  THRR unchanged     Progression   Progression  Continue to progress workloads to maintain intensity without signs/symptoms of physical distress.  Continue to progress workloads to maintain intensity without signs/symptoms of physical distress.  Continue to progress workloads to maintain intensity without signs/symptoms of physical distress.  Continue to progress workloads to maintain intensity without signs/symptoms of physical distress.  Continue to progress workloads to maintain intensity without signs/symptoms of physical distress.   Average METs  2.3  -  2.7  3.1  3.3     Resistance Training   Training Prescription  Yes  Yes  No Relaxation day, no weights.  Yes  Yes   Weight  3 lbs.   4lbs  -  4lbs  4lbs   Reps  10-15  10-15  -  10-15  10-15   Time  10 Minutes  10 Minutes  -  10 Minutes  10 Minutes     Interval Training   Interval Training  No  No  No  No  No     Treadmill   MPH  2.2  2.3  2.3  2.3  2.4   Grade  0  '1  1  3  3   ' Minutes  '15  15  15  15  15   ' METs  2.69  3.08  3.08  3.71  3.83     NuStep   Level  '3  4  4  4  5   ' SPM  85  85  85  85  85   Minutes  '15  15  15  15  15   ' METs  2  - Didn't report MET avg  2.4  2.4  2.8     Track   Laps  -  -  -  -  8 1600 ft    Minutes  -  -  -  -  6 Walk test   METs  -  -  -  -  3.32     Home Exercise Plan   Plans to continue exercise at  -  Home (comment) Walking  Home (comment) Walking  Home (comment) Walking  Home (comment) Walking   Frequency  -  Add 4 additional days to program exercise sessions.  Add 4 additional days to program exercise sessions.  Add 4 additional days to program exercise sessions.  Add 4 additional days  to program exercise sessions.   Initial Home Exercises Provided  -  06/17/19  06/17/19  06/17/19  06/17/19      Exercise Comments: Exercise Comments    Row Name 06/10/19 1156 06/17/19 1126 07/01/19 1120 07/17/19 1540 07/29/19 1500   Exercise Comments  Patient tolerated 1st session of exercise well without symptoms.  Reviewed home exercise guidelines and goals with patient.  Reviewed METs and goals with patient.  Reviewed METs and goals with patient.  No exercise today. Patient reported having chest pain while walking on treadmill at home. Will await clearance from cardiologist to resume exercise.      Exercise Goals and Review: Exercise Goals    Row Name 06/04/19 1536             Exercise Goals   Increase Physical Activity  Yes       Intervention  Provide advice, education, support and counseling about physical activity/exercise needs.;Develop an individualized exercise prescription for aerobic and resistive training based on initial evaluation findings, risk stratification, comorbidities and participant's personal goals.       Expected Outcomes  Short Term: Attend rehab on a regular basis to increase amount of physical activity.;Long Term: Add in home exercise to make exercise part of routine and to increase amount of physical activity.;Long Term: Exercising regularly at least 3-5 days a week.       Increase Strength and Stamina  Yes       Intervention  Provide advice, education, support and counseling about physical activity/exercise needs.;Develop an individualized exercise  prescription for aerobic and resistive training based on initial evaluation findings, risk stratification, comorbidities and participant's personal goals.       Expected Outcomes  Short Term: Increase workloads from initial exercise prescription for resistance, speed, and METs.;Short Term: Perform resistance training exercises routinely during rehab and add in resistance training at home;Long Term: Improve cardiorespiratory fitness, muscular endurance and strength as measured by increased METs and functional capacity (6MWT)       Able to understand and use rate of perceived exertion (RPE) scale  Yes       Intervention  Provide education and explanation on how to use RPE scale       Expected Outcomes  Short Term: Able to use RPE daily in rehab to express subjective intensity level;Long Term:  Able to use RPE to guide intensity level when exercising independently       Knowledge and understanding of Target Heart Rate Range (THRR)  Yes       Intervention  Provide education and explanation of THRR including how the numbers were predicted and where they are located for reference       Expected Outcomes  Short Term: Able to state/look up THRR;Long Term: Able to use THRR to govern intensity when exercising independently;Short Term: Able to use daily as guideline for intensity in rehab       Able to check pulse independently  Yes       Intervention  Provide education and demonstration on how to check pulse in carotid and radial arteries.;Review the importance of being able to check your own pulse for safety during independent exercise       Expected Outcomes  Short Term: Able to explain why pulse checking is important during independent exercise;Long Term: Able to check pulse independently and accurately       Understanding of Exercise Prescription  Yes       Intervention  Provide education, explanation, and written materials on  patient's individual exercise prescription       Expected Outcomes  Short Term:  Able to explain program exercise prescription;Long Term: Able to explain home exercise prescription to exercise independently          Exercise Goals Re-Evaluation : Exercise Goals Re-Evaluation    Row Name 06/10/19 1156 06/17/19 1126 07/01/19 1120 07/17/19 1540 07/26/19 1602     Exercise Goal Re-Evaluation   Exercise Goals Review  Increase Physical Activity;Able to understand and use rate of perceived exertion (RPE) scale;Increase Strength and Stamina  Increase Physical Activity;Able to understand and use rate of perceived exertion (RPE) scale;Increase Strength and Stamina;Understanding of Exercise Prescription;Knowledge and understanding of Target Heart Rate Range (THRR);Able to check pulse independently  Increase Physical Activity;Able to understand and use rate of perceived exertion (RPE) scale;Increase Strength and Stamina;Understanding of Exercise Prescription;Knowledge and understanding of Target Heart Rate Range (THRR);Able to check pulse independently  Increase Physical Activity;Able to understand and use rate of perceived exertion (RPE) scale;Increase Strength and Stamina;Understanding of Exercise Prescription;Knowledge and understanding of Target Heart Rate Range (THRR);Able to check pulse independently  Increase Physical Activity;Able to understand and use rate of perceived exertion (RPE) scale;Increase Strength and Stamina;Understanding of Exercise Prescription;Knowledge and understanding of Target Heart Rate Range (THRR);Able to check pulse independently   Comments  Patient tolerated 1st session of exercise well. Patient able to understand and use the RPE scale appropriately.  Reviewed home exercise guidelines with patient including THRR, RPE scale, and endpoints for exercise. Pt knows how to check her pulse independently. Pt is stretching and walking daily at least 60 minutes with rest breaks taken as needed. Pt also has a treadmill at home that she uses when the weather doesn't permit  exercise outdoors.  Patient is doing well with walking at home, but still a little "worn out" going up hills. Pt feels she's "halfway to her goal" which is to have no SOB, no CP, and to be able to walk faster.  Patient still having some SOB walking up hills. Pt was concerned about her resting heart rate being high and discussed with her physician. Her physician advised her that she maybe overdoing things since she walks 60 minutes daily, including on the days she attends cardaic rehab. We discussed patient not walking on the days she attends CR and having at least one rest day from exercise, and patient is amenable to this.  Patient's functional capacity increased 25% as measured by 6MWT and strength increased 12% as measured by grip strength test.   Expected Outcomes  Progress workloads as tolerated to help increase strength and stamina.  Progress workloads as tolerated to help improve strength and stamina.  Increase speed and incline on treadmill to help build stamina for walking.  Patient will exercise 30-60 minutes, 5-6 days/week to help build stamina with at least one day of rest to recover.  Patient will exercise 30-60 minutes, 5-6 days/week to help build stamina with at least one day of rest to recover.       Discharge Exercise Prescription (Final Exercise Prescription Changes): Exercise Prescription Changes - 07/26/19 1502      Response to Exercise   Blood Pressure (Admit)  110/70    Blood Pressure (Exercise)  146/80    Blood Pressure (Exit)  112/80    Heart Rate (Admit)  80 bpm    Heart Rate (Exercise)  138 bpm    Heart Rate (Exit)  89 bpm    Rating of Perceived Exertion (Exercise)  12  Symptoms  Chest pain during walk test.    Duration  Continue with 30 min of aerobic exercise without signs/symptoms of physical distress.    Intensity  THRR unchanged      Progression   Progression  Continue to progress workloads to maintain intensity without signs/symptoms of physical distress.     Average METs  3.3      Resistance Training   Training Prescription  Yes    Weight  4lbs    Reps  10-15    Time  10 Minutes      Interval Training   Interval Training  No      Treadmill   MPH  2.4    Grade  3    Minutes  15    METs  3.83      NuStep   Level  5    SPM  85    Minutes  15    METs  2.8      Track   Laps  8   1600 ft   Minutes  6   Walk test   METs  3.32      Home Exercise Plan   Plans to continue exercise at  Home (comment)   Walking   Frequency  Add 4 additional days to program exercise sessions.    Initial Home Exercises Provided  06/17/19       Nutrition:  Target Goals: Understanding of nutrition guidelines, daily intake of sodium <156m, cholesterol <2035m calories 30% from fat and 7% or less from saturated fats, daily to have 5 or more servings of fruits and vegetables.  Biometrics: Pre Biometrics - 06/04/19 1537      Pre Biometrics   Height  '4\' 11"'  (1.499 m)    Weight  82.4 kg    Waist Circumference  40 inches    Hip Circumference  45 inches    Waist to Hip Ratio  0.89 %    BMI (Calculated)  36.67    Triceps Skinfold  42 mm    % Body Fat  47.6 %    Grip Strength  29 kg    Flexibility  15 in    Single Leg Stand  12.81 seconds      Post Biometrics - 07/26/19 1515       Post  Biometrics   Waist Circumference  38 inches    Hip Circumference  45.25 inches    Waist to Hip Ratio  0.84 %    Triceps Skinfold  41 mm    Grip Strength  32.5 kg    Flexibility  15 in    Single Leg Stand  30 seconds       Nutrition Therapy Plan and Nutrition Goals:   Nutrition Assessments:   Nutrition Goals Re-Evaluation:   Nutrition Goals Discharge (Final Nutrition Goals Re-Evaluation):   Psychosocial: Target Goals: Acknowledge presence or absence of significant depression and/or stress, maximize coping skills, provide positive support system. Participant is able to verbalize types and ability to use techniques and skills needed for reducing  stress and depression.  Initial Review & Psychosocial Screening: Initial Psych Review & Screening - 06/04/19 1505      Initial Review   Current issues with  Current Stress Concerns    Source of Stress Concerns  Unable to participate in former interests or hobbies    Comments  AnKenlyas not been able to do the things she normally participated in before her cardiac event  Family Dynamics   Good Support System?  Yes   [name] has her husband for support     Barriers   Psychosocial barriers to participate in program  The patient should benefit from training in stress management and relaxation.      Screening Interventions   Interventions  Encouraged to exercise;To provide support and resources with identified psychosocial needs       Quality of Life Scores: Quality of Life - 07/29/19 1414      Quality of Life   Select  Quality of Life      Quality of Life Scores   Health/Function Pre  15.03 %    Health/Function Post  27.6 %    Health/Function % Change  83.63 %    Socioeconomic Pre  25.64 %    Socioeconomic Post  29.14 %    Socioeconomic % Change   13.65 %    Psych/Spiritual Pre  26.14 %    Psych/Spiritual Post  30 %    Psych/Spiritual % Change  14.77 %    Family Pre  24 %    Family Post  30 %    Family % Change  25 %    GLOBAL Pre  20.82 %    GLOBAL Post  28.76 %    GLOBAL % Change  38.14 %      Scores of 19 and below usually indicate a poorer quality of life in these areas.  A difference of  2-3 points is a clinically meaningful difference.  A difference of 2-3 points in the total score of the Quality of Life Index has been associated with significant improvement in overall quality of life, self-image, physical symptoms, and general health in studies assessing change in quality of life.  PHQ-9: Recent Review Flowsheet Data    Depression screen [facility] 2/9 06/04/2019   Decreased Interest 0   Down, Depressed, Hopeless 0   PHQ - 2 Score 0     Interpretation of Total  Score  Total Score Depression Severity:  1-4 = Minimal depression, 5-9 = Mild depression, 10-14 = Moderate depression, 15-19 = Moderately severe depression, 20-27 = Severe depression   Psychosocial Evaluation and Intervention: Psychosocial Evaluation - 06/10/19 1354      Psychosocial Evaluation & Interventions   Interventions  Stress management education;Encouraged to exercise with the program and follow exercise prescription;Relaxation education    Comments  [name] has been unable to participate in former activities.  Encourgaed pt to continue exercising at [facility].  Earlyne enjoys participating in [employer].    Expected Outcomes  Shamonique will report decreased stress and maintain a positive outlook.    Continue Psychosocial Services   Follow up required by staff       Psychosocial Re-Evaluation: Psychosocial Re-Evaluation    [place] Name 07/03/19 4098 08/01/19 0925           Psychosocial Re-Evaluation   Current issues with  Current Stress Concerns  Current Stress Concerns      Comments  [name] and this RN had a long discussion about her recovery and comparison to friends who do not have any CV issues.  I encouraged pt to give herself some grace and decrease the unrealistic expectations she has established for her recovery.  [name] continues to exhibit s/s of stress and anxiety including elevated HR at rest, intermittant tearfulness when discussing her heart conditions, and verbalization of work stress. She acknolodges a strong support system. She uses exercise as stress relief.  Expected Outcomes  [name] will establish realistic expectations for her recovery and maintain a positive outlook.  [name] will establish realistic expectations for her recovery and maintain a positive outlook. She will maintain a positive outlook. She will self assess for worsening symptoms of stress and discuss with her medical providers as needed.      Interventions  Stress management education;Relaxation  education;Encouraged to attend Cardiac Rehabilitation for the exercise  Stress management education;Relaxation education;Encouraged to attend Cardiac Rehabilitation for the exercise      Continue Psychosocial Services   Follow up required by staff  Follow up required by staff         Psychosocial Discharge (Final Psychosocial Re-Evaluation): Psychosocial Re-Evaluation - 08/01/19 0925      Psychosocial Re-Evaluation   Current issues with  Current Stress Concerns    Comments  [name] continues to exhibit s/s of stress and anxiety including elevated HR at rest, intermittant tearfulness when discussing her heart conditions, and verbalization of work stress. She acknolodges a strong support system. She uses exercise as stress relief.    Expected Outcomes  [name] will establish realistic expectations for her recovery and maintain a positive outlook. She will maintain a positive outlook. She will self assess for worsening symptoms of stress and discuss with her medical providers as needed.    Interventions  Stress management education;Relaxation education;Encouraged to attend Cardiac Rehabilitation for the exercise    Continue Psychosocial Services   Follow up required by staff       Vocational Rehabilitation: Provide vocational rehab assistance to qualifying candidates.   Vocational Rehab Evaluation & Intervention: Vocational Rehab - 06/04/19 1507      Initial Vocational Rehab Evaluation & Intervention   Assessment shows need for Vocational Rehabilitation  No      Vocational Rehab Re-Evaulation   Comments  [name] is currently employed and does not need vocational rehab at this time.       Education: Education Goals: Education classes will be provided on a weekly basis, covering required topics. Participant will state understanding/return demonstration of topics presented.  Learning Barriers/Preferences: Learning Barriers/Preferences - 06/04/19 1542      Learning Barriers/Preferences    Learning Barriers  None    Learning Preferences  Written Material;Skilled Demonstration;Pictoral;Video       Education Topics: Hypertension, Hypertension Reduction -Define heart disease and high blood pressure. Discus how high blood pressure affects the body and ways to reduce high blood pressure.   Exercise and Your Heart -Discuss why it is important to exercise, the FITT principles of exercise, normal and abnormal responses to exercise, and how to exercise safely.   Angina -Discuss definition of angina, causes of angina, treatment of angina, and how to decrease risk of having angina.   Cardiac Medications -Review what the following cardiac medications are used for, how they affect the body, and side effects that may occur when taking the medications.  Medications include Aspirin, Beta blockers, calcium channel blockers, ACE Inhibitors, angiotensin receptor blockers, diuretics, digoxin, and antihyperlipidemics.   Congestive Heart Failure -Discuss the definition of CHF, how to live with CHF, the signs and symptoms of CHF, and how keep track of weight and sodium intake.   Heart Disease and Intimacy -Discus the effect sexual activity has on the heart, how changes occur during intimacy as we age, and safety during sexual activity.   Smoking Cessation / COPD -Discuss different methods to quit smoking, the health benefits of quitting smoking, and the definition of COPD.   Nutrition I:  Fats -Discuss the types of cholesterol, what cholesterol does to the heart, and how cholesterol levels can be controlled.   Nutrition II: Labels -Discuss the different components of food labels and how to read food label   Heart Parts/Heart Disease and PAD -Discuss the anatomy of the heart, the pathway of blood circulation through the heart, and these are affected by heart disease.   Stress I: Signs and Symptoms -Discuss the causes of stress, how stress may lead to anxiety and depression, and  ways to limit stress.   Stress II: Relaxation -Discuss different types of relaxation techniques to limit stress.   Warning Signs of Stroke / TIA -Discuss definition of a stroke, what the signs and symptoms are of a stroke, and how to identify when someone is having stroke.   Knowledge Questionnaire Score: Knowledge Questionnaire Score - 07/29/19 1416      Knowledge Questionnaire Score   Pre Score  23/24    Post Score  24/24       Core Components/Risk Factors/Patient Goals at Admission: Personal Goals and Risk Factors at Admission - 06/04/19 1609      Core Components/Risk Factors/Patient Goals on Admission    Weight Management  Yes;Obesity;Weight Maintenance;Weight Loss    Intervention  Weight Management: Provide education and appropriate resources to help participant work on and attain dietary goals.;Weight Management/Obesity: Establish reasonable short term and long term weight goals.;Obesity: Provide education and appropriate resources to help participant work on and attain dietary goals.    Admit Weight  181 lb 10.5 oz (82.4 kg)    Lipids  Yes    Intervention  Provide education and support for participant on nutrition & aerobic/resistive exercise along with prescribed medications to achieve LDL <46m, HDL >460m    Expected Outcomes  Short Term: Participant states understanding of desired cholesterol values and is compliant with medications prescribed. Participant is following exercise prescription and nutrition guidelines.;Long Term: Cholesterol controlled with medications as prescribed, with individualized exercise RX and with personalized nutrition plan. Value goals: LDL < 7023mHDL > 40 mg.    Stress  Yes    Intervention  Offer individual and/or small group education and counseling on adjustment to heart disease, stress management and health-related lifestyle change. Teach and support self-help strategies.;Refer participants experiencing significant psychosocial distress to  appropriate mental health specialists for further evaluation and treatment. When possible, include family members and significant others in education/counseling sessions.    Expected Outcomes  Short Term: Participant demonstrates changes in health-related behavior, relaxation and other stress management skills, ability to obtain effective social support, and compliance with psychotropic medications if prescribed.;Long Term: Emotional wellbeing is indicated by absence of clinically significant psychosocial distress or social isolation.       Core Components/Risk Factors/Patient Goals Review:  Goals and Risk Factor Review    Row Name 06/10/19 1400 07/03/19 1653 08/01/19 0930         Core Components/Risk Factors/Patient Goals Review   Personal Goals Review  Weight Management/Obesity;Hypertension;Lipids;Stress  Weight Management/Obesity;Hypertension;Lipids;Stress  Weight Management/Obesity;Hypertension;Lipids;Stress     Review  Pt with multiple CAD RFs willing to participate in CR.  AngPearsonuld like to walk two miles without chest tightness.  Pt with multiple CAD RFs willing to participate in CR.  [name] continues to do well with exercise.  Pt with multiple CAD RFs willing to participate in CR.  [name] continues to do well with exercise.     Expected Outcomes  Pt will continue to participate in CR exercise, nutrition, and  lifestyle modification opportunities.  Pt will continue to participate in CR exercise, nutrition, and lifestyle modification opportunities.  Pt will continue to participate in CR exercise, nutrition, and lifestyle modification opportunities.        Core Components/Risk Factors/Patient Goals at Discharge (Final Review):  Goals and Risk Factor Review - 08/01/19 0930      Core Components/Risk Factors/Patient Goals Review   Personal Goals Review  Weight Management/Obesity;Hypertension;Lipids;Stress    Review  Pt with multiple CAD RFs willing to participate in CR.  [name] continues to  do well with exercise.    Expected Outcomes  Pt will continue to participate in CR exercise, nutrition, and lifestyle modification opportunities.       ITP Comments: ITP Comments    Row Name 06/04/19 1503 06/10/19 1353 07/03/19 1652 08/01/19 0923     ITP Comments  Dr Fransico Him Md, Medical Director  Pt started exercise and tolerated it well.  30 Day ITP Review. [name] continues to tolerate exercise well.  She feels that she is increasing her motivation level slowly.  She is planning to restart working and was able to switch class times to continue CR.  30 Day ITP Review. [name] continues to tolerate exercise well.  She feels that she is increasing her motivation level slowly. She has recently had 3 episodes of chest tightness with moderate exertion. Dr. Tamala Julian increased her metoprolol which she will start tonight. She continues to exercise at home on days she does not participate in CR. She admits to stress related to work.       Comments: See ITP comments

## 2019-08-01 NOTE — Progress Notes (Signed)
Discharge Progress Report  Patient Details  Name: [name] MRN: [id number] Date of Birth: 1966/06/26 Referring Provider:     [place] from 06/04/2019 in [facility]  Referring Provider  Dr. Tamala Julian       Number of Visits: 19  Reason for Discharge:  Patient reached a stable level of exercise. Patient independent in their exercise. Patient has met program and personal goals.  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 Tamala Julian    Expected Discharge Date  07/19/19      Treadmill   MPH  2.2    Grade  0    Minutes  15      NuStep   Level  3    SPM  85    Minutes  15    METs  2.9      Prescription Details   Frequency (times per week)  3    Duration  Progress to 30 minutes of continuous aerobic without signs/symptoms of physical distress      Intensity   THRR 40-80% of Max Heartrate  68-136    Ratings of Perceived Exertion  11-13    Perceived Dyspnea  0-4      Progression   Progression  Continue to progress workloads to maintain intensity without signs/symptoms of physical distress.      Resistance Training   Training Prescription  Yes    Weight  3 lbs.     Reps  10-15       Discharge Exercise Prescription (Final Exercise Prescription Changes): Exercise Prescription Changes - 08/02/19 1504      Response to Exercise   Blood Pressure (Admit)  106/66    Blood Pressure (Exercise)  128/78    Blood Pressure (Exit)  102/62    Heart Rate (Admit)  83 bpm     Heart Rate (Exercise)  132 bpm    Heart Rate (Exit)  88 bpm    Rating of Perceived Exertion (Exercise)  12    Symptoms  none    Comments  Pt completed the phase 2 cardiac rehab program.    Duration  Continue with 30 min of aerobic exercise without signs/symptoms of physical distress.    Intensity  THRR unchanged      Progression   Progression  Continue to progress workloads to maintain intensity without signs/symptoms of physical distress.    Average METs  3.6      Resistance Training   Training Prescription  Yes    Weight  4lbs    Reps  10-15    Time  10 Minutes      Interval Training   Interval Training  No      Treadmill   MPH  2.3    Grade  4    Minutes  15    METs  4.03      NuStep   Level  5    SPM  85    Minutes  15  METs  2.8      Track   Laps  --    Minutes  --    METs  --      Home Exercise Plan   Plans to continue exercise at  Home (comment)   Walking   Frequency  Add 4 additional days to program exercise sessions.    Initial Home Exercises Provided  06/17/19       Functional Capacity: 6 Minute Walk    Row Name 06/04/19 1533 07/26/19 1503       6 Minute Walk   Phase  Initial  Discharge    Distance  1283 feet  1600 feet    Distance % Change  -  24.71 %    Walk Time  6 minutes  6 minutes    # of Rest Breaks  0  0    MPH  2.4  3.03    METS  3.3  4.39    RPE  13  12    Perceived Dyspnea   1  0    VO2 Peak  11.89  15.38    Symptoms  Yes (comment)  Yes (comment)    Comments  Miold SOB  Patient c/o chest/ squeezing, which she rated a "2.5/5" on the angina scale. Symptoms resolved with rest.    Resting HR  85 bpm  80 bpm    Resting BP  104/62  110/70    Resting Oxygen Saturation   100 %  -    Exercise Oxygen Saturation  during 6 min walk  98 %  -    Max Ex. HR  109 bpm  138 bpm    Max Ex. BP  122/68  140/80    2 Minute Post BP  102/62  112/80       Psychological, QOL, Others - Outcomes: PHQ 2/9: Depression screen PHQ 2/9 06/04/2019   Decreased Interest 0  Down, Depressed, Hopeless 0  PHQ - 2 Score 0    Quality of Life: Quality of Life - 07/29/19 1414      Quality of Life   Select  Quality of Life      Quality of Life Scores   Health/Function Pre  15.03 %    Health/Function Post  27.6 %    Health/Function % Change  83.63 %    Socioeconomic Pre  25.64 %    Socioeconomic Post  29.14 %    Socioeconomic % Change   13.65 %    Psych/Spiritual Pre  26.14 %    Psych/Spiritual Post  30 %    Psych/Spiritual % Change  14.77 %    Family Pre  24 %    Family Post  30 %    Family % Change  25 %    GLOBAL Pre  20.82 %    GLOBAL Post  28.76 %    GLOBAL % Change  38.14 %       Personal Goals: Goals established at orientation with interventions provided to work toward goal. Personal Goals and Risk Factors at Admission - 06/04/19 1609      Core Components/Risk Factors/Patient Goals on Admission    Weight Management  Yes;Obesity;Weight Maintenance;Weight Loss    Intervention  Weight Management: Provide education and appropriate resources to help participant work on and attain dietary goals.;Weight Management/Obesity: Establish reasonable short term and long term weight goals.;Obesity: Provide education and appropriate resources to help participant work on and attain dietary goals.    Admit Weight  181 lb 10.5 oz (82.4 kg)    Lipids  Yes    Intervention  Provide education and support for participant on nutrition & aerobic/resistive exercise along with prescribed medications to achieve LDL <18m, HDL >464m    Expected Outcomes  Short Term: Participant states understanding of desired cholesterol values and is compliant with medications prescribed. Participant is following exercise prescription and nutrition guidelines.;Long Term: Cholesterol controlled with medications as prescribed, with individualized exercise RX and with personalized nutrition plan. Value goals: LDL < 7089mHDL > 40 mg.    Stress  Yes    Intervention   Offer individual and/or small group education and counseling on adjustment to heart disease, stress management and health-related lifestyle change. Teach and support self-help strategies.;Refer participants experiencing significant psychosocial distress to appropriate mental health specialists for further evaluation and treatment. When possible, include family members and significant others in education/counseling sessions.    Expected Outcomes  Short Term: Participant demonstrates changes in health-related behavior, relaxation and other stress management skills, ability to obtain effective social support, and compliance with psychotropic medications if prescribed.;Long Term: Emotional wellbeing is indicated by absence of clinically significant psychosocial distress or social isolation.        Personal Goals Discharge: Goals and Risk Factor Review    Row Name 06/10/19 1400 07/03/19 1653 08/01/19 0930         Core Components/Risk Factors/Patient Goals Review   Personal Goals Review  Weight Management/Obesity;Hypertension;Lipids;Stress  Weight Management/Obesity;Hypertension;Lipids;Stress  Weight Management/Obesity;Hypertension;Lipids;Stress     Review  Pt with multiple CAD RFs willing to participate in CR.  AngEriauld like to walk two miles without chest tightness.  Pt with multiple CAD RFs willing to participate in CR.  Angie continues to do well with exercise.  Pt with multiple CAD RFs willing to participate in CR.  Angie continues to do well with exercise.     Expected Outcomes  Pt will continue to participate in CR exercise, nutrition, and lifestyle modification opportunities.  Pt will continue to participate in CR exercise, nutrition, and lifestyle modification opportunities.  Pt will continue to participate in CR exercise, nutrition, and lifestyle modification opportunities.        Exercise Goals and Review: Exercise Goals    Row Name 06/04/19 1536             Exercise Goals    Increase Physical Activity  Yes       Intervention  Provide advice, education, support and counseling about physical activity/exercise needs.;Develop an individualized exercise prescription for aerobic and resistive training based on initial evaluation findings, risk stratification, comorbidities and participant's personal goals.       Expected Outcomes  Short Term: Attend rehab on a regular basis to increase amount of physical activity.;Long Term: Add in home exercise to make exercise part of routine and to increase amount of physical activity.;Long Term: Exercising regularly at least 3-5 days a week.       Increase Strength and Stamina  Yes       Intervention  Provide advice, education, support and counseling about physical activity/exercise needs.;Develop an individualized exercise prescription for aerobic and resistive training based on initial evaluation findings, risk stratification, comorbidities and participant's personal goals.       Expected Outcomes  Short Term: Increase workloads from initial exercise prescription for resistance, speed, and METs.;Short Term: Perform resistance training exercises routinely during rehab and add in resistance training at home;Long Term: Improve cardiorespiratory fitness, muscular endurance and strength as measured by increased METs  and functional capacity (6MWT)       Able to understand and use rate of perceived exertion (RPE) scale  Yes       Intervention  Provide education and explanation on how to use RPE scale       Expected Outcomes  Short Term: Able to use RPE daily in rehab to express subjective intensity level;Long Term:  Able to use RPE to guide intensity level when exercising independently       Knowledge and understanding of Target Heart Rate Range (THRR)  Yes       Intervention  Provide education and explanation of THRR including how the numbers were predicted and where they are located for reference       Expected Outcomes  Short Term: Able to  state/look up THRR;Long Term: Able to use THRR to govern intensity when exercising independently;Short Term: Able to use daily as guideline for intensity in rehab       Able to check pulse independently  Yes       Intervention  Provide education and demonstration on how to check pulse in carotid and radial arteries.;Review the importance of being able to check your own pulse for safety during independent exercise       Expected Outcomes  Short Term: Able to explain why pulse checking is important during independent exercise;Long Term: Able to check pulse independently and accurately       Understanding of Exercise Prescription  Yes       Intervention  Provide education, explanation, and written materials on patient's individual exercise prescription       Expected Outcomes  Short Term: Able to explain program exercise prescription;Long Term: Able to explain home exercise prescription to exercise independently          Exercise Goals Re-Evaluation: Exercise Goals Re-Evaluation    Row Name 06/10/19 1156 06/17/19 1126 07/01/19 1120 07/17/19 1540 07/26/19 1602     Exercise Goal Re-Evaluation   Exercise Goals Review  Increase Physical Activity;Able to understand and use rate of perceived exertion (RPE) scale;Increase Strength and Stamina  Increase Physical Activity;Able to understand and use rate of perceived exertion (RPE) scale;Increase Strength and Stamina;Understanding of Exercise Prescription;Knowledge and understanding of Target Heart Rate Range (THRR);Able to check pulse independently  Increase Physical Activity;Able to understand and use rate of perceived exertion (RPE) scale;Increase Strength and Stamina;Understanding of Exercise Prescription;Knowledge and understanding of Target Heart Rate Range (THRR);Able to check pulse independently  Increase Physical Activity;Able to understand and use rate of perceived exertion (RPE) scale;Increase Strength and Stamina;Understanding of Exercise  Prescription;Knowledge and understanding of Target Heart Rate Range (THRR);Able to check pulse independently  Increase Physical Activity;Able to understand and use rate of perceived exertion (RPE) scale;Increase Strength and Stamina;Understanding of Exercise Prescription;Knowledge and understanding of Target Heart Rate Range (THRR);Able to check pulse independently   Comments  Patient tolerated 1st session of exercise well. Patient able to understand and use the RPE scale appropriately.  Reviewed home exercise guidelines with patient including THRR, RPE scale, and endpoints for exercise. Pt knows how to check her pulse independently. Pt is stretching and walking daily at least 60 minutes with rest breaks taken as needed. Pt also has a treadmill at home that she uses when the weather doesn't permit exercise outdoors.  Patient is doing well with walking at home, but still a little "worn out" going up hills. Pt feels she's "halfway to her goal" which is to have no SOB, no CP, and to be able  to walk faster.  Patient still having some SOB walking up hills. Pt was concerned about her resting heart rate being high and discussed with her physician. Her physician advised her that she maybe overdoing things since she walks 60 minutes daily, including on the days she attends cardaic rehab. We discussed patient not walking on the days she attends CR and having at least one rest day from exercise, and patient is amenable to this.  Patient's functional capacity increased 25% as measured by 6MWT and strength increased 12% as measured by grip strength test.   Expected Outcomes  Progress workloads as tolerated to help increase strength and stamina.  Progress workloads as tolerated to help improve strength and stamina.  Increase speed and incline on treadmill to help build stamina for walking.  Patient will exercise 30-60 minutes, 5-6 days/week to help build stamina with at least one day of rest to recover.  Patient will exercise  30-60 minutes, 5-6 days/week to help build stamina with at least one day of rest to recover.   [place] Name 08/02/19 1550             Exercise Goal Re-Evaluation   Exercise Goals Review  Increase Physical Activity;Able to understand and use rate of perceived exertion (RPE) scale;Increase Strength and Stamina;Understanding of Exercise Prescription;Knowledge and understanding of Target Heart Rate Range (THRR);Able to check pulse independently       Comments  Patient completed the phase 2 cardiac rehab program and progressed well, achieving 3-4 METs with exercise. Pt plans to continue walking outdoors or on her treadmill at home, 30-60 minutes, 5-6 days/week.       Expected Outcomes  Patient will exercise 30-60 minutes, 5-6 days/week to maintain health and fitness gains.          Nutrition & Weight - Outcomes: Pre Biometrics - 06/04/19 1537      Pre Biometrics   Height  _0  (1.499 m)    Weight  82.4 kg    Waist Circumference  40 inches    Hip Circumference  45 inches    Waist to Hip Ratio  0.89 %    BMI (Calculated)  36.67    Triceps Skinfold  42 mm    % Body Fat  47.6 %    Grip Strength  29 kg    Flexibility  15 in    Single Leg Stand  12.81 seconds      Post Biometrics - 08/02/19 1504       Post  Biometrics   Height  _1  (1.499 m)    Weight  82 kg    Waist Circumference  38 inches    Hip Circumference  45.25 inches    Waist to Hip Ratio  0.84 %    BMI (Calculated)  36.49    Triceps Skinfold  41 mm    % Body Fat  46.7 %    Grip Strength  32.5 kg    Flexibility  15 in    Single Leg Stand  30 seconds       Nutrition:   Nutrition Discharge:   Education Questionnaire Score: Knowledge Questionnaire Score - 07/29/19 1416      Knowledge Questionnaire Score   Pre Score  23/24    Post Score  24/24       Pt graduated from cardiac rehab program today with completion of 19 exercise sessions in Phase II. Pt maintained good attendance and progressed nicely during his  participation in rehab as  evidenced by increased MET level. Medication list reconciled. Repeat  PHQ score-0.  Pt has made significant lifestyle changes and should be commended for his success. Pt feels he has achieved his goals during cardiac rehab.  Goals reviewed with patient; copy given to patient.

## 2019-08-02 ENCOUNTER — Other Ambulatory Visit: Payer: Self-pay

## 2019-08-02 ENCOUNTER — Encounter (HOSPITAL_COMMUNITY)
Admission: RE | Admit: 2019-08-02 | Discharge: 2019-08-02 | Disposition: A | Discharge status: Home / Self Care | Payer: 59 | Source: Ambulatory Visit | Attending: Interventional Cardiology | Admitting: Interventional Cardiology

## 2019-08-02 VITALS — BP 106/66 | HR 83 | Temp 97.7°F | Ht 59.0 in | Wt 180.8 lb

## 2019-08-02 DIAGNOSIS — Z955 Presence of coronary angioplasty implant and graft: Secondary | ICD-10-CM

## 2019-08-02 DIAGNOSIS — Z951 Presence of aortocoronary bypass graft: Secondary | ICD-10-CM

## 2019-08-02 DIAGNOSIS — I2102 ST elevation (STEMI) myocardial infarction involving left anterior descending coronary artery: Secondary | ICD-10-CM

## 2019-08-15 ENCOUNTER — Ambulatory Visit: Payer: 59 | Admitting: Interventional Cardiology

## 2019-08-21 MED FILL — METOPROLOL TARTRATE 25 MG T: 25 | 90 days supply | Qty: 180 | Fill #0

## 2019-10-14 DIAGNOSIS — I25728 Atherosclerosis of autologous artery coronary artery bypass graft(s) with other forms of angina pectoris: Secondary | ICD-10-CM | POA: Diagnosis not present

## 2019-10-14 DIAGNOSIS — E7849 Other hyperlipidemia: Secondary | ICD-10-CM | POA: Diagnosis not present

## 2019-10-14 DIAGNOSIS — I519 Heart disease, unspecified: Secondary | ICD-10-CM | POA: Diagnosis not present

## 2019-10-21 MED FILL — ATORVASTATIN 80 MG TABLET: 80 | 90 days supply | Qty: 90 | Fill #3

## 2019-11-06 MED FILL — PIMECROLIMUS 1 % CREA: 1 | 30 days supply | Qty: 30 | Fill #0

## 2019-11-06 MED FILL — OLOPATADINE HCL 0.2% EYE DR: 0.2 | 30 days supply | Qty: 3 | Fill #0

## 2019-11-18 MED FILL — METOPROLOL TARTRATE 25 MG T: 25 | 90 days supply | Qty: 180 | Fill #1

## 2020-01-20 ENCOUNTER — Other Ambulatory Visit: Payer: Self-pay | Admitting: Nurse Practitioner

## 2020-01-20 MED FILL — ATORVASTATIN 80 MG TABLET: 80 | 90 days supply | Qty: 90 | Fill #0

## 2020-02-10 ENCOUNTER — Other Ambulatory Visit: Payer: Self-pay

## 2020-02-11 ENCOUNTER — Other Ambulatory Visit: Payer: Self-pay

## 2020-02-11 ENCOUNTER — Ambulatory Visit: Payer: 59 | Admitting: Interventional Cardiology

## 2020-02-11 ENCOUNTER — Encounter: Payer: Self-pay | Admitting: Interventional Cardiology

## 2020-02-11 VITALS — BP 116/64 | HR 68 | Ht 59.0 in | Wt 190.8 lb

## 2020-02-11 DIAGNOSIS — Z7189 Other specified counseling: Secondary | ICD-10-CM | POA: Diagnosis not present

## 2020-02-11 DIAGNOSIS — I319 Disease of pericardium, unspecified: Secondary | ICD-10-CM | POA: Diagnosis not present

## 2020-02-11 DIAGNOSIS — I10 Essential (primary) hypertension: Secondary | ICD-10-CM

## 2020-02-11 DIAGNOSIS — E785 Hyperlipidemia, unspecified: Secondary | ICD-10-CM

## 2020-02-11 DIAGNOSIS — Z951 Presence of aortocoronary bypass graft: Secondary | ICD-10-CM | POA: Diagnosis not present

## 2020-02-11 NOTE — Progress Notes (Signed)
Cardiology Office Note:    Date:  02/11/2020   ID:  CHRSTINA [name], DOB 04-30-1966, MRN [id number]  PCP:  Sueanne Margarita, DO  Cardiologist:  Sinclair Grooms, MD   Referring MD: Sueanne Margarita, DO   Chief Complaint  Patient presents with  . Coronary Artery Disease    LIMA to LAD    History of Present Illness:    [name] is a 54 y.o. female with a hx of CAD withanteriorSTEMI in March that was treated with DES to the LAD,HTN. Subsequent progressive exertionalchest painand dyspnea led to re-cath and severe ostial LAD progression -->2 vessel CABG with LIMA to LAD and SVG to DX6/22/2020.This bypass pericarditis cleared with colchicine (GI upset) and ibuprofen.  Back to full schedule at work.  She drives 2 hours each day related to her commute from [facility] to and from work.  Many times when she gets home from work it is after 7:00 and she has no energy to exercise.  She is not having angina, denies exertional dyspnea, and is having no medication side effects.  She has been on high intensity statin therapy since her myocardial infarction in March 2020 right before the beginning of the pandemic.  Liver and lipid values have been significantly suppressed to LDLs much below 70.  Liver testing has been normal.  Past Medical History:  Diagnosis Date  . Acute non-ST elevation myocardial infarction (NSTEMI) ([place]) 12/16/2018  . CAD S/P DES PCI -pLAD 12/16/2018   Cath 12/15/2017: Successful proximal LAD synergy 3.0 x 15 DES postdilated to 3.25 mm reducing the stenosis to 0% ; ? 60-70% ostial LAD noted on some images.  . Hyperlipidemia   . PONV (postoperative nausea and vomiting)     Past Surgical History:  Procedure Laterality Date  . CARDIAC CATHETERIZATION    . CESAREAN SECTION    . CORONARY ARTERY BYPASS GRAFT N/A 04/08/2019   Procedure: CORONARY ARTERY BYPASS GRAFTING (CABG) x two , using left internal mammary artery and right leg greater saphenous vein harvested  endoscopically;  Surgeon: Ivin Poot, MD;  Location: [facility];  Service: Open Heart Surgery;  Laterality: N/A;  . CORONARY/GRAFT ACUTE MI REVASCULARIZATION N/A 12/16/2018   Procedure: Coronary/Graft Acute MI Revascularization;  Surgeon: Belva Crome, MD;  Location: [facility];  Service: Cardiovascular;  Laterality: N/A;  . IUD REMOVAL N/A 05/16/2013   Procedure: INTRAUTERINE DEVICE (IUD) REMOVAL;  Surgeon: Darlyn Chamber, MD;  Location: [facility];  Service: Gynecology;  Laterality: N/A;  . LAPAROSCOPIC TUBAL LIGATION Bilateral 05/16/2013   Procedure: LAPAROSCOPIC TUBAL LIGATION;  Surgeon: Darlyn Chamber, MD;  Location: [facility];  Service: Gynecology;  Laterality: Bilateral;  . LEFT HEART CATH AND CORONARY ANGIOGRAPHY N/A 12/16/2018   Procedure: LEFT HEART CATH AND CORONARY ANGIOGRAPHY;  Surgeon: Belva Crome, MD;  Location: [facility];  Service: Cardiovascular;  Laterality: N/A;  . LEFT HEART CATH AND CORONARY ANGIOGRAPHY N/A 03/26/2019   Procedure: LEFT HEART CATH AND CORONARY ANGIOGRAPHY;  Surgeon: Belva Crome, MD;  Location: [facility];  Service: Cardiovascular;  Laterality: N/A;  . TEE WITHOUT CARDIOVERSION N/A 04/08/2019   Procedure: TRANSESOPHAGEAL ECHOCARDIOGRAM (TEE);  Surgeon: Prescott Gum, Collier Salina, MD;  Location: [facility];  Service: Open Heart Surgery;  Laterality: N/A;    Current Medications: Current Meds  Medication Sig  . aspirin 81 MG chewable tablet Chew 1 tablet (81 mg total) by mouth daily.  Marland Kitchen atorvastatin (LIPITOR) 80 MG tablet  TAKE 1 TABLET (80 MG TOTAL) BY MOUTH DAILY AT 6 PM.  . loratadine (CLARITIN) 10 MG tablet Take 10 mg by mouth daily as needed for allergies.  . metoprolol tartrate (LOPRESSOR) 25 MG tablet Take 1 tablet (25 mg total) by mouth 2 (two) times daily.  . nitroGLYCERIN (NITROSTAT) 0.4 MG SL tablet PLACE 1 TABLET UNDER THE TONGUE EVERY FIVE MINUTES AS NEEDED FOR CHEST PAIN.     Allergies:   Tramadol and Tetracycline   Social History    Socioeconomic History  . Marital status: Married    Spouse name: Not on file  . Number of children: Not on file  . Years of education: Not on file  . Highest education level: Bachelor's degree (e.g., BA, AB, BS)  Occupational History  . Not on file  Tobacco Use  . Smoking status: Never Smoker  . Smokeless tobacco: Never Used  Substance and Sexual Activity  . Alcohol use: Yes    Comment:  once a month  . Drug use: No  . Sexual activity: Yes  Other Topics Concern  . Not on file  Social History Narrative  . Not on file   Social Determinants of Health   Financial Resource Strain: Low Risk   . Difficulty of Paying Living Expenses: Not hard at all  Food Insecurity: No Food Insecurity  . Worried About Charity fundraiser in the Last Year: Never true  . Ran Out of Food in the Last Year: Never true  Transportation Needs: No Transportation Needs  . Lack of Transportation (Medical): No  . Lack of Transportation (Non-Medical): No  Physical Activity: Insufficiently Active  . Days of Exercise per Week: 4 days  . Minutes of Exercise per Session: 30 min  Stress: Stress Concern Present  . Feeling of Stress : To some extent  Social Connections:   . Frequency of Communication with Friends and Family:   . Frequency of Social Gatherings with Friends and Family:   . Attends Religious Services:   . Active Member of Clubs or Organizations:   . Attends Archivist Meetings:   Marland Kitchen Marital Status:      Family History: The patient's family history includes CAD in her mother; Cancer in her paternal grandfather; Colon cancer in her father and another family member; Heart attack in her maternal grandfather, mother, and another family member; Heart disease in an other family member; Heart failure in her maternal grandmother; High blood pressure in her mother; Hyperlipidemia in her mother and sister.  ROS:   Please see the history of present illness.    She has gained weight.  Stress  associated with her current job.  No personal time.  Has received the COVID-19 vaccine.  All other systems reviewed and are negative.  EKGs/Labs/Other Studies Reviewed:    The following studies were reviewed today: No new data  EKG:  EKG not repeated  Recent Labs: 04/04/2019: ALT 17 04/09/2019: Magnesium 3.0 04/21/2019: BUN 11; Creatinine, Ser 0.78; Potassium 3.8; Sodium 138 04/29/2019: Hemoglobin 12.6; Platelets 566  Recent Lipid Panel    Component Value Date/Time   CHOL 80 (L) 02/06/2019 0955   TRIG 75 02/06/2019 0955   HDL 33 (L) 02/06/2019 0955   CHOLHDL 2.4 02/06/2019 0955   CHOLHDL 4.4 12/16/2018 0629   VLDL 15 12/16/2018 0629   LDLCALC 32 02/06/2019 0955    Physical Exam:    VS:  BP 116/64   Pulse 68   Ht 4\' 11"  (1.499 m)  Wt 190 lb 12.8 oz (86.5 kg)   SpO2 99%   BMI 38.54 kg/m     Wt Readings from Last 3 Encounters:  02/11/20 190 lb 12.8 oz (86.5 kg)  08/02/19 180 lb 12.4 oz (82 kg)  07/31/19 180 lb (81.6 kg)     GEN: Moderate obesity. No acute distress HEENT: Normal NECK: No JVD. LYMPHATICS: No lymphadenopathy CARDIAC:  RRR without murmur, gallop, or edema. VASCULAR:  Normal Pulses. No bruits. RESPIRATORY:  Clear to auscultation without rales, wheezing or rhonchi  ABDOMEN: Soft, non-tender, non-distended, No pulsatile mass, MUSCULOSKELETAL: No deformity  SKIN: Warm and dry NEUROLOGIC:  Alert and oriented x 3 PSYCHIATRIC:  Normal affect   ASSESSMENT:    1. S/P CABG x 2   2. Pericarditis, unspecified chronicity, unspecified type   3. Hyperlipidemia with target LDL less than 70   4. Essential hypertension   5. Morbid obesity ([place])   6. Educated about COVID-19 virus infection    PLAN:    In order of problems listed above:  1. Secondary prevention discussed. 2. No recurrence 3. Target LDL less than 70.  Continue atorvastatin 80 mg/day.  Liver and lipid panel in August 2021. 4. Excellent blood pressure control.  Continue metoprolol tartrate 25 mg  twice daily. 5. Gaining weight.  She is changing jobs because she needs more time to take care of herself including exercise.  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 6. COVID-19 vaccine has been received.  Social distancing is being practiced.   Overall education and awareness concerning primary/secondary risk prevention was discussed in detail: LDL less than 70, hemoglobin A1c less than 7, blood pressure target less than 130/80 mmHg, >150 minutes of moderate aerobic activity per week, avoidance of smoking, weight control (via diet and exercise), and continued surveillance/management of/for obstructive sleep apnea.   Medication Adjustments/Labs and Tests Ordered: Current medicines are reviewed at length with the patient today.  Concerns regarding medicines are outlined above.  Orders Placed This Encounter  Procedures  . Lipid panel  . Hepatic function panel  . HgB A1c  . Basic metabolic panel   No orders of the defined types were placed in this encounter.   Patient Instructions  Medication Instructions:  Your physician recommends that you continue on your current medications as directed. Please refer to the Current Medication list given to you today.  *If you need a refill on your cardiac medications before your next appointment, please call your pharmacy*   Lab Work: Lipid, Liver, BMET and A1c in August or September.  You will need to be fasting for these labs.   If you have labs (blood work) drawn today and your tests are completely normal, you will receive your results only by: Marland Kitchen MyChart Message (if you have MyChart) OR . A paper copy in the mail If you have any lab test that is abnormal or we need to change your treatment, we will call you to review the results.   Testing/Procedures: None   Follow-Up: At [facility], you and your health needs are our priority.  As part of our  continuing mission to provide you with exceptional heart care, we have created designated Provider Care Teams.  These Care Teams include your primary Cardiologist (physician) and Advanced Practice Providers (APPs -  Physician Assistants and Nurse Practitioners) who all work together to provide you with the care you need, when you need  it.  We recommend signing up for the patient portal called "MyChart".  Sign up information is provided on this After Visit Summary.  MyChart is used to connect with patients for Virtual Visits (Telemedicine).  Patients are able to view lab/test results, encounter notes, upcoming appointments, etc.  Non-urgent messages can be sent to your provider as well.   To learn more about what you can do with MyChart, go to [email or web address].    Your next appointment:   9 month(s)  The format for your next appointment:   In Person  Provider:   You may see Sinclair Grooms, MD or one of the following Advanced Practice Providers on your designated Care Team:    Truitt Merle, NP  Cecilie Kicks, NP  Kathyrn Drown, NP    Other Instructions      Signed, Sinclair Grooms, MD  02/11/2020 4:03 PM    Victoria Vera

## 2020-02-11 NOTE — Patient Instructions (Signed)
Medication Instructions:  Your physician recommends that you continue on your current medications as directed. Please refer to the Current Medication list given to you today.  *If you need a refill on your cardiac medications before your next appointment, please call your pharmacy*   Lab Work: Lipid, Liver, BMET and A1c in August or September.  You will need to be fasting for these labs.   If you have labs (blood work) drawn today and your tests are completely normal, you will receive your results only by: Marland Kitchen MyChart Message (if you have MyChart) OR . A paper copy in the mail If you have any lab test that is abnormal or we need to change your treatment, we will call you to review the results.   Testing/Procedures: None   Follow-Up: At [facility], you and your health needs are our priority.  As part of our continuing mission to provide you with exceptional heart care, we have created designated Provider Care Teams.  These Care Teams include your primary Cardiologist (physician) and Advanced Practice Providers (APPs -  Physician Assistants and Nurse Practitioners) who all work together to provide you with the care you need, when you need it.  We recommend signing up for the patient portal called "MyChart".  Sign up information is provided on this After Visit Summary.  MyChart is used to connect with patients for Virtual Visits (Telemedicine).  Patients are able to view lab/test results, encounter notes, upcoming appointments, etc.  Non-urgent messages can be sent to your provider as well.   To learn more about what you can do with MyChart, go to [email or web address].    Your next appointment:   9 month(s)  The format for your next appointment:   In Person  Provider:   You may see Sinclair Grooms, MD or one of the following Advanced Practice Providers on your designated Care Team:    Truitt Merle, NP  Cecilie Kicks, NP  Kathyrn Drown, NP    Other Instructions

## 2020-02-13 MED FILL — METOPROLOL TARTRATE 25 MG T: 25 | 90 days supply | Qty: 180 | Fill #2

## 2020-02-18 DIAGNOSIS — Z6838 Body mass index (BMI) 38.0-38.9, adult: Secondary | ICD-10-CM | POA: Diagnosis not present

## 2020-02-18 DIAGNOSIS — Z1231 Encounter for screening mammogram for malignant neoplasm of breast: Secondary | ICD-10-CM | POA: Diagnosis not present

## 2020-02-18 DIAGNOSIS — Z01419 Encounter for gynecological examination (general) (routine) without abnormal findings: Secondary | ICD-10-CM | POA: Diagnosis not present

## 2020-03-03 DIAGNOSIS — N926 Irregular menstruation, unspecified: Secondary | ICD-10-CM | POA: Diagnosis not present

## 2020-03-03 DIAGNOSIS — Z13820 Encounter for screening for osteoporosis: Secondary | ICD-10-CM | POA: Diagnosis not present

## 2020-03-03 DIAGNOSIS — N939 Abnormal uterine and vaginal bleeding, unspecified: Secondary | ICD-10-CM | POA: Diagnosis not present

## 2020-04-20 MED FILL — ATORVASTATIN 80 MG TABLET: 80 | 90 days supply | Qty: 90 | Fill #1

## 2020-05-19 MED FILL — METOPROLOL TARTRATE 25 MG T: 25 | 90 days supply | Qty: 180 | Fill #3

## 2020-05-23 ENCOUNTER — Other Ambulatory Visit: Payer: Self-pay

## 2020-05-23 ENCOUNTER — Encounter (HOSPITAL_COMMUNITY): Payer: Self-pay | Admitting: Emergency Medicine

## 2020-05-23 ENCOUNTER — Emergency Department (HOSPITAL_COMMUNITY): Payer: 59

## 2020-05-23 ENCOUNTER — Emergency Department (HOSPITAL_COMMUNITY)
Admission: EM | Admit: 2020-05-23 | Discharge: 2020-05-23 | Disposition: A | Discharge status: Home / Self Care | Payer: 59 | Attending: Emergency Medicine | Admitting: Emergency Medicine

## 2020-05-23 DIAGNOSIS — I5041 Acute combined systolic (congestive) and diastolic (congestive) heart failure: Secondary | ICD-10-CM | POA: Insufficient documentation

## 2020-05-23 DIAGNOSIS — I2510 Atherosclerotic heart disease of native coronary artery without angina pectoris: Secondary | ICD-10-CM | POA: Diagnosis not present

## 2020-05-23 DIAGNOSIS — Z951 Presence of aortocoronary bypass graft: Secondary | ICD-10-CM | POA: Diagnosis not present

## 2020-05-23 DIAGNOSIS — R079 Chest pain, unspecified: Secondary | ICD-10-CM | POA: Diagnosis present

## 2020-05-23 DIAGNOSIS — R0602 Shortness of breath: Secondary | ICD-10-CM | POA: Diagnosis not present

## 2020-05-23 DIAGNOSIS — Z7982 Long term (current) use of aspirin: Secondary | ICD-10-CM | POA: Insufficient documentation

## 2020-05-23 DIAGNOSIS — R072 Precordial pain: Secondary | ICD-10-CM | POA: Diagnosis not present

## 2020-05-23 LAB — I-STAT BETA HCG BLOOD, ED (MC, WL, AP ONLY): I-stat hCG, quantitative: <5 m[IU]/mL (ref <5)

## 2020-05-23 LAB — CBC
HCT: 40.6 % (ref 36.0–46.0)
Hemoglobin: 13 g/dL (ref 12.0–15.0)
MCH: 30.6 pg (ref 26.0–34.0)
MCHC: 32 g/dL (ref 30.0–36.0)
MCV: 95.5 fL (ref 80.0–100.0)
Platelets: 351 10*3/uL (ref 150–400)
RBC: 4.25 MIL/uL (ref 3.87–5.11)
RDW: 12.8 % (ref 11.5–15.5)
WBC: 13.8 10*3/uL — ABNORMAL HIGH (ref 4.0–10.5)
nRBC: 0 % (ref 0.0–0.2)

## 2020-05-23 LAB — BASIC METABOLIC PANEL
Anion gap: 11 (ref 5–15)
BUN: 19 mg/dL (ref 6–20)
CO2: 27 mmol/L (ref 22–32)
Calcium: 10.5 mg/dL — ABNORMAL HIGH (ref 8.9–10.3)
Chloride: 101 mmol/L (ref 98–111)
Creatinine, Ser: 0.8 mg/dL (ref 0.44–1.00)
GFR calc Af Amer: >60 mL/min (ref >60)
GFR calc non Af Amer: >60 mL/min (ref >60)
Glucose, Bld: 112 mg/dL — ABNORMAL HIGH (ref 70–99)
Potassium: 3.7 mmol/L (ref 3.5–5.1)
Sodium: 139 mmol/L (ref 135–145)

## 2020-05-23 LAB — TROPONIN I (HIGH SENSITIVITY)
Troponin I (High Sensitivity): 4 ng/L (ref <18)
Troponin I (High Sensitivity): 3 ng/L (ref <18)

## 2020-05-23 MED ORDER — SODIUM CHLORIDE 0.9% FLUSH
3.0000 mL | Freq: Once | INTRAVENOUS | Status: AC
  Administered 2020-05-23: 3 mL via INTRAVENOUS

## 2020-05-23 MED ORDER — LIDOCAINE VISCOUS HCL 2 % MT SOLN
15.0000 mL | Freq: Once | OROMUCOSAL | Status: AC
  Administered 2020-05-23: 15 mL via ORAL
  Filled 2020-05-23: qty 15

## 2020-05-23 MED ORDER — DIAZEPAM 5 MG/ML IJ SOLN
2.5000 mg | Freq: Once | INTRAMUSCULAR | Status: AC
  Administered 2020-05-23: 2.5 mg via INTRAVENOUS
  Filled 2020-05-23: qty 2

## 2020-05-23 MED ORDER — HYOSCYAMINE SULFATE 0.125 MG SL SUBL
0.2500 mg | SUBLINGUAL_TABLET | Freq: Once | SUBLINGUAL | Status: AC
  Administered 2020-05-23: 0.25 mg via SUBLINGUAL
  Filled 2020-05-23: qty 2

## 2020-05-23 MED ORDER — ALUM & MAG HYDROXIDE-SIMETH 200-200-20 MG/5ML PO SUSP
30.0000 mL | Freq: Once | ORAL | Status: AC
  Administered 2020-05-23: 30 mL via ORAL
  Filled 2020-05-23: qty 30

## 2020-05-23 MED ORDER — DIAZEPAM 5 MG PO TABS: 2.5000 mg | ORAL_TABLET | Freq: Three times a day (TID) | ORAL | 0 refills | Status: AC | PRN

## 2020-05-23 MED ORDER — LIDOCAINE VISCOUS HCL 2 % MT SOLN
15.0000 mL | Freq: Four times a day (QID) | OROMUCOSAL | 0 refills | Status: DC | PRN
Start: 2020-05-23 — End: 2020-11-24

## 2020-05-23 NOTE — ED Triage Notes (Signed)
Pt c/o central, non-radiating chest pain that started approx. 2 hours pta, also c/o shortness of breath. Pt reports taking a nitro 20 minutes ago with minimal relief. Hx of MI and open heart surgery.

## 2020-05-23 NOTE — ED Provider Notes (Signed)
[facility] EMERGENCY DEPARTMENT Provider Note  CSN: [id number] Arrival date & time: 05/23/20 6256  Chief Complaint(s) Chest Pain  HPI [name] is a 54 y.o. female with a past medical history listed below including CAD status post NSTEMI requiring stents and CABG x2 in 2020 who presents today for central chest pain described as a cramping sensation.  Patient reports that she has had symptoms of acid reflux all day.  Around 7 PM she noted the cramping sensation which was initially mild.  1 to 2 hours after eating dinner, pain worsened.  Pain is intermittent and sporadic.  Nonexertional.  Nonradiating.  Patient endorses shortness of breath related to the severity of the pain.  No nausea or vomiting.  No abdominal pain.  No recent fevers or infections.  No cough or congestion.  No other physical complaints.  Patient reports that this is different from her prior cardiac pain. Patient attempted to take Tums, aspirin and nitroglycerin with no relief.   HPI  Past Medical History Past Medical History:  Diagnosis Date  . Acute non-ST elevation myocardial infarction (NSTEMI) ([place]) 12/16/2018  . CAD S/P DES PCI -pLAD 12/16/2018   Cath 12/15/2017: Successful proximal LAD synergy 3.0 x 15 DES postdilated to 3.25 mm reducing the stenosis to 0% ; ? 60-70% ostial LAD noted on some images.  . Hyperlipidemia   . PONV (postoperative nausea and vomiting)    Patient Active Problem List   Diagnosis Date Noted  . Chest pain 04/22/2019  . NSTEMI (non-ST elevated myocardial infarction) ([place]) 04/22/2019  . Elevated troponin   . 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 Morbid obesity ([place]) 12/18/2018  . Ischemic cardiomyopathy -in setting of anterior non-STEMI 12/17/2018  . Acute combined systolic and diastolic heart failure ([place]) 12/17/2018  . Acute non-ST elevation myocardial infarction (NSTEMI) ([place]) 12/16/2018  . CAD S/P DES PCI -pLAD 12/16/2018  . Hyperlipidemia with target LDL less than 70  12/16/2018  . Traumatic fracture of spine at T12-L1 level, sequela 03/27/2014  . Encounter for IUD removal 05/16/2013    Class: Status post  . S/P tubal ligation 05/16/2013    Class: Status post  . IRREGULAR MENSES 09/29/2009   Home Medication(s) Prior to Admission medications   Medication Sig Start Date End Date Taking? Authorizing Provider  aspirin 81 MG chewable tablet Chew 1 tablet (81 mg total) by mouth daily. 12/19/18  Yes Belva Crome, MD  atorvastatin (LIPITOR) 80 MG tablet TAKE 1 TABLET (80 MG TOTAL) BY MOUTH DAILY AT 6 PM. 01/20/20  Yes Belva Crome, MD  loratadine (CLARITIN) 10 MG tablet Take 10 mg by mouth daily as needed for allergies.   Yes [provider]  metoprolol tartrate (LOPRESSOR) 25 MG tablet Take 1 tablet (25 mg total) by mouth 2 (two) times daily. 07/31/19  Yes Belva Crome, MD  nitroGLYCERIN (NITROSTAT) 0.4 MG SL tablet PLACE 1 TABLET UNDER THE TONGUE EVERY FIVE MINUTES AS NEEDED FOR CHEST PAIN. Patient taking differently: Place 0.4 mg under the tongue every 5 (five) minutes as needed.  07/19/19  Yes Belva Crome, MD  diazepam (VALIUM) 5 MG tablet Take 0.5-1 tablets (2.5-5 mg total) by mouth every 8 (eight) hours as needed for up to 5 days for anxiety. 05/23/20 05/28/20  Fatima Blank, MD  lidocaine (XYLOCAINE) 2 % solution Use as directed 15 mLs in the mouth or throat every 6 (six) hours as needed for mouth pain. 05/23/20   Dixon Luczak, Grayce Sessions,  MD                                                                                                                                    Past Surgical History Past Surgical History:  Procedure Laterality Date  . CARDIAC CATHETERIZATION    . CESAREAN SECTION    . CORONARY ARTERY BYPASS GRAFT N/A 04/08/2019   Procedure: CORONARY ARTERY BYPASS GRAFTING (CABG) x two , using left internal mammary artery and right leg greater saphenous vein harvested endoscopically;  Surgeon: Ivin Poot, MD;  Location: [place];   Service: Open Heart Surgery;  Laterality: N/A;  . CORONARY/GRAFT ACUTE MI REVASCULARIZATION N/A 12/16/2018   Procedure: Coronary/Graft Acute MI Revascularization;  Surgeon: Belva Crome, MD;  Location: [place] [facility];  Service: Cardiovascular;  Laterality: N/A;  . IUD REMOVAL N/A 05/16/2013   Procedure: INTRAUTERINE DEVICE (IUD) REMOVAL;  Surgeon: Darlyn Chamber, MD;  Location: [employer] [facility];  Service: Gynecology;  Laterality: N/A;  . LAPAROSCOPIC TUBAL LIGATION Bilateral 05/16/2013   Procedure: LAPAROSCOPIC TUBAL LIGATION;  Surgeon: Darlyn Chamber, MD;  Location: [place] [facility];  Service: Gynecology;  Laterality: Bilateral;  . LEFT HEART CATH AND CORONARY ANGIOGRAPHY N/A 12/16/2018   Procedure: LEFT HEART CATH AND CORONARY ANGIOGRAPHY;  Surgeon: Belva Crome, MD;  Location: [place] [facility];  Service: Cardiovascular;  Laterality: N/A;  . LEFT HEART CATH AND CORONARY ANGIOGRAPHY N/A 03/26/2019   Procedure: LEFT HEART CATH AND CORONARY ANGIOGRAPHY;  Surgeon: Belva Crome, MD;  Location: [facility];  Service: Cardiovascular;  Laterality: N/A;  . TEE WITHOUT CARDIOVERSION N/A 04/08/2019   Procedure: TRANSESOPHAGEAL ECHOCARDIOGRAM (TEE);  Surgeon: Prescott Gum, Collier Salina, MD;  Location: [facility];  Service: Open Heart Surgery;  Laterality: N/A;   Family History Family History  Problem Relation Age of Onset  . Heart disease Other   . Colon cancer Other   . Heart attack Other   . Colon cancer Father   . High blood pressure Mother   . Heart attack Mother        2 stents  . CAD Mother   . Hyperlipidemia Mother   . Hyperlipidemia Sister   . Heart failure Maternal Grandmother   . Heart attack Maternal Grandfather        deceased 63  . Cancer Paternal Grandfather     Social History Social History   Tobacco Use  . Smoking status: Never Smoker  . Smokeless tobacco: Never Used  Vaping Use  . Vaping Use: Never used  Substance Use Topics  . Alcohol use: Yes    Comment:  once a month  . Drug use: No    Allergies Tramadol and Tetracycline  Review of Systems Review of Systems All other systems are reviewed and are negative for acute change except as noted in the HPI  Physical Exam Vital Signs  I have reviewed the triage vital signs BP Marland Kitchen)  143/97 (BP Location: Right Arm)   Pulse 86   Temp 98.8 F (37.1 C) (Oral)   Resp (!) 22   Ht 4\' 11"  (1.499 m)   Wt 86.2 kg   SpO2 100%   BMI 38.38 kg/m   Physical Exam Vitals reviewed.  Constitutional:      General: She is not in acute distress.    Appearance: She is well-developed. She is not diaphoretic.  HENT:     Head: Normocephalic and atraumatic.     Nose: Nose normal.  Eyes:     General: No scleral icterus.       Right eye: No discharge.        Left eye: No discharge.     Conjunctiva/sclera: Conjunctivae normal.     Pupils: Pupils are equal, round, and reactive to light.  Cardiovascular:     Rate and Rhythm: Normal rate and regular rhythm.     Heart sounds: No murmur heard.  No friction rub. No gallop.   Pulmonary:     Effort: Pulmonary effort is normal. No respiratory distress.     Breath sounds: Normal breath sounds. No stridor. No rales.  Chest:     Chest wall: Tenderness present.    Abdominal:     General: There is no distension.     Palpations: Abdomen is soft.     Tenderness: There is no abdominal tenderness.  Musculoskeletal:        General: No tenderness.     Cervical back: Normal range of motion and neck supple.  Skin:    General: Skin is warm and dry.     Findings: No erythema or rash.  Neurological:     Mental Status: She is alert and oriented to person, place, and time.     ED Results and Treatments Labs (all labs ordered are listed, but only abnormal results are displayed) Labs Reviewed  BASIC METABOLIC PANEL - Abnormal; Notable for the following components:      Result Value   Glucose, Bld 112 (*)    Calcium 10.5 (*)    All other components within normal limits  CBC - Abnormal; Notable  for the following components:   WBC 13.8 (*)    All other components within normal limits  I-STAT BETA HCG BLOOD, ED ([facility], [facility], [facility] ONLY)  TROPONIN I (HIGH SENSITIVITY)  TROPONIN I (HIGH SENSITIVITY)                                                                                                                         EKG  EKG Interpretation  Date/Time:  Saturday May 23 2020 01:06:56 EDT Ventricular Rate:  89 PR Interval:  122 QRS Duration: 82 QT Interval:  358 QTC Calculation: 435 R Axis:   70 Text Interpretation: Normal sinus rhythm with sinus arrhythmia Normal ECG Otherwise no significant change Confirmed by Addison Lank [phone]) on 05/23/2020 1:59:39 AM      Radiology DG Chest 2 View  Result Date:  05/23/2020 CLINICAL DATA:  Shortness of breath EXAM: CHEST - 2 VIEW COMPARISON:  May 06, 2019 FINDINGS: The heart size and mediastinal contours are within normal limits. Overlying median sternotomy wires are present. Both lungs are clear. The visualized skeletal structures are unremarkable. Again noted a slight superior compression deformity of the L1 vertebral body. IMPRESSION: No active cardiopulmonary disease. Electronically Signed   By: Prudencio Pair M.D.   On: 05/23/2020 01:37    Pertinent labs & imaging results that were available during my care of the patient were reviewed by me and considered in my medical decision making (see chart for details).  Medications Ordered in ED Medications  sodium chloride flush (NS) 0.9 % injection 3 mL (3 mLs Intravenous Given 05/23/20 0148)  alum & mag hydroxide-simeth (MAALOX/MYLANTA) 200-200-20 MG/5ML suspension 30 mL (30 mLs Oral Given 05/23/20 0322)    And  lidocaine (XYLOCAINE) 2 % viscous mouth solution 15 mL (15 mLs Oral Given 05/23/20 0322)  hyoscyamine (LEVSIN SL) SL tablet 0.25 mg (0.25 mg Sublingual Given 05/23/20 0315)  diazepam (VALIUM) injection 2.5 mg (2.5 mg Intravenous Given 05/23/20 0316)                                                                                                                                     Procedures Procedures  (including critical care time)  Medical Decision Making / ED Course I have reviewed the nursing notes for this encounter and the patient's prior records (if available in EHR or on provided paperwork).   [name] was evaluated in Emergency Department on 05/23/2020 for the symptoms described in the history of present illness. She was evaluated in the context of the global COVID-19 pandemic, which necessitated consideration that the patient might be at risk for infection with the SARS-CoV-2 virus that causes COVID-19. Institutional protocols and algorithms that pertain to the evaluation of patients at risk for COVID-19 are in a state of rapid change based on information released by regulatory bodies including the CDC and federal and state organizations. These policies and algorithms were followed during the patient's care in the ED.  Atypical chest pain.  Not consistent with her prior MIs. Patient does have tenderness to palpation of the anterior chest however this pain is different from the cramping chest pain that prompted her visit to the ED. EKG without acute ischemic changes or evidence of pericarditis. Initial troponin negative.  Her presentation is most suspicious for GI related process i.e. esophageal spasms.  I have low suspicion for pulmonary embolism.  Presentation not classic for aortic dissection or esophageal perforation.  Chest x-ray without evidence suggestive of pneumonia, pneumothorax, pneumomediastinum.  No abnormal contour of the mediastinum to suggest dissection. No evidence of acute injuries.  Given her cardiac history, will obtain a second troponin and treat for esophageal spasms in the interim.  After GI cocktail and Valium, the patient symptoms improved significantly.  Delta troponin  was negative.   Final Clinical Impression(s) / ED Diagnoses Final  diagnoses:  Precordial pain   The patient appears reasonably screened and/or stabilized for discharge and I doubt any other medical condition or other [facility] requiring further screening, evaluation, or treatment in the ED at this time prior to discharge. Safe for discharge with strict return precautions.  Disposition: Discharge  Condition: Good  I have discussed the results, Dx and Tx plan with the patient/family who expressed understanding and agree(s) with the plan. Discharge instructions discussed at length. The patient/family was given strict return precautions who verbalized understanding of the instructions. No further questions at time of discharge.    ED Discharge Orders         Ordered    diazepam (VALIUM) 5 MG tablet  Every 8 hours PRN     Discontinue  Reprint     05/23/20 0623    lidocaine (XYLOCAINE) 2 % solution  Every 6 hours PRN     Discontinue  Reprint     05/23/20 0539          [facility] narcotic database reviewed and no active prescriptions noted.   Follow Up: Sueanne Margarita, [place] [address] [phone]  Schedule an appointment as soon as possible for a visit  in 3-5 days, If symptoms do not improve or  worsen  Belva Crome, MD [address] [place] [address] [phone]  Schedule an appointment as soon as possible for a visit  As needed      This chart was dictated using voice recognition software.  Despite best efforts to proofread,  errors can occur which can change the documentation meaning.   Fatima Blank, MD 05/23/20 [id number]

## 2020-05-25 MED FILL — LIDOCAINE 2% VISCOUS SOLN: 2 | 2 days supply | Qty: 100 | Fill #0

## 2020-05-25 MED FILL — diazePAM 5 MG TABS: 5 | 5 days supply | Qty: 15 | Fill #0

## 2020-06-02 ENCOUNTER — Other Ambulatory Visit: Payer: 59

## 2020-06-02 ENCOUNTER — Other Ambulatory Visit: Payer: Self-pay

## 2020-06-02 DIAGNOSIS — I10 Essential (primary) hypertension: Secondary | ICD-10-CM

## 2020-06-02 DIAGNOSIS — E785 Hyperlipidemia, unspecified: Secondary | ICD-10-CM | POA: Diagnosis not present

## 2020-06-03 DIAGNOSIS — R21 Rash and other nonspecific skin eruption: Secondary | ICD-10-CM | POA: Diagnosis not present

## 2020-06-03 DIAGNOSIS — R748 Abnormal levels of other serum enzymes: Secondary | ICD-10-CM | POA: Diagnosis not present

## 2020-06-03 LAB — HEPATIC FUNCTION PANEL
ALT: 16 IU/L (ref 0–32)
AST: 14 IU/L (ref 0–40)
Albumin: 4.1 g/dL (ref 3.8–4.9)
Alkaline Phosphatase: 149 IU/L — ABNORMAL HIGH (ref 48–121)
Bilirubin Total: 0.8 mg/dL (ref 0.0–1.2)
Bilirubin, Direct: 0.23 mg/dL (ref 0.00–0.40)
Total Protein: 6.3 g/dL (ref 6.0–8.5)

## 2020-06-03 LAB — BASIC METABOLIC PANEL
BUN/Creatinine Ratio: 23 (ref 9–23)
BUN: 17 mg/dL (ref 6–24)
CO2: 26 mmol/L (ref 20–29)
Calcium: 9.3 mg/dL (ref 8.7–10.2)
Chloride: 101 mmol/L (ref 96–106)
Creatinine, Ser: 0.75 mg/dL (ref 0.57–1.00)
GFR calc Af Amer: 107 mL/min/{1.73_m2} (ref >59)
GFR calc non Af Amer: 93 mL/min/{1.73_m2} (ref >59)
Glucose: 90 mg/dL (ref 65–99)
Potassium: 4.2 mmol/L (ref 3.5–5.2)
Sodium: 139 mmol/L (ref 134–144)

## 2020-06-03 LAB — LIPID PANEL
Chol/HDL Ratio: 2.5 ratio (ref 0.0–4.4)
Cholesterol, Total: 99 mg/dL — ABNORMAL LOW (ref 100–199)
HDL: 39 mg/dL — ABNORMAL LOW (ref >39)
LDL Chol Calc (NIH): 42 mg/dL (ref 0–99)
Triglycerides: 89 mg/dL (ref 0–149)
VLDL Cholesterol Cal: 18 mg/dL (ref 5–40)

## 2020-06-03 LAB — HEMOGLOBIN A1C
Est. average glucose Bld gHb Est-mCnc: 111 mg/dL
Hgb A1c MFr Bld: 5.5 % (ref 4.8–5.6)

## 2020-06-18 DIAGNOSIS — E785 Hyperlipidemia, unspecified: Secondary | ICD-10-CM | POA: Diagnosis not present

## 2020-06-18 DIAGNOSIS — R748 Abnormal levels of other serum enzymes: Secondary | ICD-10-CM | POA: Diagnosis not present

## 2020-07-16 ENCOUNTER — Other Ambulatory Visit: Payer: Self-pay | Admitting: Interventional Cardiology

## 2020-07-16 MED FILL — ATORVASTATIN 80 MG TABLET: 80 | 90 days supply | Qty: 90 | Fill #0

## 2020-07-21 DIAGNOSIS — E785 Hyperlipidemia, unspecified: Secondary | ICD-10-CM | POA: Diagnosis not present

## 2020-07-21 DIAGNOSIS — Z0000 Encounter for general adult medical examination without abnormal findings: Secondary | ICD-10-CM | POA: Diagnosis not present

## 2020-07-22 ENCOUNTER — Other Ambulatory Visit (HOSPITAL_COMMUNITY): Payer: Self-pay | Admitting: Internal Medicine

## 2020-07-22 DIAGNOSIS — R0602 Shortness of breath: Secondary | ICD-10-CM | POA: Diagnosis not present

## 2020-07-22 DIAGNOSIS — R82998 Other abnormal findings in urine: Secondary | ICD-10-CM | POA: Diagnosis not present

## 2020-07-22 DIAGNOSIS — I25728 Atherosclerosis of autologous artery coronary artery bypass graft(s) with other forms of angina pectoris: Secondary | ICD-10-CM | POA: Diagnosis not present

## 2020-07-22 DIAGNOSIS — I519 Heart disease, unspecified: Secondary | ICD-10-CM | POA: Diagnosis not present

## 2020-07-22 DIAGNOSIS — Z0000 Encounter for general adult medical examination without abnormal findings: Secondary | ICD-10-CM | POA: Diagnosis not present

## 2020-07-22 DIAGNOSIS — R12 Heartburn: Secondary | ICD-10-CM | POA: Diagnosis not present

## 2020-07-22 DIAGNOSIS — E785 Hyperlipidemia, unspecified: Secondary | ICD-10-CM | POA: Diagnosis not present

## 2020-07-22 MED FILL — ALBUTEROL SULFATE HFA 108 (: 108 (90 BAS | 17 days supply | Qty: 9 | Fill #0

## 2020-07-22 MED FILL — OMEPRAZOLE 40 MG CPDR: 40 | 90 days supply | Qty: 90 | Fill #0

## 2020-07-28 DIAGNOSIS — Z1212 Encounter for screening for malignant neoplasm of rectum: Secondary | ICD-10-CM | POA: Diagnosis not present

## 2020-07-28 LAB — IFOBT (OCCULT BLOOD): IFOBT: NEGATIVE

## 2020-08-07 ENCOUNTER — Ambulatory Visit: Payer: 59 | Attending: Internal Medicine

## 2020-08-07 DIAGNOSIS — Z23 Encounter for immunization: Secondary | ICD-10-CM

## 2020-08-07 NOTE — Progress Notes (Signed)
   Covid-19 Vaccination Clinic  Name:  [name]    MRN: [id number] DOB: 12/12/1965  08/07/2020  [name] was observed post Covid-19 immunization for 15 minutes without incident. She was provided with Vaccine Information Sheet and instruction to access the V-Safe system.   [name] was instructed to call [phone] with any severe reactions post vaccine: Marland Kitchen Difficulty breathing  . Swelling of face and throat  . A fast heartbeat  . A bad rash all over body  . Dizziness and weakness

## 2020-08-11 ENCOUNTER — Other Ambulatory Visit: Payer: Self-pay | Admitting: Interventional Cardiology

## 2020-08-11 MED FILL — METOPROLOL TARTRATE 25 MG T: 25 | 90 days supply | Qty: 180 | Fill #0

## 2020-10-13 MED FILL — ATORVASTATIN 80 MG TABLET: 80 | 90 days supply | Qty: 90 | Fill #1

## 2020-10-22 DIAGNOSIS — L299 Pruritus, unspecified: Secondary | ICD-10-CM | POA: Diagnosis not present

## 2020-10-22 DIAGNOSIS — Z20822 Contact with and (suspected) exposure to covid-19: Secondary | ICD-10-CM | POA: Diagnosis not present

## 2020-10-22 DIAGNOSIS — R1013 Epigastric pain: Secondary | ICD-10-CM | POA: Diagnosis not present

## 2020-10-23 ENCOUNTER — Other Ambulatory Visit: Payer: Self-pay | Admitting: Internal Medicine

## 2020-10-23 DIAGNOSIS — R1013 Epigastric pain: Secondary | ICD-10-CM

## 2020-10-23 DIAGNOSIS — Z20822 Contact with and (suspected) exposure to covid-19: Secondary | ICD-10-CM | POA: Diagnosis not present

## 2020-11-09 ENCOUNTER — Ambulatory Visit
Admission: RE | Admit: 2020-11-09 | Discharge: 2020-11-09 | Disposition: A | Discharge status: Home / Self Care | Payer: 59 | Source: Ambulatory Visit | Attending: Internal Medicine | Admitting: Internal Medicine

## 2020-11-09 DIAGNOSIS — K7689 Other specified diseases of liver: Secondary | ICD-10-CM | POA: Diagnosis not present

## 2020-11-09 DIAGNOSIS — R1013 Epigastric pain: Secondary | ICD-10-CM

## 2020-11-13 MED FILL — METOPROLOL TARTRATE 25 MG T: 25 | 90 days supply | Qty: 180 | Fill #1

## 2020-11-17 ENCOUNTER — Other Ambulatory Visit: Payer: Self-pay | Admitting: Internal Medicine

## 2020-11-17 DIAGNOSIS — K7689 Other specified diseases of liver: Secondary | ICD-10-CM

## 2020-11-17 DIAGNOSIS — K760 Fatty (change of) liver, not elsewhere classified: Secondary | ICD-10-CM

## 2020-11-22 NOTE — Progress Notes (Signed)
Cardiology Office Note:    Date:  11/22/2020   ID:  [name], DOB 1966/03/21, MRN [id number]  PCP:  Taylor Margarita, DO  Cardiologist:  Taylor Grooms, MD   Referring MD: Taylor Margarita, DO   No chief complaint on file.   History of Present Illness:    [name] is a 55 y.o. female with a hx of CAD withanteriorSTEMI in March that was treated with DES to the LAD,HTN. Subsequent progressive exertionalchest painand dyspnea led to re-cath and severe ostial LAD progression -->2 vessel CABG with LIMA to LAD and SVG to DX6/22/2020.This bypass pericarditis cleared with colchicine (GI upset) and ibuprofen.  [name] is doing well.  She denies angina.  She is sad by the fact that weight gain has occurred.  An ultrasound of her abdomen was done as part of a work-up for reflux and demonstrated fatty steatosis of the liver.  Her new job requires that she work from 10 AM to 7 or 8 PM 5 days a week.  She is not getting an opportunity to exercise.  She denies a healthy diet because work requires that she eats quickly prepared food.  She has been somewhat depressed by this.  Overall, no cardiac symptoms.  No medication side effects.  Past Medical History:  Diagnosis Date  . Acute non-ST elevation myocardial infarction (NSTEMI) ([place]) 12/16/2018  . CAD S/P DES PCI -pLAD 12/16/2018   Cath 12/15/2017: Successful proximal LAD synergy 3.0 x 15 DES postdilated to 3.25 mm reducing the stenosis to 0% ; ? 60-70% ostial LAD noted on some images.  . Hyperlipidemia   . PONV (postoperative nausea and vomiting)     Past Surgical History:  Procedure Laterality Date  . CARDIAC CATHETERIZATION    . CESAREAN SECTION    . CORONARY ARTERY BYPASS GRAFT N/A 04/08/2019   Procedure: CORONARY ARTERY BYPASS GRAFTING (CABG) x two , using left internal mammary artery and right leg greater saphenous vein harvested endoscopically;  Surgeon: Taylor Poot, MD;  Location: [facility];  Service: Open Heart Surgery;   Laterality: N/A;  . CORONARY/GRAFT ACUTE MI REVASCULARIZATION N/A 12/16/2018   Procedure: Coronary/Graft Acute MI Revascularization;  Surgeon: Taylor Crome, MD;  Location: [facility] [facility];  Service: Cardiovascular;  Laterality: N/A;  . IUD REMOVAL N/A 05/16/2013   Procedure: INTRAUTERINE DEVICE (IUD) REMOVAL;  Surgeon: Taylor Chamber, MD;  Location: [facility];  Service: Gynecology;  Laterality: N/A;  . LAPAROSCOPIC TUBAL LIGATION Bilateral 05/16/2013   Procedure: LAPAROSCOPIC TUBAL LIGATION;  Surgeon: Taylor Chamber, MD;  Location: [facility];  Service: Gynecology;  Laterality: Bilateral;  . LEFT HEART CATH AND CORONARY ANGIOGRAPHY N/A 12/16/2018   Procedure: LEFT HEART CATH AND CORONARY ANGIOGRAPHY;  Surgeon: Taylor Crome, MD;  Location: [facility];  Service: Cardiovascular;  Laterality: N/A;  . LEFT HEART CATH AND CORONARY ANGIOGRAPHY N/A 03/26/2019   Procedure: LEFT HEART CATH AND CORONARY ANGIOGRAPHY;  Surgeon: Taylor Crome, MD;  Location: [facility];  Service: Cardiovascular;  Laterality: N/A;  . TEE WITHOUT CARDIOVERSION N/A 04/08/2019   Procedure: TRANSESOPHAGEAL ECHOCARDIOGRAM (TEE);  Surgeon: [name], Taylor Salina, MD;  Location: [facility];  Service: Open Heart Surgery;  Laterality: N/A;    Current Medications: No outpatient medications have been marked as taking for the 11/24/20 encounter (Appointment) with Taylor Crome, MD.     Allergies:   Tramadol and Tetracycline   Social History   Socioeconomic History  . Marital status:  Married    Spouse name: Not on file  . Number of children: Not on file  . Years of education: Not on file  . Highest education level: Bachelor's degree (e.g., BA, AB, BS)  Occupational History  . Not on file  Tobacco Use  . Smoking status: Never Smoker  . Smokeless tobacco: Never Used  Vaping Use  . Vaping Use: Never used  Substance and Sexual Activity  . Alcohol use: Yes    Comment:  once a month  . Drug use: No  . Sexual activity: Yes  Other  Topics Concern  . Not on file  Social History Narrative  . Not on file   Social Determinants of Health   Financial Resource Strain: Not on file  Food Insecurity: Not on file  Transportation Needs: Not on file  Physical Activity: Not on file  Stress: Not on file  Social Connections: Not on file     Family History: The patient's family history includes CAD in her mother; Cancer in her paternal grandfather; Colon cancer in her father and another family member; Heart attack in her maternal grandfather, mother, and another family member; Heart disease in an other family member; Heart failure in her maternal grandmother; High blood pressure in her mother; Hyperlipidemia in her mother and sister.  ROS:   Please see the history of present illness.    Acid reflux.  Hepatic cyst.  Fatty steatosis noted.  Weight gain, progressively sedentary lifestyle.  All other systems reviewed and are negative.  EKGs/Labs/Other Studies Reviewed:    The following studies were reviewed today: No new data  EKG:  EKG not repeated  Recent Labs: 05/23/2020: Hemoglobin 13.0; Platelets 351 06/02/2020: ALT 16; BUN 17; Creatinine, Ser 0.75; Potassium 4.2; Sodium 139  Recent Lipid Panel    Component Value Date/Time   CHOL 99 (L) 06/02/2020 0758   TRIG 89 06/02/2020 0758   HDL 39 (L) 06/02/2020 0758   CHOLHDL 2.5 06/02/2020 0758   CHOLHDL 4.4 12/16/2018 0629   VLDL 15 12/16/2018 0629   LDLCALC 42 06/02/2020 0758    Physical Exam:    VS:  There were no vitals taken for this visit.    Wt Readings from Last 3 Encounters:  05/23/20 190 lb (86.2 kg)  02/11/20 190 lb 12.8 oz (86.5 kg)  08/02/19 180 lb 12.4 oz (82 kg)     GEN: Obese. No acute distress HEENT: Normal NECK: No JVD. LYMPHATICS: No lymphadenopathy CARDIAC: No murmur. RRR no gallop, or edema. VASCULAR:  Normal Pulses. No bruits. RESPIRATORY:  Clear to auscultation without rales, wheezing or rhonchi  ABDOMEN: Soft, non-tender, non-distended,  No pulsatile mass, MUSCULOSKELETAL: No deformity  SKIN: Warm and dry NEUROLOGIC:  Alert and oriented x 3 PSYCHIATRIC:  Normal affect   ASSESSMENT:    1. Coronary artery disease involving native heart without angina pectoris, unspecified vessel or lesion type   2. S/P CABG x 2   3. Hyperlipidemia with target LDL less than 70   4. Essential hypertension   5. Morbid obesity ([place])   6. Pericarditis, unspecified chronicity, unspecified type    PLAN:    In order of problems listed above:  1. Secondary prevention discussed.  Aerobic activity encouraged.  Plant-based diet discussed. 2. Above as noted. 3. LDL is 45 on atorvastatin 80 mg/day. 4. Blood pressure is excellent at 118/64.  Continue metoprolol 25 mg twice daily. 5. Exercise, decrease caloric intake.  Possibly consider bariatric approach to weight control 6. No recurrence of  chest pain 7. Vaccinated and Audiological scientist.  Overall education and awareness concerning primary/secondary risk prevention was discussed in detail: LDL less than 70, hemoglobin A1c less than 7, blood pressure target less than 130/80 mmHg, >150 minutes of moderate aerobic activity per week, avoidance of smoking, weight control (via diet and exercise), and continued surveillance/management of/for obstructive sleep apnea.    Medication Adjustments/Labs and Tests Ordered: Current medicines are reviewed at length with the patient today.  Concerns regarding medicines are outlined above.  No orders of the defined types were placed in this encounter.  No orders of the defined types were placed in this encounter.   There are no Patient Instructions on file for this visit.   Signed, Taylor Grooms, MD  11/22/2020 7:21 PM    [place]

## 2020-11-24 ENCOUNTER — Ambulatory Visit: Payer: 59 | Admitting: Interventional Cardiology

## 2020-11-24 ENCOUNTER — Encounter: Payer: Self-pay | Admitting: Interventional Cardiology

## 2020-11-24 ENCOUNTER — Other Ambulatory Visit: Payer: Self-pay

## 2020-11-24 VITALS — BP 118/64 | HR 75 | Ht 59.0 in | Wt 200.4 lb

## 2020-11-24 DIAGNOSIS — Z951 Presence of aortocoronary bypass graft: Secondary | ICD-10-CM | POA: Diagnosis not present

## 2020-11-24 DIAGNOSIS — I2510 Atherosclerotic heart disease of native coronary artery without angina pectoris: Secondary | ICD-10-CM | POA: Diagnosis not present

## 2020-11-24 DIAGNOSIS — I10 Essential (primary) hypertension: Secondary | ICD-10-CM

## 2020-11-24 DIAGNOSIS — E785 Hyperlipidemia, unspecified: Secondary | ICD-10-CM

## 2020-11-24 DIAGNOSIS — I319 Disease of pericardium, unspecified: Secondary | ICD-10-CM | POA: Diagnosis not present

## 2020-11-24 DIAGNOSIS — Z7189 Other specified counseling: Secondary | ICD-10-CM | POA: Diagnosis not present

## 2020-11-24 NOTE — Patient Instructions (Signed)

## 2020-12-09 ENCOUNTER — Encounter: Payer: Self-pay | Admitting: Internal Medicine

## 2020-12-09 ENCOUNTER — Encounter: Payer: Self-pay | Admitting: *Deleted

## 2020-12-21 DIAGNOSIS — R1013 Epigastric pain: Secondary | ICD-10-CM | POA: Diagnosis not present

## 2020-12-21 DIAGNOSIS — R12 Heartburn: Secondary | ICD-10-CM | POA: Diagnosis not present

## 2020-12-21 DIAGNOSIS — K7689 Other specified diseases of liver: Secondary | ICD-10-CM | POA: Diagnosis not present

## 2020-12-21 DIAGNOSIS — L299 Pruritus, unspecified: Secondary | ICD-10-CM | POA: Diagnosis not present

## 2020-12-21 DIAGNOSIS — I25728 Atherosclerosis of autologous artery coronary artery bypass graft(s) with other forms of angina pectoris: Secondary | ICD-10-CM | POA: Diagnosis not present

## 2020-12-21 DIAGNOSIS — R21 Rash and other nonspecific skin eruption: Secondary | ICD-10-CM | POA: Diagnosis not present

## 2020-12-21 DIAGNOSIS — K760 Fatty (change of) liver, not elsewhere classified: Secondary | ICD-10-CM | POA: Diagnosis not present

## 2021-01-11 ENCOUNTER — Other Ambulatory Visit: Payer: Self-pay | Admitting: Interventional Cardiology

## 2021-01-12 ENCOUNTER — Other Ambulatory Visit: Payer: Self-pay | Admitting: Interventional Cardiology

## 2021-01-12 MED FILL — ATORVASTATIN 80 MG TABLET: 80 | 90 days supply | Qty: 90 | Fill #0

## 2021-01-14 ENCOUNTER — Other Ambulatory Visit (HOSPITAL_COMMUNITY): Payer: Self-pay | Admitting: Internal Medicine

## 2021-01-14 DIAGNOSIS — K760 Fatty (change of) liver, not elsewhere classified: Secondary | ICD-10-CM | POA: Diagnosis not present

## 2021-01-14 DIAGNOSIS — I25728 Atherosclerosis of autologous artery coronary artery bypass graft(s) with other forms of angina pectoris: Secondary | ICD-10-CM | POA: Diagnosis not present

## 2021-01-14 DIAGNOSIS — Z7689 Persons encountering health services in other specified circumstances: Secondary | ICD-10-CM | POA: Diagnosis not present

## 2021-01-14 DIAGNOSIS — R1013 Epigastric pain: Secondary | ICD-10-CM | POA: Diagnosis not present

## 2021-01-14 DIAGNOSIS — L299 Pruritus, unspecified: Secondary | ICD-10-CM | POA: Diagnosis not present

## 2021-01-14 DIAGNOSIS — R12 Heartburn: Secondary | ICD-10-CM | POA: Diagnosis not present

## 2021-01-14 DIAGNOSIS — K7689 Other specified diseases of liver: Secondary | ICD-10-CM | POA: Diagnosis not present

## 2021-01-14 DIAGNOSIS — L508 Other urticaria: Secondary | ICD-10-CM | POA: Diagnosis not present

## 2021-01-14 DIAGNOSIS — R21 Rash and other nonspecific skin eruption: Secondary | ICD-10-CM | POA: Diagnosis not present

## 2021-01-14 DIAGNOSIS — R748 Abnormal levels of other serum enzymes: Secondary | ICD-10-CM | POA: Diagnosis not present

## 2021-01-14 MED FILL — TRIAMCINOLONE 0.1% CREAM: 0.1 | 14 days supply | Qty: 60 | Fill #0

## 2021-01-22 ENCOUNTER — Encounter: Payer: Self-pay | Admitting: *Deleted

## 2021-02-01 ENCOUNTER — Encounter: Payer: Self-pay | Admitting: Internal Medicine

## 2021-02-01 ENCOUNTER — Ambulatory Visit: Payer: 59 | Admitting: Internal Medicine

## 2021-02-01 VITALS — BP 118/68 | HR 71 | Ht 59.0 in | Wt 204.0 lb

## 2021-02-01 DIAGNOSIS — K760 Fatty (change of) liver, not elsewhere classified: Secondary | ICD-10-CM

## 2021-02-01 DIAGNOSIS — L509 Urticaria, unspecified: Secondary | ICD-10-CM | POA: Diagnosis not present

## 2021-02-01 DIAGNOSIS — R768 Other specified abnormal immunological findings in serum: Secondary | ICD-10-CM | POA: Diagnosis not present

## 2021-02-01 DIAGNOSIS — R0789 Other chest pain: Secondary | ICD-10-CM

## 2021-02-01 DIAGNOSIS — K219 Gastro-esophageal reflux disease without esophagitis: Secondary | ICD-10-CM

## 2021-02-01 DIAGNOSIS — Z800 Family history of malignant neoplasm of digestive organs: Secondary | ICD-10-CM

## 2021-02-01 NOTE — Progress Notes (Signed)
[name], female   DOB: 29-Aug-1966, 55 y.o.   MRN: [id number] HPI: [name] is a 55 year old female with a past medical history of fatty liver, family history of colon cancer in her father, CAD status post CABG in 2020 who is seen to evaluate possible GERD with substernal chest pain, possible H. pylori infection as well as fatty liver.  She is here alone today.  She is known to me from her elevated risk screening colonoscopy which I performed in March 2018.  Her father was diagnosed with rectal cancer at age 33.  His colonoscopy revealed a 3 mm hyperplastic transverse polyp that was removed and was otherwise normal.  She reports that she had a myocardial infarction at age 23 and underwent a two-vessel CABG in June 2020.  She has had issues starting after her CABG with what was felt to be acid reflux.  At times she was having traditional heartburn but at other times she was having substernal chest burning pain.  This did not feel like the pressure of her heart attack and did not respond to nitroglycerin.  It has certainly been recurrent and at times she feels like she cannot swallow normally.  She does not have solid food dysphagia.  She has reduced caffeine because this seemed to make the symptoms worse.  At times belching can help relieve this symptom.  She chews Tums which helped occasionally.  She took omeprazole 40 mg a day for some period which did not seem to help very frequently.  She tried Carafate tablets which helped some but then seemed to stop working.  She used Pepcid 20 mg once a day which helped at times but now she is using it as needed.  She feels some upper abdominal bloating sensation which comes and goes.  No early satiety.  No nausea.  Bowel movements have been regular without blood in stool or melena.  She did have an ultrasound which showed fatty liver and also a probable liver cyst.  She has had blood work from primary care with normal liver enzymes.  She had a  positive H. pylori antibody and a H. pylori stool test which was "inconclusive"  She has been having intermittent rash which comes and goes over various parts of her body including her upper extremities, lower extremities back and torso.  This is hive-like and can be itchy.  She has been referred to dermatology which is later this week.  Past Medical History:  Diagnosis Date  . Acute non-ST elevation myocardial infarction (NSTEMI) ([facility]) 12/16/2018  . Atherosclerosis   . CAD S/P DES PCI -pLAD 12/16/2018   Cath 12/15/2017: Successful proximal LAD synergy 3.0 x 15 DES postdilated to 3.25 mm reducing the stenosis to 0% ; ? 60-70% ostial LAD noted on some images.  . Elevated alkaline phosphatase level   . Hepatic steatosis   . Hyperlipidemia   . Hyperplastic colon polyp   . Hypertension   . Pericarditis as complication of acute myocardial infarction ([facility])   . PONV (postoperative nausea and vomiting)     Past Surgical History:  Procedure Laterality Date  . CARDIAC CATHETERIZATION    . CESAREAN SECTION    . CORONARY ARTERY BYPASS GRAFT N/A 04/08/2019   Procedure: CORONARY ARTERY BYPASS GRAFTING (CABG) x two , using left internal mammary artery and right leg greater saphenous vein harvested endoscopically;  Surgeon: Ivin Poot, MD;  Location: [facility];  Service: Open Heart Surgery;  Laterality: N/A;  .  CORONARY/GRAFT ACUTE MI REVASCULARIZATION N/A 12/16/2018   Procedure: Coronary/Graft Acute MI Revascularization;  Surgeon: Belva Crome, MD;  Location: [facility];  Service: Cardiovascular;  Laterality: N/A;  . IUD REMOVAL N/A 05/16/2013   Procedure: INTRAUTERINE DEVICE (IUD) REMOVAL;  Surgeon: Darlyn Chamber, MD;  Location: [facility];  Service: Gynecology;  Laterality: N/A;  . LAPAROSCOPIC TUBAL LIGATION Bilateral 05/16/2013   Procedure: LAPAROSCOPIC TUBAL LIGATION;  Surgeon: Darlyn Chamber, MD;  Location: [facility];  Service: Gynecology;  Laterality: Bilateral;  . LEFT HEART CATH AND CORONARY  ANGIOGRAPHY N/A 12/16/2018   Procedure: LEFT HEART CATH AND CORONARY ANGIOGRAPHY;  Surgeon: Belva Crome, MD;  Location: [facility];  Service: Cardiovascular;  Laterality: N/A;  . LEFT HEART CATH AND CORONARY ANGIOGRAPHY N/A 03/26/2019   Procedure: LEFT HEART CATH AND CORONARY ANGIOGRAPHY;  Surgeon: Belva Crome, MD;  Location: [facility];  Service: Cardiovascular;  Laterality: N/A;  . TEE WITHOUT CARDIOVERSION N/A 04/08/2019   Procedure: TRANSESOPHAGEAL ECHOCARDIOGRAM (TEE);  Surgeon: Prescott Gum, Collier Salina, MD;  Location: [facility];  Service: Open Heart Surgery;  Laterality: N/A;  . TUBAL LIGATION      Outpatient Medications Prior to Visit  Medication Sig Dispense Refill  . albuterol (VENTOLIN HFA) 108 (90 Base) MCG/ACT inhaler INHALE 2 PUFFS INTO THE LUNGS EVERY 4 HOURS AS NEEDED. 8.5 g 1  . aspirin 81 MG chewable tablet Chew 1 tablet (81 mg total) by mouth daily. 30 tablet 0  . atorvastatin (LIPITOR) 80 MG tablet TAKE 1 TABLET (80 MG TOTAL) BY MOUTH DAILY AT 6 PM. (Patient taking differently: Take by mouth daily. at 6pm) 90 tablet 3  . Famotidine (PEPCID AC PO) Take 20 mg by mouth as needed.    . loratadine (CLARITIN) 10 MG tablet Take 10 mg by mouth daily as needed for allergies.    . metoprolol tartrate (LOPRESSOR) 25 MG tablet TAKE 1 TABLET BY MOUTH TWICE DAILY. 180 tablet 1  . nitroGLYCERIN (NITROSTAT) 0.4 MG SL tablet PLACE 1 TABLET UNDER THE TONGUE EVERY FIVE MINUTES AS NEEDED FOR CHEST PAIN. 25 tablet 5  . triamcinolone (KENALOG) 0.1 % APPLY SPOT TREATMENT TWICE DAILY FOR 7-14 DAYS TO SEE IF IMPROVES 60 g 0  . omeprazole (PRILOSEC) 40 MG capsule TAKE 1 CAPSULE BY MOUTH ONCE DAILY 30 MINUTES BEFORE MORNING MEAL FOR 6 TO 8 WEEKS THEN CAN TRY TO WEAN OFF. 90 capsule 0   No facility-administered medications prior to visit.    Allergies  Allergen Reactions  . Tramadol     Made her feel very strange and groggy  . Tetracycline Rash    Family History  Problem Relation Age of  Onset  . Heart disease Other   . Colon cancer Other   . Heart attack Other   . Colon cancer Father 37  . High blood pressure Mother   . Heart attack Mother        2 stents  . CAD Mother   . Hyperlipidemia Mother   . Hyperlipidemia Sister   . Heart disease Brother   . Heart failure Maternal Grandmother   . Heart attack Maternal Grandfather        deceased 68  . Cancer Paternal Grandfather     Social History   Tobacco Use  . Smoking status: Never Smoker  . Smokeless tobacco: Never Used  Vaping Use  . Vaping Use: Never used  Substance Use Topics  . Alcohol use: Yes  Comment:  once a month  . Drug use: No    ROS: As per history of present illness, otherwise negative  BP 118/68   Pulse 71   Ht _0  (1.499 m)   Wt 204 lb (92.5 kg)   LMP 01/20/2021 (Approximate)   SpO2 99%   BMI 41.20 kg/m  Constitutional: Well-developed and well-nourished. No distress. HEENT: Normocephalic and atraumatic. Conjunctivae are normal.  No scleral icterus. Cardiovascular: Normal rate, regular rhythm and intact distal pulses. No M/R/G Pulmonary/chest: Effort normal and breath sounds normal. No wheezing, rales or rhonchi. Abdominal: Soft, nontender, nondistended. Bowel sounds active throughout. There are no masses palpable. No hepatosplenomegaly. Extremities: no clubbing, cyanosis, or edema Neurological: Alert and oriented to person place and time. Skin: Skin is warm and dry.  Psychiatric: Normal mood and affect. Behavior is normal.  RELEVANT LABS AND IMAGING: CBC    Component Value Date/Time   WBC 13.8 (H) 05/23/2020 0118   RBC 4.25 05/23/2020 0118   HGB 13.0 05/23/2020 0118   HGB 12.6 04/29/2019 1241   HCT 40.6 05/23/2020 0118   HCT 39.1 04/29/2019 1241   PLT 351 05/23/2020 0118   PLT 566 (H) 04/29/2019 1241   MCV 95.5 05/23/2020 0118   MCV 96 04/29/2019 1241   MCH 30.6 05/23/2020 0118   MCHC 32.0 05/23/2020 0118   RDW 12.8 05/23/2020 0118   RDW 12.7 04/29/2019 1241    LYMPHSABS 2.5 04/29/2019 1241   MONOABS 1.0 04/22/2019 0326   EOSABS 0.3 04/29/2019 1241   BASOSABS 0.1 04/29/2019 1241   Blood work reviewed from primary care including liver enzymes on 01/15/2021: AST 14, ALT 19, alk phos 131, total bili 1.0  Within the last year the following:  Negative ANA, normal IgG, negative ANCA, negative AMA, normal ferritin  ASSESSMENT/PLAN: 55 year old female with a past medical history of fatty liver, family history of colon cancer in her father, CAD status post CABG in 2020 who is seen to evaluate possible GERD with substernal chest pain, possible H. pylori infection as well as fatty liver.  1.  Question of GERD/substernal chest pain --her chest pain is not exertional and does not respond to nitroglycerin.  She is tried multiple antacids including PPI and H2 blocker which have helped at times but not fully.  Think the question certainly remains as to whether this symptom is GERD related.  We discussed how we might be able to figure this out more objectively. -- Upper endoscopy recommended; we discussed the risk, benefits and alternatives and she is agreeable and wishes to proceed.  If esophagitis is seen then the diagnosis is confirmed. -- Bravo capsule placement if esophagus is normal and there is no objective evidence of GERD; this study should definitively answer over a 48-hour period as to whether she has reflux disease  2.  Positive H. pylori serum antibody --stool antigen also inconclusive.  We will plan gastric biopsies at the time of upper endoscopy to confirm or refute H. pylori diagnosis.  If positive she will need antibiotic treatment  3.  Fatty liver --seen by ultrasound.  No evidence for advanced liver disease or cirrhosis.  She has had additional lab work performed by primary care and there is no evidence for autoimmune liver disease, iron overload.  She does not have NASH as her liver enzymes are very normal.  I have seen 3 different liver enzymes over  the last 8 months and her AST and ALT have been in the teens with each check.  We discussed fatty liver disease and the importance of controlling risk factors including hypertension and hyperlipidemia.  She is on a statin therapy.  She does not have diabetes.  We did discuss the importance of a healthy diet and exercise regimen.  Her bilateral kidney monitored over time  4.  Urticarial rash --she has been referred to dermatology  5.  Family history of rectal cancer in her father --screening colonoscopy recommended at a 5-year interval which for her will be March 2023 -- Repeat screening next March 2023        DJ:SHFWYO, Doreatha Lew [address] [place],   [id number]

## 2021-02-01 NOTE — Patient Instructions (Signed)
Please purchase the following medications over the counter and take as directed: Vitamin E 800 IU once daily  You have been scheduled for an endoscopy with bravo. Please follow written instructions given to you at your visit today. If you use inhalers (even only as needed), please bring them with you on the day of your procedure.  If you are age 53 or younger, your body mass index should be between 19-25. Your Body mass index is 41.2 kg/m. If this is out of the aformentioned range listed, please consider follow up with your Primary Care Provider.    Due to recent changes in healthcare laws, you may see the results of your imaging and laboratory studies on MyChart before your provider has had a chance to review them.  We understand that in some cases there may be results that are confusing or concerning to you. Not all laboratory results come back in the same time frame and the provider may be waiting for multiple results in order to interpret others.  Please give [place] 48 hours in order for your provider to thoroughly review all the results before contacting the office for clarification of your results.

## 2021-02-04 DIAGNOSIS — D225 Melanocytic nevi of trunk: Secondary | ICD-10-CM | POA: Diagnosis not present

## 2021-02-04 DIAGNOSIS — L509 Urticaria, unspecified: Secondary | ICD-10-CM | POA: Diagnosis not present

## 2021-02-04 DIAGNOSIS — D2261 Melanocytic nevi of right upper limb, including shoulder: Secondary | ICD-10-CM | POA: Diagnosis not present

## 2021-02-04 DIAGNOSIS — D2262 Melanocytic nevi of left upper limb, including shoulder: Secondary | ICD-10-CM | POA: Diagnosis not present

## 2021-02-13 ENCOUNTER — Other Ambulatory Visit: Payer: Self-pay | Admitting: Interventional Cardiology

## 2021-02-15 ENCOUNTER — Other Ambulatory Visit (HOSPITAL_COMMUNITY): Payer: Self-pay

## 2021-02-15 MED ORDER — METOPROLOL TARTRATE 25 MG PO TABS
25.0000 mg | ORAL_TABLET | Freq: Two times a day (BID) | ORAL | 3 refills | Status: DC
  Filled 2021-02-15: qty 180, 90d supply, fill #0
  Filled 2021-05-07: qty 180, 90d supply, fill #1
  Filled 2021-08-11: qty 180, 90d supply, fill #2
  Filled 2021-11-14: qty 180, 90d supply, fill #3

## 2021-03-04 DIAGNOSIS — L508 Other urticaria: Secondary | ICD-10-CM | POA: Diagnosis not present

## 2021-03-04 DIAGNOSIS — Z7189 Other specified counseling: Secondary | ICD-10-CM | POA: Diagnosis not present

## 2021-03-04 DIAGNOSIS — Z800 Family history of malignant neoplasm of digestive organs: Secondary | ICD-10-CM | POA: Diagnosis not present

## 2021-03-04 DIAGNOSIS — K760 Fatty (change of) liver, not elsewhere classified: Secondary | ICD-10-CM | POA: Diagnosis not present

## 2021-03-04 DIAGNOSIS — K7689 Other specified diseases of liver: Secondary | ICD-10-CM | POA: Diagnosis not present

## 2021-03-24 DIAGNOSIS — Z6841 Body Mass Index (BMI) 40.0 and over, adult: Secondary | ICD-10-CM | POA: Diagnosis not present

## 2021-03-24 DIAGNOSIS — Z01419 Encounter for gynecological examination (general) (routine) without abnormal findings: Secondary | ICD-10-CM | POA: Diagnosis not present

## 2021-03-24 DIAGNOSIS — Z1231 Encounter for screening mammogram for malignant neoplasm of breast: Secondary | ICD-10-CM | POA: Diagnosis not present

## 2021-03-24 DIAGNOSIS — K6282 Dysplasia of anus: Secondary | ICD-10-CM | POA: Diagnosis not present

## 2021-03-24 DIAGNOSIS — N898 Other specified noninflammatory disorders of vagina: Secondary | ICD-10-CM | POA: Diagnosis not present

## 2021-03-25 ENCOUNTER — Encounter: Payer: Self-pay | Admitting: Internal Medicine

## 2021-03-25 ENCOUNTER — Ambulatory Visit (AMBULATORY_SURGERY_CENTER): Payer: 59 | Admitting: Internal Medicine

## 2021-03-25 ENCOUNTER — Other Ambulatory Visit: Payer: Self-pay

## 2021-03-25 VITALS — BP 107/53 | HR 79 | Temp 98.6°F | Resp 16 | Ht 59.0 in | Wt 204.0 lb

## 2021-03-25 DIAGNOSIS — K2950 Unspecified chronic gastritis without bleeding: Secondary | ICD-10-CM | POA: Diagnosis not present

## 2021-03-25 DIAGNOSIS — I10 Essential (primary) hypertension: Secondary | ICD-10-CM | POA: Diagnosis not present

## 2021-03-25 DIAGNOSIS — R768 Other specified abnormal immunological findings in serum: Secondary | ICD-10-CM | POA: Diagnosis not present

## 2021-03-25 DIAGNOSIS — R0789 Other chest pain: Secondary | ICD-10-CM

## 2021-03-25 DIAGNOSIS — K2990 Gastroduodenitis, unspecified, without bleeding: Secondary | ICD-10-CM | POA: Diagnosis not present

## 2021-03-25 DIAGNOSIS — K2970 Gastritis, unspecified, without bleeding: Secondary | ICD-10-CM | POA: Diagnosis not present

## 2021-03-25 DIAGNOSIS — B9681 Helicobacter pylori [H. pylori] as the cause of diseases classified elsewhere: Secondary | ICD-10-CM | POA: Diagnosis not present

## 2021-03-25 DIAGNOSIS — K219 Gastro-esophageal reflux disease without esophagitis: Secondary | ICD-10-CM | POA: Diagnosis not present

## 2021-03-25 DIAGNOSIS — I2510 Atherosclerotic heart disease of native coronary artery without angina pectoris: Secondary | ICD-10-CM | POA: Diagnosis not present

## 2021-03-25 MED ORDER — SODIUM CHLORIDE 0.9 % IV SOLN: 500.0000 mL | Freq: Once | INTRAVENOUS | Status: DC

## 2021-03-25 NOTE — Op Note (Signed)
[facility] Patient Name: [name] Procedure Date: 03/25/2021 9:39 AM MRN: [id number] Endoscopist: Jerene Bears , MD Age: 55 Referring MD:  Date of Birth: 27-Aug-1966 Gender: Female Account #: [id number] Procedure:                Upper GI endoscopy Indications:              Positive Helicobacter pylori serology, Chest pain                            (non cardiac) with question of GERD Medicines:                Monitored Anesthesia Care Procedure:                Pre-Anesthesia Assessment:                           - Prior to the procedure, a History and Physical                            was performed, and patient medications and                            allergies were reviewed. The patient's tolerance of                            previous anesthesia was also reviewed. The risks                            and benefits of the procedure and the sedation                            options and risks were discussed with the patient.                            All questions were answered, and informed consent                            was obtained. Prior Anticoagulants: The patient has                            taken no previous anticoagulant or antiplatelet                            agents. ASA Grade Assessment: III - A patient with                            severe systemic disease. After reviewing the risks                            and benefits, the patient was deemed in                            satisfactory condition to undergo the procedure.  After obtaining informed consent, the endoscope was                            passed under direct vision. Throughout the                            procedure, the patient's blood pressure, pulse, and                            oxygen saturations were monitored continuously. The                            Endoscope was introduced through the mouth, and                            advanced to the  second part of duodenum. The upper                            GI endoscopy was accomplished without difficulty.                            The patient tolerated the procedure well. Scope In: Scope Out: Findings:                 The lower third of the esophagus was mildly                            tortuous. Query element of dysmotility.                           Normal mucosa was found in the entire esophagus.                           The Z-line was regular and was found 35 cm from the                            incisors. The BRAVO capsule with delivery system                            was introduced through the mouth and advanced into                            the esophagus, such that the BRAVO pH capsule was                            positioned 29 cm from the incisors, which was 6 cm                            proximal to the GE junction. Suction was applied to                            the well of the BRAVO pH capsule to suck in the  adjacent mucosa of the esophagus using the external                            vacuum pump set at a minimum vacuum pressure of 550                            mmHg for 30 seconds. The BRAVO pH capsule was then                            deployed by depressing the plunger on top of the                            handle to advance the locking pin into the mucosa,                            thereby attaching the capsule to the esophagus. The                            plunger was then rotated a quarter turn clockwise                            to release the capsule from the delivery system.                            The delivery system was then withdrawn. Endoscopy                            was utilized for probe placement and diagnostic                            evaluation.                           Diffuse mild inflammation characterized by erythema                            was found in the gastric body and in the gastric                             antrum. Biopsies were taken with a cold forceps for                            histology and Helicobacter pylori testing.                           The cardia and gastric fundus were normal on                            retroflexion.                           The examined duodenum was normal. Complications:            No immediate  complications. Estimated Blood Loss:     Estimated blood loss was minimal. Impression:               - Mildly tortuous esophagus.                           - Normal mucosa was found in the entire esophagus.                           - Gastritis. Biopsied.                           - Normal examined duodenum.                           - The BRAVO pH capsule was positioned 29 cm from                            the incisors, which was 6 cm proximal to the GE                            junction. Recommendation:           - Patient has a contact number available for                            emergencies. The signs and symptoms of potential                            delayed complications were discussed with the                            patient. Return to normal activities tomorrow.                            Written discharge instructions were provided to the                            patient.                           - Resume previous diet.                           - Continue present medications.                           - Await pathology results.                           - Await interpretation of BRAVO pH results. If no                            evidence of esophageal reflux consider exclusion of                            hypercontractile esophagus. Jerene Bears, MD  03/25/2021 10:23:03 AM This report has been signed electronically.

## 2021-03-25 NOTE — Progress Notes (Signed)
Report to PACU, RN, vss, BBS= Clear.  

## 2021-03-25 NOTE — Patient Instructions (Signed)
Resume previous diet and medications. Awaiting pathology results and interpretation of BRAVO PH results.  Post-op Bravo pH instructions Once you get home:  Eat normally and go about your daily routine/activities Limit drinking fluids or eating between meals Do not chew gum or eat hard candy DO NOT take any antacid or anti-reflux medications during the 48-hour monitoring time, unless instructed by your physician  Recording events: Events to be recorded are:  Record using event buttons on recorder and write on paper diary form Every time you eat or drink something (other than water) 2.   Periods of lying down/reclining 3.  Symptoms:  may include heartburn, regurgitation, chest pain, cough or specify if other.  A paper diary is also provided to record the times of your reflux symptoms and times for meals and when you lie down.  The recorder needs to remain within 3 feet (arms length) of you during the testing period (48 hours). If you should forget and move outside of a 3-foot radius of the receiver you may hear beeping and you will see a "C1" error in the display window on the top of the receiver.  Please pick up the receiver and hold close to you to re-establish the connection and the error message disappears.  You may take a bath/shower during the testing period, but the recorder must not get wet and must remain within 3 feet of you. Please leave the receiver outside of the shower or tub while bathing. The monitoring period will be for 48 hours after placement of the capsule.  At the end of the 48 hours, you will return the recorder, and your diary, to our [facility] front desk.  A nurse will meet you to collect the device and answer any questions you may have.  The device should turn off once the 48 hours is complete.   What to expect after placement of the capsule:  Some patients experience a vague sensation that something is in their esophagus or that they 'feel' the  capsule when they swallow food.  Should you experience this, chewing food carefully or drinking liquids may minimize this sensation.   After the test is complete, the disposable capsule will fall off the wall of your esophagus within 5-10 days and pass naturally with your bowel movement through the digestive tract.  Once the recorder is returned, your provider will review and interpret your recordings and contact you to discuss your results.  This may take up to two weeks.   DO NOT have an MRI for 30 days after your procedure to ensure the capsule is no longer inside your body  It is imperative that you return the recorder on _________________________ by 3:00pm.  Your information must be downloaded at this time to obtain your results.

## 2021-03-25 NOTE — Progress Notes (Signed)
Capsule expiration date- 06/09/2024  Capsule ID number [id number]  LES measurement:35 (capsule placed 6 cm above LES) 29  Time of implant:1014

## 2021-03-29 ENCOUNTER — Telehealth: Payer: Self-pay

## 2021-03-29 NOTE — Telephone Encounter (Signed)
  Follow up Call-  Call back number 03/25/2021  Post procedure Call Back phone  # [phone]  Permission to leave phone message Yes  Some recent data might be hidden     Patient questions:  Do you have a fever, pain , or abdominal swelling? No. Pain Score  0 *  Have you tolerated food without any problems? Yes.    Have you been able to return to your normal activities? Yes.    Do you have any questions about your discharge instructions: Diet   No. Medications  No. Follow up visit  No.  Do you have questions or concerns about your Care? No.  Actions: * If pain score is 4 or above: No action needed, pain <4.   Have you developed a fever since your procedure? no  2.   Have you had an respiratory symptoms (SOB or cough) since your procedure? no  3.   Have you tested positive for COVID 19 since your procedure no  4.   Have you had any family members/close contacts diagnosed with the COVID 19 since your procedure?  no   If yes to any of these questions please route to Joylene John, RN and Joella Prince, RN

## 2021-03-31 ENCOUNTER — Other Ambulatory Visit: Payer: Self-pay

## 2021-03-31 MED ORDER — TALICIA 250-12.5-10 MG PO CPDR: 4.0000 | DELAYED_RELEASE_CAPSULE | Freq: Three times a day (TID) | ORAL | 0 refills | Status: DC

## 2021-04-01 DIAGNOSIS — L509 Urticaria, unspecified: Secondary | ICD-10-CM | POA: Diagnosis not present

## 2021-04-06 ENCOUNTER — Telehealth: Payer: Self-pay

## 2021-04-06 DIAGNOSIS — A630 Anogenital (venereal) warts: Secondary | ICD-10-CM | POA: Diagnosis not present

## 2021-04-06 NOTE — Telephone Encounter (Signed)
Pt tested positive for H pylori. Dr. Hilarie Fredrickson wanted pt started on Talicia. Pts copay was $242 and she cannot afford this. As DOD please advise what to prescribe for pt.

## 2021-04-06 NOTE — Telephone Encounter (Signed)
[name] is an ideal choice as the quadruple therapy regimen contains doxycyline and she is allergic to tetracycline.  Clarithromycin regimens are a second choice. Omeprazole 20 mg po bid x 14 days Clarithromycin 500 mg po bid x 14 days Amoxicillin 1 g po bid x 14 days Order H pylori stool antigen under Dr. Hilarie Fredrickson no sooner than 4 weeks after completing her treatment regimen

## 2021-04-07 ENCOUNTER — Other Ambulatory Visit (HOSPITAL_COMMUNITY): Payer: Self-pay

## 2021-04-07 ENCOUNTER — Other Ambulatory Visit: Payer: Self-pay

## 2021-04-07 MED ORDER — CLARITHROMYCIN 500 MG PO TABS
500.0000 mg | ORAL_TABLET | Freq: Two times a day (BID) | ORAL | 0 refills | Status: DC
  Filled 2021-04-07: qty 28, 14d supply, fill #0

## 2021-04-07 MED ORDER — AMOXICILLIN 500 MG PO CAPS
1000.0000 mg | ORAL_CAPSULE | Freq: Two times a day (BID) | ORAL | 0 refills | Status: DC
  Filled 2021-04-07: qty 56, 14d supply, fill #0

## 2021-04-07 MED ORDER — OMEPRAZOLE 20 MG PO CPDR
20.0000 mg | DELAYED_RELEASE_CAPSULE | Freq: Two times a day (BID) | ORAL | 0 refills | Status: DC
  Filled 2021-04-07: qty 28, 14d supply, fill #0

## 2021-04-07 NOTE — Telephone Encounter (Signed)
Spoke with pt and she is aware, scripts sent to pharmacy. Reminder in epic for lab.

## 2021-04-13 ENCOUNTER — Telehealth: Payer: Self-pay | Admitting: Internal Medicine

## 2021-04-13 NOTE — Telephone Encounter (Signed)
Patient has been advised that BRAVO pH does NOT show any significant reflux which means her atypical chest pain should not be related to reflux. She is advised that should her continues continue to be bothersome, she should contact us. At that time, we may be able to put her on medication for non-acid related esophageal hypersensitivity. Patient verbalizes understanding of this information. She also states that she just started her H Pylori treatment a few days ago but will discontinue omeprazole following the H Pylori treatment course.

## 2021-04-13 NOTE — Telephone Encounter (Signed)
Please contact the patient with the results of her BRAVO pH study: This was done for atypical chest pain  Bravo pH study shows no evidence of significant GERD or acid reflux disease. Thus her atypical chest pain does not relate to acid reflux If the symptoms continue to be bothersome she should let me know as there are certain medications which can help for esophageal hypersensitivity (non acid related).  She was diagnosed and treated for H. pylori recently with antibiotics.  Once she completes H. pylori therapy she can stop the twice daily PPI

## 2021-04-21 DIAGNOSIS — A630 Anogenital (venereal) warts: Secondary | ICD-10-CM | POA: Diagnosis not present

## 2021-04-28 DIAGNOSIS — A630 Anogenital (venereal) warts: Secondary | ICD-10-CM | POA: Diagnosis not present

## 2021-05-07 IMAGING — DX PORTABLE CHEST - 1 VIEW
1 series · 1 of 1 positions shown · non-contrast
Comparison: 04/04/2019.

CLINICAL DATA: CABG.

EXAM:
PORTABLE CHEST 1 VIEW

[chest ap]
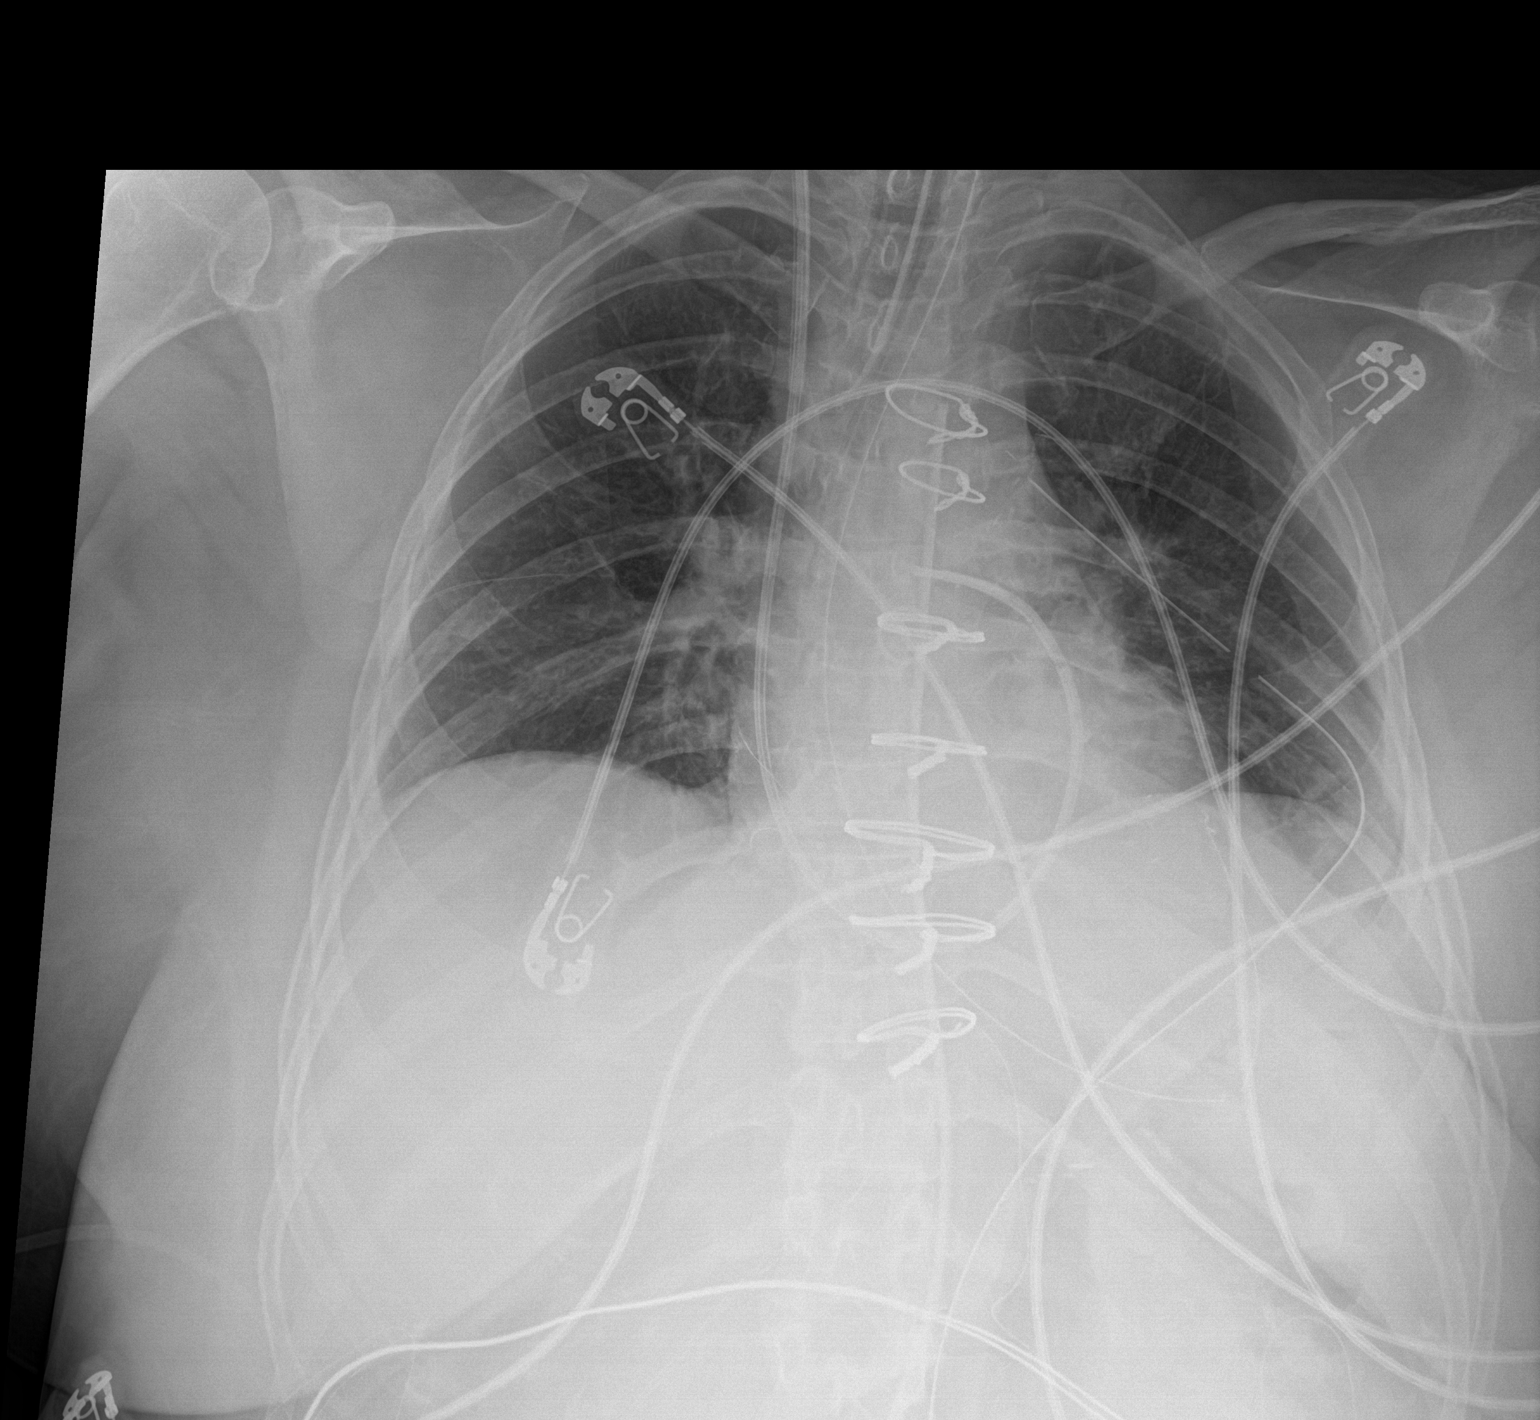

[1 of 1 positions shown; findings below may reference images not displayed]

FINDINGS: Endotracheal tube, NG tube, Swan-Ganz catheter, mediastinal drainage
catheter, left chest tube in good anatomic position. No
pneumothorax. Prior CABG. Heart size normal. Low lung volumes. Tiny
left pleural effusion cannot be excluded.
IMPRESSION: 1. Lines and tubes including left chest tube in good anatomic
position. No pneumothorax.

2.  Tiny left pleural effusion cannot be excluded.

## 2021-05-07 MED FILL — Atorvastatin Calcium Tab 80 MG (Base Equivalent): ORAL | 90 days supply | Qty: 90 | Fill #0 | Status: AC

## 2021-05-10 ENCOUNTER — Other Ambulatory Visit (HOSPITAL_COMMUNITY): Payer: Self-pay

## 2021-05-12 DIAGNOSIS — A630 Anogenital (venereal) warts: Secondary | ICD-10-CM | POA: Diagnosis not present

## 2021-05-21 IMAGING — US ULTRASOUND CHEST
1 series · 3 of 3 positions shown · non-contrast
Comparison: Chest CT 03/23/2019

CLINICAL DATA: 52-year-old with left pleural effusion on recent CT.

EXAM:
CHEST ULTRASOUND

[Series 1: ir (id) (id)/(id)/(id) ir · 3 of 3 slices shown]
[im 1/3]
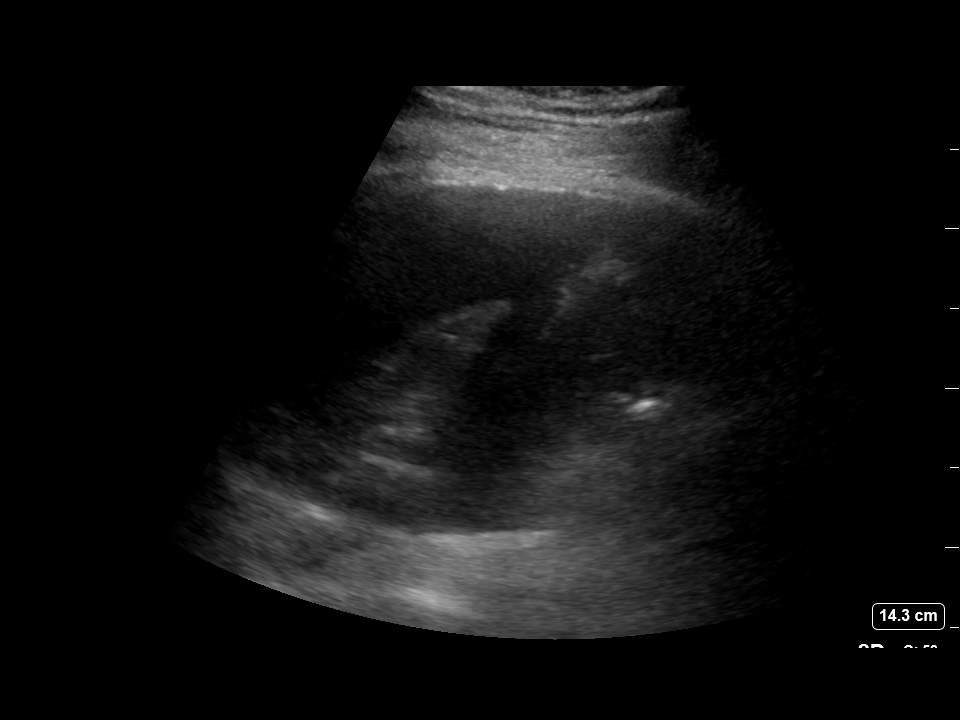
[im 2/3]
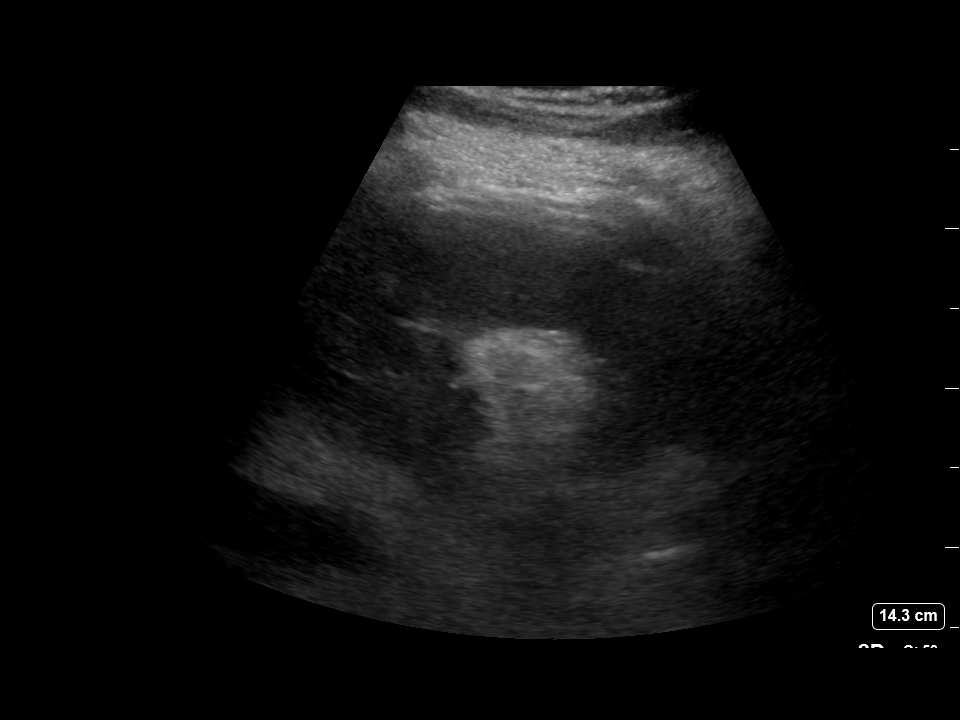
[im 3/3]
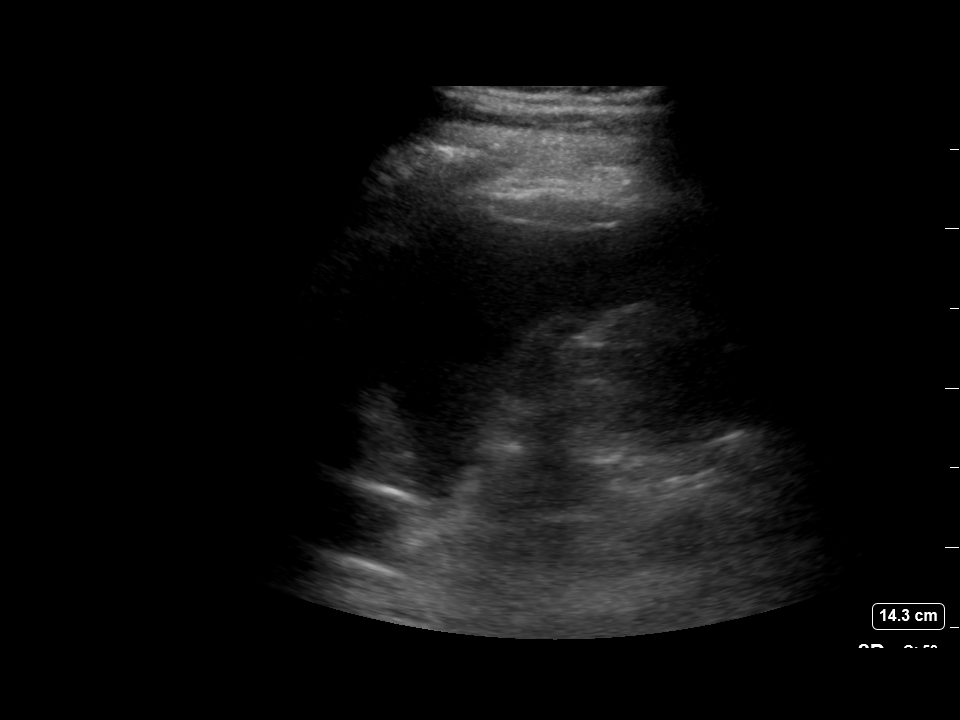

[3 of 3 positions shown; findings below may reference images not displayed]

FINDINGS: Small amount of fluid along the posterior left lung base. Small
percutaneous window and concern for flap of lung superficial to the
fluid.
IMPRESSION: Small left pleural effusion. Concern for a small amount of lung
between the ribcage and the pleural fluid. Therefore,
ultrasound-guided thoracentesis was not performed.

## 2021-05-26 DIAGNOSIS — A630 Anogenital (venereal) warts: Secondary | ICD-10-CM | POA: Diagnosis not present

## 2021-05-31 ENCOUNTER — Telehealth: Payer: Self-pay | Admitting: *Deleted

## 2021-05-31 NOTE — Telephone Encounter (Signed)
We have received a faxed office note from Dr Arvella Nigh 04/28/21 indicating the following:  HPI: RECHECK RECTAL CONDYLOMA 55 year old female presents for recheck. 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 We have been treating it with trichloroacetic acid, she states it did not really help. Her husband denies any outside activity with other people. So this could be some dysplastic changes as opposed to true HPV.  Discussion notes: Present time will see her back in a week to recheck. If they do persist, we may treat them with cryotherapy. Ultimately I may refer her back to her gastroenterologist in view of the possibility of low-grade dysplastic changes to make sure there is no other rectal or colonic issues. Will go over that on her return to office. 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 I have contacted Dr Sherran Needs referrals coordinator, Santiago Glad and have given her this detailed information and asked that they get patient in with [facility]. She verbalizes understanding of this. In addition, I have spoken to the patient (who originally scheduled an appointment with Alonza Bogus, PA-C to discuss this) to advise that she likely needs to see a colorectal surgeon regarding the condyloma as we do not typically follow this. I also advised that I have spoken to Dr Ophelia Charter office so she should make sure to have them follow up about [facility] referral. Her appointment with Alonza Bogus, PA-C has been cancelled for now as she had no additional concerns to be addressed in GI.

## 2021-06-03 ENCOUNTER — Other Ambulatory Visit: Payer: Self-pay

## 2021-06-03 DIAGNOSIS — R768 Other specified abnormal immunological findings in serum: Secondary | ICD-10-CM

## 2021-06-04 IMAGING — DX CHEST - 2 VIEW
2 series · 2 of 2 positions shown · non-contrast
Comparison: CT chest 04/22/2019.  PA and lateral chest 04/21/2019.

CLINICAL DATA: Patient status post CABG 04/08/2019.

EXAM:
CHEST - 2 VIEW

[dg chest 2 view (1 of 2)]
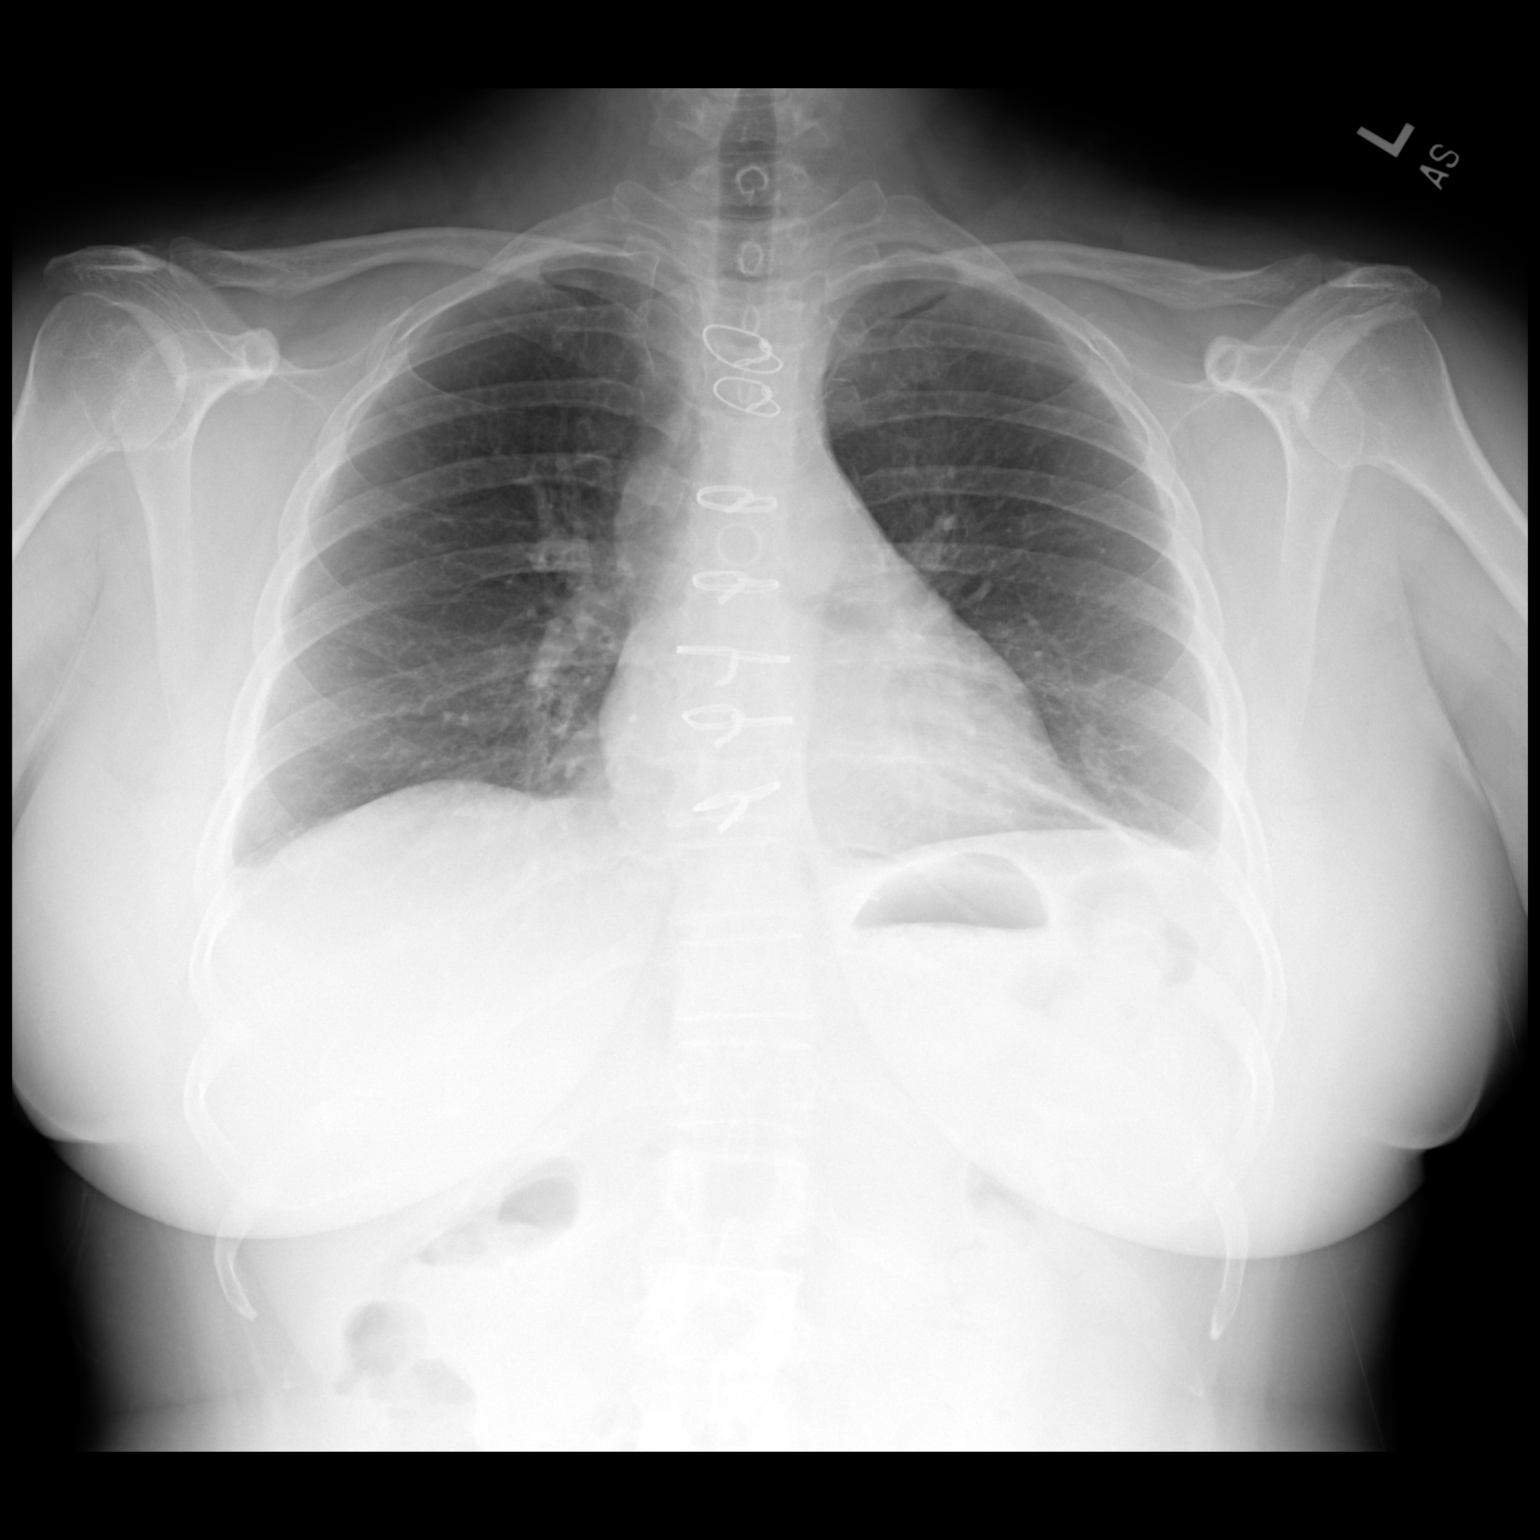

[dg chest 2 view (2 of 2)]
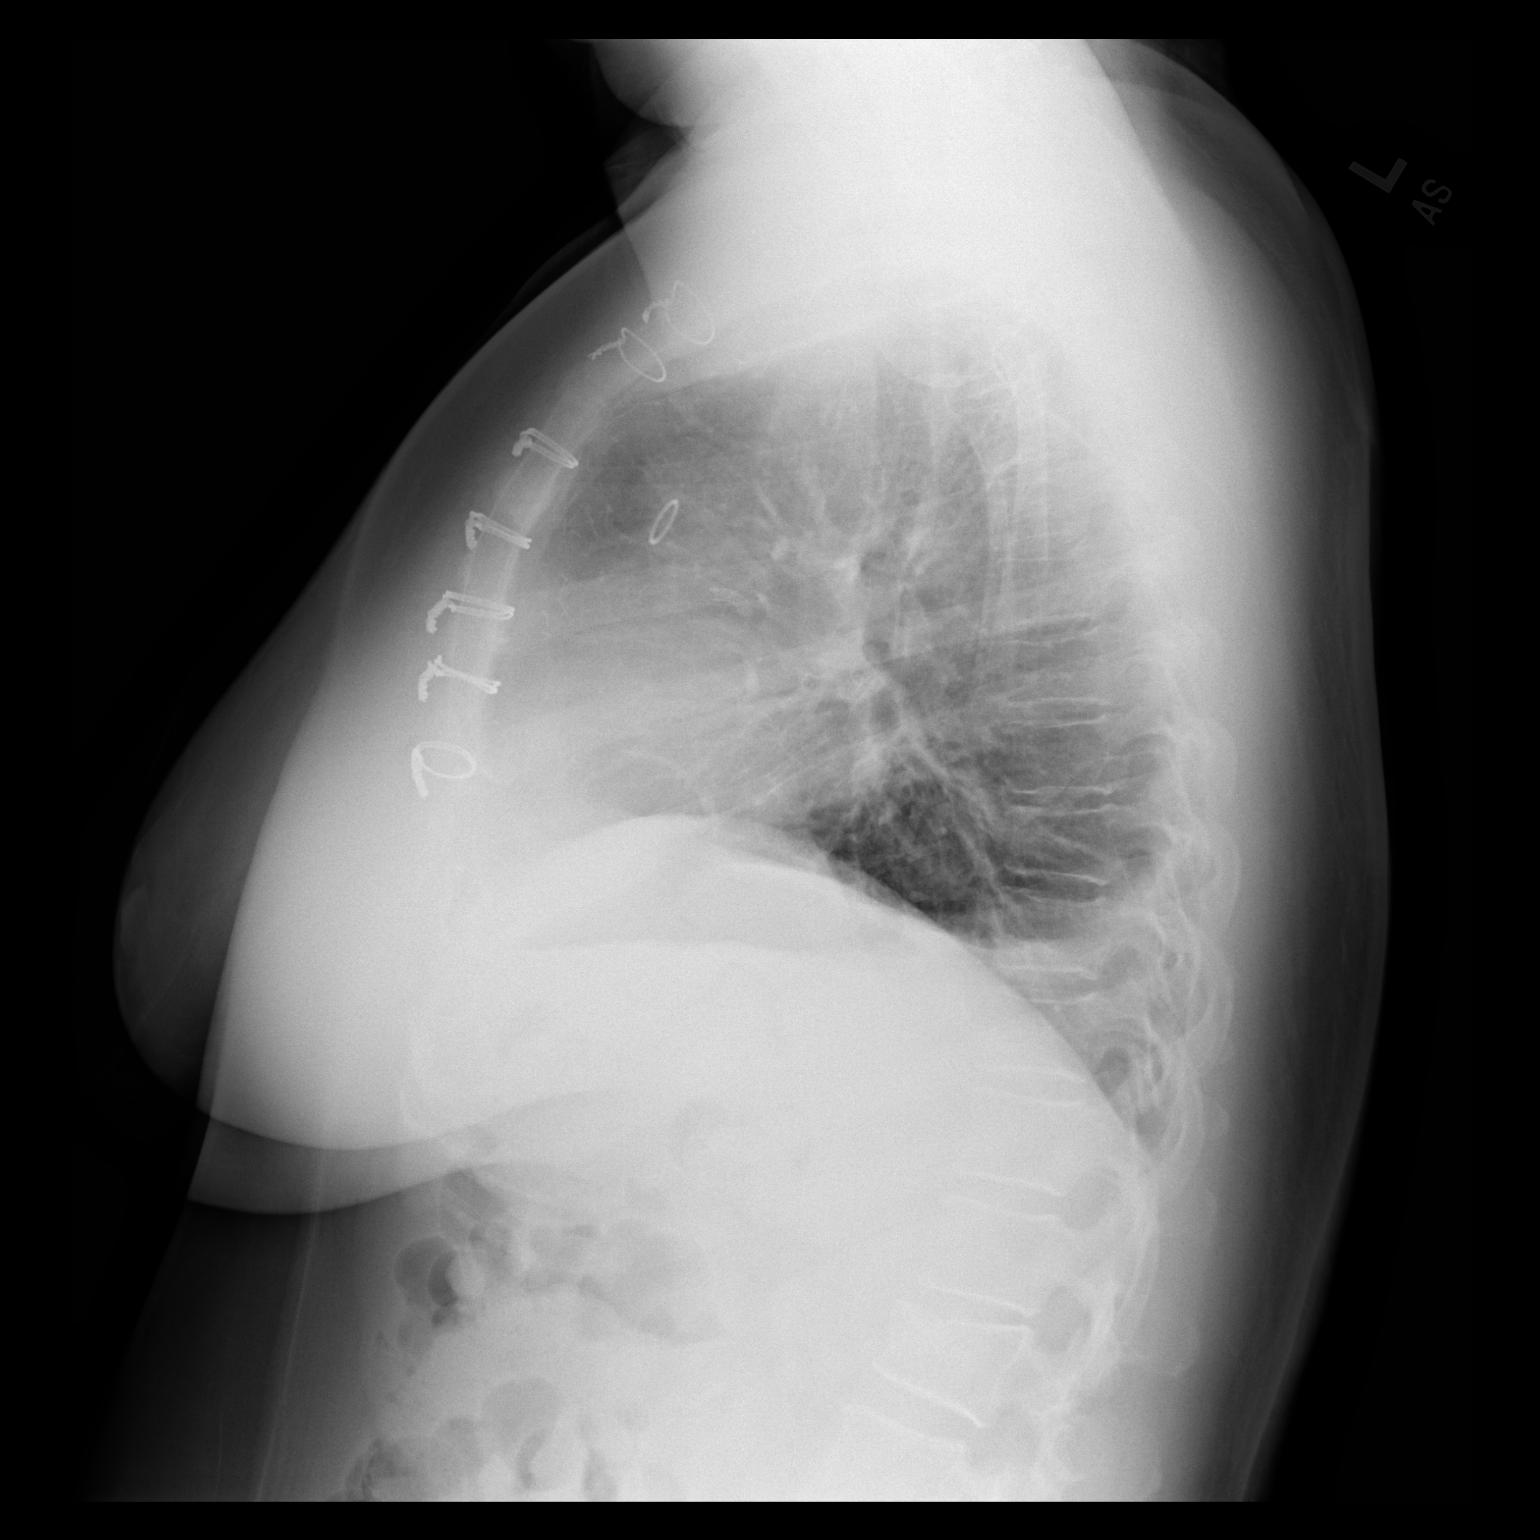

[2 of 2 positions shown; findings below may reference images not displayed]

FINDINGS: Median sternotomy wires are intact and unchanged. Small left pleural
effusion and mild left basilar atelectasis have improved since the
most recent plain film of the chest. Lungs are otherwise clear. No
pulmonary edema. No pneumothorax. Heart size is normal. No acute or
focal bony abnormality.
IMPRESSION: No acute disease.

Very small left pleural effusion and basilar atelectasis are
improved since the most recent exam.

## 2021-06-09 ENCOUNTER — Ambulatory Visit: Payer: 59 | Admitting: Gastroenterology

## 2021-06-22 ENCOUNTER — Other Ambulatory Visit: Payer: 59

## 2021-06-24 ENCOUNTER — Other Ambulatory Visit: Payer: Self-pay | Admitting: *Deleted

## 2021-06-24 ENCOUNTER — Other Ambulatory Visit: Payer: 59

## 2021-06-24 DIAGNOSIS — R768 Other specified abnormal immunological findings in serum: Secondary | ICD-10-CM

## 2021-06-25 LAB — HELICOBACTER PYLORI  SPECIAL ANTIGEN
MICRO NUMBER:: 12347593
SPECIMEN QUALITY: ADEQUATE

## 2021-07-30 DIAGNOSIS — E785 Hyperlipidemia, unspecified: Secondary | ICD-10-CM | POA: Diagnosis not present

## 2021-08-03 DIAGNOSIS — N951 Menopausal and female climacteric states: Secondary | ICD-10-CM | POA: Diagnosis not present

## 2021-08-03 DIAGNOSIS — K6282 Dysplasia of anus: Secondary | ICD-10-CM | POA: Diagnosis not present

## 2021-08-03 DIAGNOSIS — K760 Fatty (change of) liver, not elsewhere classified: Secondary | ICD-10-CM | POA: Diagnosis not present

## 2021-08-03 DIAGNOSIS — Z0000 Encounter for general adult medical examination without abnormal findings: Secondary | ICD-10-CM | POA: Diagnosis not present

## 2021-08-03 DIAGNOSIS — I25728 Atherosclerosis of autologous artery coronary artery bypass graft(s) with other forms of angina pectoris: Secondary | ICD-10-CM | POA: Diagnosis not present

## 2021-08-03 DIAGNOSIS — K7689 Other specified diseases of liver: Secondary | ICD-10-CM | POA: Diagnosis not present

## 2021-08-03 DIAGNOSIS — L508 Other urticaria: Secondary | ICD-10-CM | POA: Diagnosis not present

## 2021-08-03 DIAGNOSIS — Z1331 Encounter for screening for depression: Secondary | ICD-10-CM | POA: Diagnosis not present

## 2021-08-03 DIAGNOSIS — Z1389 Encounter for screening for other disorder: Secondary | ICD-10-CM | POA: Diagnosis not present

## 2021-08-11 ENCOUNTER — Other Ambulatory Visit (HOSPITAL_COMMUNITY): Payer: Self-pay

## 2021-08-11 MED FILL — Atorvastatin Calcium Tab 80 MG (Base Equivalent): ORAL | 90 days supply | Qty: 90 | Fill #1 | Status: AC

## 2021-08-30 ENCOUNTER — Other Ambulatory Visit (HOSPITAL_COMMUNITY): Payer: Self-pay

## 2021-08-30 DIAGNOSIS — K6282 Dysplasia of anus: Secondary | ICD-10-CM | POA: Diagnosis not present

## 2021-08-30 MED ORDER — IMIQUIMOD 5 % EX CREA
TOPICAL_CREAM | CUTANEOUS | 0 refills | Status: DC
  Filled 2021-08-30: qty 24, 56d supply, fill #0

## 2021-11-14 MED FILL — Atorvastatin Calcium Tab 80 MG (Base Equivalent): ORAL | 90 days supply | Qty: 90 | Fill #2 | Status: AC

## 2021-11-15 ENCOUNTER — Other Ambulatory Visit (HOSPITAL_COMMUNITY): Payer: Self-pay

## 2021-11-15 MED ORDER — LAGEVRIO 200 MG PO CAPS
4.0000 | ORAL_CAPSULE | Freq: Two times a day (BID) | ORAL | 0 refills | Status: AC
  Filled 2021-11-15: qty 40, 5d supply, fill #0

## 2021-12-05 ENCOUNTER — Telehealth: Payer: 59 | Admitting: Nurse Practitioner

## 2021-12-05 DIAGNOSIS — R21 Rash and other nonspecific skin eruption: Secondary | ICD-10-CM | POA: Diagnosis not present

## 2021-12-05 MED ORDER — TRIAMCINOLONE ACETONIDE 0.1 % EX CREA: 1.0000 "application " | TOPICAL_CREAM | Freq: Two times a day (BID) | CUTANEOUS | 0 refills | Status: AC

## 2021-12-05 MED ORDER — PREDNISONE 20 MG PO TABS: 20.0000 mg | ORAL_TABLET | Freq: Every day | ORAL | 0 refills | Status: AC

## 2021-12-05 NOTE — Patient Instructions (Addendum)
[name], thank you for joining [name], NP for today's virtual visit.  While this provider is not your primary care provider (PCP), if your PCP is located in our provider database this encounter information will be shared with them immediately following your visit.  Consent: (Patient) [name] provided verbal consent for this virtual visit at the beginning of the encounter.  Current Medications:  Current Outpatient Medications:    predniSONE (DELTASONE) 20 MG tablet, Take 1 tablet (20 mg total) by mouth daily with breakfast for 5 days., Disp: 5 tablet, Rfl: 0   triamcinolone cream (KENALOG) 0.1 %, Apply 1 application topically 2 (two) times daily for 10 days. Use sparingly to the affected areas twice daily., Disp: 30 g, Rfl: 0   albuterol (VENTOLIN HFA) 108 (90 Base) MCG/ACT inhaler, INHALE 2 PUFFS INTO THE LUNGS EVERY 4 HOURS AS NEEDED. (Patient not taking: Reported on 03/25/2021), Disp: 8.5 g, Rfl: 1   Amoxicill-Rifabutin-Omeprazole (TALICIA) 937-16.9-67 MG CPDR, Take 4 capsules by mouth 3 (three) times daily., Disp: 168 capsule, Rfl: 0   amoxicillin (AMOXIL) 500 MG capsule, Take 2 capsules (1,000 mg total) by mouth 2 (two) times daily., Disp: 56 capsule, Rfl: 0   aspirin 81 MG chewable tablet, Chew 1 tablet (81 mg total) by mouth daily., Disp: 30 tablet, Rfl: 0   atorvastatin (LIPITOR) 80 MG tablet, TAKE 1 TABLET (80 MG TOTAL) BY MOUTH DAILY AT 6 PM., Disp: 90 tablet, Rfl: 3   cetirizine (ZYRTEC ALLERGY) 10 MG tablet, 1 tablet, Disp: , Rfl:    clarithromycin (BIAXIN) 500 MG tablet, Take 1 tablet (500 mg total) by mouth 2 (two) times daily., Disp: 28 tablet, Rfl: 0   Famotidine (PEPCID AC PO), Take 20 mg by mouth as needed., Disp: , Rfl:    imiquimod (ALDARA) 5 % cream, Apply 1 packet topically 3 (three) times a week, Disp: 24 each, Rfl: 0   loratadine (CLARITIN) 10 MG tablet, Take 10 mg by mouth daily as needed for allergies., Disp: , Rfl:    metoprolol tartrate  (LOPRESSOR) 25 MG tablet, Take 1 tablet (25 mg total) by mouth 2 (two) times daily., Disp: 180 tablet, Rfl: 3   nitroGLYCERIN (NITROSTAT) 0.4 MG SL tablet, PLACE 1 TABLET UNDER THE TONGUE EVERY FIVE MINUTES AS NEEDED FOR CHEST PAIN. (Patient not taking: Reported on 03/25/2021), Disp: 25 tablet, Rfl: 5   omeprazole (PRILOSEC) 20 MG capsule, Take 1 capsule (20 mg total) by mouth 2 (two) times daily before a meal., Disp: 28 capsule, Rfl: 0   triamcinolone (KENALOG) 0.1 %, APPLY SPOT TREATMENT TWICE DAILY FOR 7-14 DAYS TO SEE IF IMPROVES (Patient not taking: Reported on 03/25/2021), Disp: 60 g, Rfl: 0  Current Facility-Administered Medications:    0.9 %  sodium chloride infusion, 500 mL, Intravenous, Once, Pyrtle, Lajuan Lines, MD   Medications ordered in this encounter:  Meds ordered this encounter  Medications   predniSONE (DELTASONE) 20 MG tablet    Sig: Take 1 tablet (20 mg total) by mouth daily with breakfast for 5 days.    Dispense:  5 tablet    Refill:  0   triamcinolone cream (KENALOG) 0.1 %    Sig: Apply 1 application topically 2 (two) times daily for 10 days. Use sparingly to the affected areas twice daily.    Dispense:  30 g    Refill:  0     *If you need refills on other medications prior to your next appointment, please contact your  pharmacy*  Follow-Up: Call back or seek an in-person evaluation if the symptoms worsen or if the condition fails to improve as anticipated.  Other Instructions Will prescribe Prednisone 20mg  daily for 5 days to help with inflammation and swelling. Will also prescribe triamcinolone cream to apply sparingly to the affected areas. Mix triamcinolone cream with aquaphor with application. Also use cool compresses to help with irritation and swelling. Patient advised that steroid cream can lighten the skin, advised for short-term use only, avoid direct contact with the eyes. Recommend follow-up with PCP or dermatologist if symptoms do not improve.   If you have been  instructed to have an in-person evaluation today at a local Urgent Care facility, please use the link below. It will take you to a list of all of our available [facility], including address, phone number and hours of operation. Please do not delay care.  [facility]  If you or a family member do not have a primary care provider, use the link below to schedule a visit and establish care. When you choose a [facility] primary care physician or advanced practice provider, you gain a long-term partner in health. Find a Primary Care Provider  Learn more about [facility]'s in-office and virtual care options: [facility]

## 2021-12-05 NOTE — Progress Notes (Signed)
Virtual Visit Consent   [name], you are scheduled for a virtual visit with a [facility] provider today.     Just as with appointments in the office, your consent must be obtained to participate.  Your consent will be active for this visit and any virtual visit you may have with one of our providers in the next 365 days.     If you have a MyChart account, a copy of this consent can be sent to you electronically.  All virtual visits are billed to your insurance company just like a traditional visit in the office.    As this is a virtual visit, video technology does not allow for your provider to perform a traditional examination.  This may limit your provider's ability to fully assess your condition.  If your provider identifies any concerns that need to be evaluated in person or the need to arrange testing (such as labs, EKG, etc.), we will make arrangements to do so.     Although advances in technology are sophisticated, we cannot ensure that it will always work on either your end or our end.  If the connection with a video visit is poor, the visit may have to be switched to a telephone visit.  With either a video or telephone visit, we are not always able to ensure that we have a secure connection.     I need to obtain your verbal consent now.   Are you willing to proceed with your visit today?    [name] has provided verbal consent on 12/05/2021 for a virtual visit (video or telephone).   Taylor Men, NP   Date: 12/05/2021 3:29 PM   Virtual Visit via Video Note   I, [name], connected with  [name]  ([id number], 1966/01/07) on 12/05/21 at  3:30 PM EST by a video-enabled telemedicine application and verified that I am speaking with the correct person using two identifiers.  Location: Patient: Virtual Visit Location Patient: Home Provider: Virtual Visit Location Provider: Home   I discussed the limitations of evaluation and management by  telemedicine and the availability of in person appointments. The patient expressed understanding and agreed to proceed.    History of Present Illness: [name] is a 56 y.o. who identifies as a female who was assigned female at birth, and is being seen today for irritation of the skin around her eyes for 1 month  HPI: Rash The current episode started 1 to 4 weeks ago. The problem has been gradually worsening since onset. The affected locations include the left eye and right eye. The rash is characterized by burning, dryness, redness, scaling and itchiness. It is unknown (denies contacts or glasses) if there was an exposure to a precipitant. Pertinent negatives include no congestion, cough, fever or sore throat. Past treatments include cold compress (aquaphor). The treatment provided mild relief. Her past medical history is significant for allergies. There is no history of eczema.    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   Morbid obesity ([place]) 12/18/2018   Ischemic cardiomyopathy -in setting of anterior non-STEMI 12/17/2018   Acute combined systolic and diastolic heart failure ([place]) 12/17/2018   Acute non-ST elevation myocardial infarction (NSTEMI) ([place]) 12/16/2018   CAD S/P DES PCI -pLAD 12/16/2018   Hyperlipidemia  with target LDL less than 70 12/16/2018   Traumatic fracture of spine at T12-L1 level, sequela 03/27/2014   Encounter for IUD removal 05/16/2013    Class: Status post   S/P tubal ligation 05/16/2013    Class: Status post   IRREGULAR MENSES 09/29/2009    Allergies:  Allergies  Allergen Reactions   Tramadol     Made her feel very strange and groggy   Tetracycline Rash   Medications:  Current Outpatient Medications:    albuterol (VENTOLIN HFA) 108 (90 Base) MCG/ACT inhaler, INHALE 2 PUFFS INTO THE LUNGS EVERY  4 HOURS AS NEEDED. (Patient not taking: Reported on 03/25/2021), Disp: 8.5 g, Rfl: 1   Amoxicill-Rifabutin-Omeprazole (TALICIA) 767-20.9-47 MG CPDR, Take 4 capsules by mouth 3 (three) times daily., Disp: 168 capsule, Rfl: 0   amoxicillin (AMOXIL) 500 MG capsule, Take 2 capsules (1,000 mg total) by mouth 2 (two) times daily., Disp: 56 capsule, Rfl: 0   aspirin 81 MG chewable tablet, Chew 1 tablet (81 mg total) by mouth daily., Disp: 30 tablet, Rfl: 0   atorvastatin (LIPITOR) 80 MG tablet, TAKE 1 TABLET (80 MG TOTAL) BY MOUTH DAILY AT 6 PM., Disp: 90 tablet, Rfl: 3   cetirizine (ZYRTEC ALLERGY) 10 MG tablet, 1 tablet, Disp: , Rfl:    clarithromycin (BIAXIN) 500 MG tablet, Take 1 tablet (500 mg total) by mouth 2 (two) times daily., Disp: 28 tablet, Rfl: 0   Famotidine (PEPCID AC PO), Take 20 mg by mouth as needed., Disp: , Rfl:    imiquimod (ALDARA) 5 % cream, Apply 1 packet topically 3 (three) times a week, Disp: 24 each, Rfl: 0   loratadine (CLARITIN) 10 MG tablet, Take 10 mg by mouth daily as needed for allergies., Disp: , Rfl:    metoprolol tartrate (LOPRESSOR) 25 MG tablet, Take 1 tablet (25 mg total) by mouth 2 (two) times daily., Disp: 180 tablet, Rfl: 3   nitroGLYCERIN (NITROSTAT) 0.4 MG SL tablet, PLACE 1 TABLET UNDER THE TONGUE EVERY FIVE MINUTES AS NEEDED FOR CHEST PAIN. (Patient not taking: Reported on 03/25/2021), Disp: 25 tablet, Rfl: 5   omeprazole (PRILOSEC) 20 MG capsule, Take 1 capsule (20 mg total) by mouth 2 (two) times daily before a meal., Disp: 28 capsule, Rfl: 0   triamcinolone (KENALOG) 0.1 %, APPLY SPOT TREATMENT TWICE DAILY FOR 7-14 DAYS TO SEE IF IMPROVES (Patient not taking: Reported on 03/25/2021), Disp: 60 g, Rfl: 0  Current Facility-Administered Medications:    0.9 %  sodium chloride infusion, 500 mL, Intravenous, Once, Pyrtle, Lajuan Lines, MD  Observations/Objective: Patient is well-developed, well-nourished in no acute distress.  Resting comfortably at home.  Head is  normocephalic, atraumatic.  No labored breathing.  Speech is clear and coherent with logical content.  Patient is alert and oriented at baseline.    Assessment and Plan: 1. Rash and nonspecific skin eruption - predniSONE (DELTASONE) 20 MG tablet; Take 1 tablet (20 mg total) by mouth daily with breakfast for 5 days.  Dispense: 5 tablet; Refill: 0 - triamcinolone cream (KENALOG) 0.1 %; Apply 1 application topically 2 (two) times daily for 10 days. Use sparingly to the affected areas twice daily.  Dispense: 30 g; Refill: 0  Nonspecific skin irritation to the bilateral eyelids and surrounding skin. Cannot determine trigger per patient and symptoms were difficulty to visualize using virtual visit. Will prescribe Prednisone 20mg  daily for 5 days to help with inflammation and swelling. Will also prescribe triamcinolone cream to apply sparingly to the affected areas.  Mix triamcinolone cream with aquaphor with application. Also use cool compresses to help with irritation and swelling. Patient advised that steroid cream can lighten the skin, advised for short-term use only, avoid direct contact with the eyes. Recommend follow-up with PCP or dermatologist if symptoms do not improve. Patient verbalizes understanding, all questions answered.    Follow Up Instructions: I discussed the assessment and treatment plan with the patient. The patient was provided an opportunity to ask questions and all were answered. The patient agreed with the plan and demonstrated an understanding of the instructions.  A copy of instructions were sent to the patient via MyChart unless otherwise noted below.     The patient was advised to call back or seek an in-person evaluation if the symptoms worsen or if the condition fails to improve as anticipated.  Time:  I spent 10 minutes with the patient via telehealth technology discussing the  above problems/concerns.    Taylor Men, NP

## 2021-12-29 DIAGNOSIS — R12 Heartburn: Secondary | ICD-10-CM | POA: Diagnosis not present

## 2021-12-29 DIAGNOSIS — K760 Fatty (change of) liver, not elsewhere classified: Secondary | ICD-10-CM | POA: Diagnosis not present

## 2021-12-29 DIAGNOSIS — L508 Other urticaria: Secondary | ICD-10-CM | POA: Diagnosis not present

## 2021-12-29 DIAGNOSIS — K7689 Other specified diseases of liver: Secondary | ICD-10-CM | POA: Diagnosis not present

## 2021-12-29 DIAGNOSIS — Z800 Family history of malignant neoplasm of digestive organs: Secondary | ICD-10-CM | POA: Diagnosis not present

## 2021-12-29 DIAGNOSIS — L309 Dermatitis, unspecified: Secondary | ICD-10-CM | POA: Diagnosis not present

## 2021-12-29 DIAGNOSIS — K6282 Dysplasia of anus: Secondary | ICD-10-CM | POA: Diagnosis not present

## 2021-12-29 DIAGNOSIS — I25728 Atherosclerosis of autologous artery coronary artery bypass graft(s) with other forms of angina pectoris: Secondary | ICD-10-CM | POA: Diagnosis not present

## 2021-12-29 DIAGNOSIS — I5032 Chronic diastolic (congestive) heart failure: Secondary | ICD-10-CM | POA: Diagnosis not present

## 2022-02-10 ENCOUNTER — Other Ambulatory Visit: Payer: Self-pay | Admitting: Interventional Cardiology

## 2022-02-10 ENCOUNTER — Other Ambulatory Visit (HOSPITAL_COMMUNITY): Payer: Self-pay

## 2022-02-10 MED ORDER — METOPROLOL TARTRATE 25 MG PO TABS
25.0000 mg | ORAL_TABLET | Freq: Two times a day (BID) | ORAL | 0 refills | Status: DC
  Filled 2022-02-10: qty 180, 90d supply, fill #0

## 2022-02-11 ENCOUNTER — Other Ambulatory Visit (HOSPITAL_COMMUNITY): Payer: Self-pay

## 2022-02-11 ENCOUNTER — Other Ambulatory Visit: Payer: Self-pay | Admitting: Interventional Cardiology

## 2022-02-11 MED ORDER — ATORVASTATIN CALCIUM 80 MG PO TABS
80.0000 mg | ORAL_TABLET | Freq: Every day | ORAL | 1 refills | Status: DC
  Filled 2022-02-11: qty 30, 30d supply, fill #0
  Filled 2022-04-03: qty 30, 30d supply, fill #1

## 2022-03-01 ENCOUNTER — Encounter: Payer: Self-pay | Admitting: Internal Medicine

## 2022-04-04 ENCOUNTER — Other Ambulatory Visit (HOSPITAL_COMMUNITY): Payer: Self-pay

## 2022-04-12 ENCOUNTER — Ambulatory Visit: Payer: 59 | Admitting: Interventional Cardiology

## 2022-04-12 ENCOUNTER — Encounter: Payer: Self-pay | Admitting: Interventional Cardiology

## 2022-04-12 VITALS — BP 118/78 | HR 59 | Ht 59.0 in | Wt 207.4 lb

## 2022-04-12 DIAGNOSIS — R0609 Other forms of dyspnea: Secondary | ICD-10-CM

## 2022-04-12 DIAGNOSIS — I10 Essential (primary) hypertension: Secondary | ICD-10-CM

## 2022-04-12 DIAGNOSIS — Z951 Presence of aortocoronary bypass graft: Secondary | ICD-10-CM

## 2022-04-12 DIAGNOSIS — E785 Hyperlipidemia, unspecified: Secondary | ICD-10-CM

## 2022-04-13 LAB — PRO B NATRIURETIC PEPTIDE: NT-Pro BNP: 108 pg/mL (ref 0–249)

## 2022-05-03 DIAGNOSIS — Z1151 Encounter for screening for human papillomavirus (HPV): Secondary | ICD-10-CM | POA: Diagnosis not present

## 2022-05-03 DIAGNOSIS — Z124 Encounter for screening for malignant neoplasm of cervix: Secondary | ICD-10-CM | POA: Diagnosis not present

## 2022-05-03 DIAGNOSIS — Z01419 Encounter for gynecological examination (general) (routine) without abnormal findings: Secondary | ICD-10-CM | POA: Diagnosis not present

## 2022-05-03 DIAGNOSIS — Z13820 Encounter for screening for osteoporosis: Secondary | ICD-10-CM | POA: Diagnosis not present

## 2022-05-03 DIAGNOSIS — Z1231 Encounter for screening mammogram for malignant neoplasm of breast: Secondary | ICD-10-CM | POA: Diagnosis not present

## 2022-05-03 DIAGNOSIS — Z6841 Body Mass Index (BMI) 40.0 and over, adult: Secondary | ICD-10-CM | POA: Diagnosis not present

## 2022-05-04 ENCOUNTER — Encounter: Payer: Self-pay | Admitting: Internal Medicine

## 2022-05-04 ENCOUNTER — Ambulatory Visit (HOSPITAL_COMMUNITY): Payer: 59 | Attending: Internal Medicine

## 2022-05-04 DIAGNOSIS — R0609 Other forms of dyspnea: Secondary | ICD-10-CM | POA: Diagnosis not present

## 2022-05-04 LAB — ECHOCARDIOGRAM COMPLETE
Area-P 1/2: 3.99 cm2
S' Lateral: 2.9 cm

## 2022-05-04 MED ORDER — PERFLUTREN LIPID MICROSPHERE
1.0000 mL | INTRAVENOUS | Status: AC | PRN
  Administered 2022-05-04: 1 mL via INTRAVENOUS

## 2022-05-05 ENCOUNTER — Other Ambulatory Visit: Payer: Self-pay | Admitting: Interventional Cardiology

## 2022-05-06 ENCOUNTER — Other Ambulatory Visit (HOSPITAL_COMMUNITY): Payer: Self-pay

## 2022-05-06 MED ORDER — METOPROLOL TARTRATE 25 MG PO TABS
25.0000 mg | ORAL_TABLET | Freq: Two times a day (BID) | ORAL | 3 refills | Status: DC
  Filled 2022-05-06: qty 180, 90d supply, fill #0
  Filled 2022-08-10 – 2022-08-11 (×3): qty 180, 90d supply, fill #1
  Filled 2022-11-08: qty 180, 90d supply, fill #2
  Filled 2023-02-06: qty 180, 90d supply, fill #3

## 2022-05-06 MED ORDER — ATORVASTATIN CALCIUM 80 MG PO TABS
80.0000 mg | ORAL_TABLET | Freq: Every day | ORAL | 3 refills | Status: DC
  Filled 2022-05-06: qty 90, 90d supply, fill #0
  Filled 2022-07-26: qty 90, 90d supply, fill #1
  Filled 2022-11-08: qty 90, 90d supply, fill #2
  Filled 2023-02-06: qty 90, 90d supply, fill #3

## 2022-05-10 ENCOUNTER — Telehealth: Payer: Self-pay | Admitting: *Deleted

## 2022-05-10 NOTE — Telephone Encounter (Signed)
Noted  

## 2022-05-10 NOTE — Telephone Encounter (Signed)
Sheila,  This pt is cleared for anesthetic care at [facility].  Thanks,  Elbie Statzer 

## 2022-05-10 NOTE — Telephone Encounter (Signed)
Please review chart and advise if can be done in [place] ?

## 2022-05-16 ENCOUNTER — Other Ambulatory Visit: Payer: Self-pay

## 2022-05-16 ENCOUNTER — Ambulatory Visit (AMBULATORY_SURGERY_CENTER): Payer: Self-pay

## 2022-05-16 ENCOUNTER — Other Ambulatory Visit (HOSPITAL_COMMUNITY): Payer: Self-pay

## 2022-05-16 ENCOUNTER — Telehealth: Payer: Self-pay | Admitting: Internal Medicine

## 2022-05-16 VITALS — Ht 59.0 in | Wt 207.0 lb

## 2022-05-16 DIAGNOSIS — Z800 Family history of malignant neoplasm of digestive organs: Secondary | ICD-10-CM

## 2022-05-16 MED ORDER — NA SULFATE-K SULFATE-MG SULF 17.5-3.13-1.6 GM/177ML PO SOLN
1.0000 | Freq: Once | ORAL | 0 refills | Status: DC
  Filled 2022-05-16: qty 354, 1d supply, fill #0

## 2022-05-16 MED ORDER — NA SULFATE-K SULFATE-MG SULF 17.5-3.13-1.6 GM/177ML PO SOLN
1.0000 | Freq: Once | ORAL | 0 refills | Status: AC
  Filled 2022-05-16: qty 354, 1d supply, fill #0

## 2022-05-16 NOTE — Telephone Encounter (Signed)
Sent in generic suprep

## 2022-05-16 NOTE — Telephone Encounter (Signed)
Inbound call from patients pharmacy wanting to know if she can have the generic brand of Suprep. Please advise.

## 2022-05-16 NOTE — Progress Notes (Signed)
Denies allergies to eggs or soy products. Denies complication of anesthesia or sedation. Denies use of weight loss medication. Denies use of O2.   Emmi instructions given for colonoscopy.  

## 2022-05-17 ENCOUNTER — Other Ambulatory Visit (HOSPITAL_COMMUNITY): Payer: Self-pay

## 2022-05-18 ENCOUNTER — Other Ambulatory Visit (HOSPITAL_COMMUNITY): Payer: Self-pay

## 2022-05-18 DIAGNOSIS — J0100 Acute maxillary sinusitis, unspecified: Secondary | ICD-10-CM | POA: Diagnosis not present

## 2022-05-18 DIAGNOSIS — Z1152 Encounter for screening for COVID-19: Secondary | ICD-10-CM | POA: Diagnosis not present

## 2022-05-18 DIAGNOSIS — J029 Acute pharyngitis, unspecified: Secondary | ICD-10-CM | POA: Diagnosis not present

## 2022-05-18 MED ORDER — NOREL AD 4-10-325 MG PO TABS
1.0000 | ORAL_TABLET | Freq: Four times a day (QID) | ORAL | 0 refills | Status: DC | PRN
  Filled 2022-05-18: qty 30, 10d supply, fill #0

## 2022-05-18 MED ORDER — AMOXICILLIN-POT CLAVULANATE 875-125 MG PO TABS
1.0000 | ORAL_TABLET | Freq: Two times a day (BID) | ORAL | 0 refills | Status: DC
  Filled 2022-05-18: qty 20, 10d supply, fill #0

## 2022-05-30 ENCOUNTER — Encounter: Payer: Self-pay | Admitting: Internal Medicine

## 2022-06-06 ENCOUNTER — Ambulatory Visit (AMBULATORY_SURGERY_CENTER): Payer: 59 | Admitting: Internal Medicine

## 2022-06-06 ENCOUNTER — Encounter: Payer: Self-pay | Admitting: Internal Medicine

## 2022-06-06 VITALS — BP 116/72 | HR 74 | Temp 96.9°F | Resp 13 | Ht 59.0 in | Wt 207.0 lb

## 2022-06-06 DIAGNOSIS — Z09 Encounter for follow-up examination after completed treatment for conditions other than malignant neoplasm: Secondary | ICD-10-CM | POA: Diagnosis not present

## 2022-06-06 DIAGNOSIS — I10 Essential (primary) hypertension: Secondary | ICD-10-CM | POA: Diagnosis not present

## 2022-06-06 DIAGNOSIS — Z800 Family history of malignant neoplasm of digestive organs: Secondary | ICD-10-CM | POA: Diagnosis not present

## 2022-06-06 DIAGNOSIS — Z86010 Personal history of colonic polyps: Secondary | ICD-10-CM | POA: Diagnosis not present

## 2022-06-06 DIAGNOSIS — Z1211 Encounter for screening for malignant neoplasm of colon: Secondary | ICD-10-CM | POA: Diagnosis not present

## 2022-06-06 DIAGNOSIS — I2510 Atherosclerotic heart disease of native coronary artery without angina pectoris: Secondary | ICD-10-CM | POA: Diagnosis not present

## 2022-06-06 MED ORDER — SODIUM CHLORIDE 0.9 % IV SOLN: 500.0000 mL | INTRAVENOUS | Status: DC

## 2022-06-06 NOTE — Op Note (Signed)
[facility] Patient Name: [name] Procedure Date: 06/06/2022 11:14 AM MRN: [id number] Endoscopist: Jerene Bears , MD Age: 56 Referring MD:  Date of Birth: 01-Apr-1966 Gender: Female Account #: [id number] Procedure:                Colonoscopy Indications:              Screening in patient at increased risk: Family                            history of 1st-degree relative with colorectal                            cancer, Last colonoscopy: March 2018 Medicines:                Monitored Anesthesia Care Procedure:                Pre-Anesthesia Assessment:                           - Prior to the procedure, a History and Physical                            was performed, and patient medications and                            allergies were reviewed. The patient's tolerance of                            previous anesthesia was also reviewed. The risks                            and benefits of the procedure and the sedation                            options and risks were discussed with the patient.                            All questions were answered, and informed consent                            was obtained. Prior Anticoagulants: The patient has                            taken no previous anticoagulant or antiplatelet                            agents. ASA Grade Assessment: III - A patient with                            severe systemic disease. After reviewing the risks                            and benefits, the patient was deemed in  satisfactory condition to undergo the procedure.                           After obtaining informed consent, the colonoscope                            was passed under direct vision. Throughout the                            procedure, the patient's blood pressure, pulse, and                            oxygen saturations were monitored continuously. The                            [id number]-HQ190L Colonoscope was  introduced through the                            anus and advanced to the cecum, identified by                            palpation. The colonoscopy was performed without                            difficulty. The patient tolerated the procedure                            well. The quality of the bowel preparation was good                            (fair needing irrigation and lavage in the right                            colon). The ileocecal valve, appendiceal orifice,                            and rectum were photographed. Scope In: 11:18:00 AM Scope Out: 11:30:30 AM Scope Withdrawal Time: 0 hours 9 minutes 40 seconds  Total Procedure Duration: 0 hours 12 minutes 30 seconds  Findings:                 The digital rectal exam was normal.                           The entire examined colon appeared normal on direct                            and retroflexion views. Complications:            No immediate complications. Estimated Blood Loss:     Estimated blood loss: none. Impression:               - The entire examined colon is normal on direct and  retroflexion views.                           - No specimens collected. Recommendation:           - Patient has a contact number available for                            emergencies. The signs and symptoms of potential                            delayed complications were discussed with the                            patient. Return to normal activities tomorrow.                            Written discharge instructions were provided to the                            patient.                           - Resume previous diet.                           - Continue present medications.                           - Repeat colonoscopy in 5 years for screening                            purposes with 2 day prep and anti-emetic. Jerene Bears, MD 06/06/2022 11:32:45 AM This report has been signed electronically.

## 2022-06-06 NOTE — Progress Notes (Signed)
Report to PACU, RN, vss, BBS= Clear.  

## 2022-06-06 NOTE — Progress Notes (Signed)
GASTROENTEROLOGY PROCEDURE H&P NOTE   Primary Care Physician: Sueanne Margarita, DO    Reason for Procedure:   Fam hx of colon cancer  Plan:    colonoscopy  Patient is appropriate for endoscopic procedure(s) in the ambulatory ([place]) setting.  The nature of the procedure, as well as the risks, benefits, and alternatives were carefully and thoroughly reviewed with the patient. Ample time for discussion and questions allowed. The patient understood, was satisfied, and agreed to proceed.     HPI: [name] is a 56 y.o. female who presents for screening colonoscopy.  Medical history as below.  Tolerated the prep.  No recent chest pain or shortness of breath.  No abdominal pain today.  Past Medical History:  Diagnosis Date   Acute non-ST elevation myocardial infarction (NSTEMI) ([place]) 12/16/2018   Allergy    Atherosclerosis    Blood transfusion without reported diagnosis    CAD S/P DES PCI -pLAD 12/16/2018   Cath 12/15/2017: Successful proximal LAD synergy 3.0 x 15 DES postdilated to 3.25 mm reducing the stenosis to 0% ; ? 60-70% ostial LAD noted on some images.   Elevated alkaline phosphatase level    GERD (gastroesophageal reflux disease)    Hepatic steatosis    Hyperlipidemia    Hyperplastic colon polyp    Hypertension    Pericarditis as complication of acute myocardial infarction (HCC)    PONV (postoperative nausea and vomiting)     Past Surgical History:  Procedure Laterality Date   CARDIAC CATHETERIZATION     CESAREAN SECTION     CORONARY ARTERY BYPASS GRAFT N/A 04/08/2019   Procedure: CORONARY ARTERY BYPASS GRAFTING (CABG) x two , using left internal mammary artery and right leg greater saphenous vein harvested endoscopically;  Surgeon: Ivin Poot, MD;  Location: [facility];  Service: Open Heart Surgery;  Laterality: N/A;   CORONARY/GRAFT ACUTE MI REVASCULARIZATION N/A 12/16/2018   Procedure: Coronary/Graft Acute MI Revascularization;  Surgeon: Belva Crome, MD;   Location: [facility];  Service: Cardiovascular;  Laterality: N/A;   IUD REMOVAL N/A 05/16/2013   Procedure: INTRAUTERINE DEVICE (IUD) REMOVAL;  Surgeon: Darlyn Chamber, MD;  Location: [facility];  Service: Gynecology;  Laterality: N/A;   LAPAROSCOPIC TUBAL LIGATION Bilateral 05/16/2013   Procedure: LAPAROSCOPIC TUBAL LIGATION;  Surgeon: Darlyn Chamber, MD;  Location: [facility];  Service: Gynecology;  Laterality: Bilateral;   LEFT HEART CATH AND CORONARY ANGIOGRAPHY N/A 12/16/2018   Procedure: LEFT HEART CATH AND CORONARY ANGIOGRAPHY;  Surgeon: Belva Crome, MD;  Location: [facility];  Service: Cardiovascular;  Laterality: N/A;   LEFT HEART CATH AND CORONARY ANGIOGRAPHY N/A 03/26/2019   Procedure: LEFT HEART CATH AND CORONARY ANGIOGRAPHY;  Surgeon: Belva Crome, MD;  Location: [facility];  Service: Cardiovascular;  Laterality: N/A;   TEE WITHOUT CARDIOVERSION N/A 04/08/2019   Procedure: TRANSESOPHAGEAL ECHOCARDIOGRAM (TEE);  Surgeon: Prescott Gum, Collier Salina, MD;  Location: [facility];  Service: Open Heart Surgery;  Laterality: N/A;   TUBAL LIGATION      Prior to Admission medications   Medication Sig Start Date End Date Taking? Authorizing Provider  aspirin 81 MG chewable tablet Chew 1 tablet (81 mg total) by mouth daily. 12/19/18  Yes Belva Crome, MD  atorvastatin (LIPITOR) 80 MG tablet Take 1 tablet (80 mg total) by mouth daily at 6 PM. 05/06/22  Yes Belva Crome, MD  Famotidine (PEPCID AC PO) Take 20 mg by mouth as needed.  Yes [provider]  levocetirizine (XYZAL) 5 MG tablet Take 1 tablet by mouth daily. 12/29/21  Yes [provider]  metoprolol tartrate (LOPRESSOR) 25 MG tablet Take 1 tablet (25 mg total) by mouth 2 (two) times daily. 05/06/22  Yes Belva Crome, MD  albuterol (VENTOLIN HFA) 108 (90 Base) MCG/ACT inhaler INHALE 2 PUFFS INTO THE LUNGS EVERY 4 HOURS AS NEEDED. Patient not taking: Reported on 03/25/2021 07/22/20 07/22/21  Sueanne Margarita, DO  loratadine  (CLARITIN) 10 MG tablet Take 10 mg by mouth daily as needed for allergies. Patient not taking: Reported on 05/16/2022    [provider]  nitroGLYCERIN (NITROSTAT) 0.4 MG SL tablet PLACE 1 TABLET UNDER THE TONGUE EVERY FIVE MINUTES AS NEEDED FOR CHEST PAIN. 07/19/19   Belva Crome, MD  triamcinolone cream (KENALOG) 0.1 % as needed.    [provider]    Current Outpatient Medications  Medication Sig Dispense Refill   aspirin 81 MG chewable tablet Chew 1 tablet (81 mg total) by mouth daily. 30 tablet 0   atorvastatin (LIPITOR) 80 MG tablet Take 1 tablet (80 mg total) by mouth daily at 6 PM. 90 tablet 3   Famotidine (PEPCID AC PO) Take 20 mg by mouth as needed.     levocetirizine (XYZAL) 5 MG tablet Take 1 tablet by mouth daily.     metoprolol tartrate (LOPRESSOR) 25 MG tablet Take 1 tablet (25 mg total) by mouth 2 (two) times daily. 180 tablet 3   albuterol (VENTOLIN HFA) 108 (90 Base) MCG/ACT inhaler INHALE 2 PUFFS INTO THE LUNGS EVERY 4 HOURS AS NEEDED. (Patient not taking: Reported on 03/25/2021) 8.5 g 1   loratadine (CLARITIN) 10 MG tablet Take 10 mg by mouth daily as needed for allergies. (Patient not taking: Reported on 05/16/2022)     nitroGLYCERIN (NITROSTAT) 0.4 MG SL tablet PLACE 1 TABLET UNDER THE TONGUE EVERY FIVE MINUTES AS NEEDED FOR CHEST PAIN. 25 tablet 5   triamcinolone cream (KENALOG) 0.1 % as needed.     Current Facility-Administered Medications  Medication Dose Route Frequency Provider Last Rate Last Admin   0.9 %  sodium chloride infusion  500 mL Intravenous Once Esai Stecklein, Lajuan Lines, MD       0.9 %  sodium chloride infusion  500 mL Intravenous Continuous Lichelle Viets, Lajuan Lines, MD        Allergies as of 06/06/2022 - Review Complete 06/06/2022  Allergen Reaction Noted   Tramadol  04/15/2019   Tetracycline Rash     Family History  Problem Relation Age of Onset   High blood pressure Mother    Heart attack Mother        2 stents   CAD Mother    Hyperlipidemia  Mother    Colon cancer Father 33   Hyperlipidemia Sister    Heart disease Brother    Heart failure Maternal Grandmother    Heart attack Maternal Grandfather        deceased 48   Cancer Paternal Grandfather    Heart disease Other    Heart attack Other    Esophageal cancer Neg Hx    Stomach cancer Neg Hx    Rectal cancer Neg Hx     Social History   Socioeconomic History   Marital status: Married    Spouse name: Not on file   Number of children: Not on file   Years of education: Not on file   Highest education level: Bachelor's degree (e.g., BA, AB, BS)  Occupational History   Occupation: BSN Nursing  Tobacco Use   Smoking status: Never   Smokeless tobacco: Never  Vaping Use   Vaping Use: Never used  Substance and Sexual Activity   Alcohol use: Yes    Comment:  once a month   Drug use: No   Sexual activity: Yes  Other Topics Concern   Not on file  Social History Narrative   Not on file   Social Determinants of Health   Financial Resource Strain: Low Risk  (06/04/2019)   Overall Financial Resource Strain (CARDIA)    Difficulty of Paying Living Expenses: Not hard at all  Food Insecurity: No Food Insecurity (06/04/2019)   Hunger Vital Sign    Worried About Running Out of Food in the Last Year: Never true    Ran Out of Food in the Last Year: Never true  Transportation Needs: No Transportation Needs (06/04/2019)   PRAPARE - Hydrologist (Medical): No    Lack of Transportation (Non-Medical): No  Physical Activity: Insufficiently Active (06/04/2019)   Exercise Vital Sign    Days of Exercise per Week: 4 days    Minutes of Exercise per Session: 30 min  Stress: Stress Concern Present (06/04/2019)   [place]    Feeling of Stress : To some extent  Social Connections: Not on file  Intimate Partner Violence: Not on file    Physical Exam: Vital signs in last 24 hours: '@BP'$  (!)  144/92   Pulse 96   Temp (!) 96.9 F (36.1 C)   Ht '4\' 11"'$  (1.499 m)   Wt 207 lb (93.9 kg)   LMP 01/20/2021 (Approximate)   SpO2 96%   BMI 41.81 kg/m  GEN: NAD EYE: Sclerae anicteric ENT: MMM CV: Non-tachycardic Pulm: CTA b/l GI: Soft, NT/ND NEURO:  Alert & Oriented x 3   Zenovia Jarred, MD [facility]  06/06/2022 11:12 AM

## 2022-06-06 NOTE — Patient Instructions (Signed)
YOU HAD AN ENDOSCOPIC PROCEDURE TODAY AT THE [facility]:   Refer to the procedure report that was given to you for any specific questions about what was found during the examination.  If the procedure report does not answer your questions, please call your gastroenterologist to clarify.  If you requested that your care partner not be given the details of your procedure findings, then the procedure report has been included in a sealed envelope for you to review at your convenience later.  YOU SHOULD EXPECT: Some feelings of bloating in the abdomen. Passage of more gas than usual.  Walking can help get rid of the air that was put into your GI tract during the procedure and reduce the bloating. If you had a lower endoscopy (such as a colonoscopy or flexible sigmoidoscopy) you may notice spotting of blood in your stool or on the toilet paper. If you underwent a bowel prep for your procedure, you may not have a normal bowel movement for a few days.  Please Note:  You might notice some irritation and congestion in your nose or some drainage.  This is from the oxygen used during your procedure.  There is no need for concern and it should clear up in a day or so.  SYMPTOMS TO REPORT IMMEDIATELY:  Following lower endoscopy (colonoscopy or flexible sigmoidoscopy):  Excessive amounts of blood in the stool  Significant tenderness or worsening of abdominal pains  Swelling of the abdomen that is new, acute  Fever of 100F or higher  For urgent or emergent issues, a gastroenterologist can be reached at any hour by calling [phone]. Do not use MyChart messaging for urgent concerns.    DIET:  We do recommend a small meal at first, but then you may proceed to your regular diet.  Drink plenty of fluids but you should avoid alcoholic beverages for 24 hours.  ACTIVITY:  You should plan to take it easy for the rest of today and you should NOT DRIVE or use heavy machinery until tomorrow (because of  the sedation medicines used during the test).    FOLLOW UP: Our staff will call the number listed on your records the next business day following your procedure.  We will call around 7:15- 8:00 am to check on you and address any questions or concerns that you may have regarding the information given to you following your procedure. If we do not reach you, we will leave a message.  If you develop any symptoms (ie: fever, flu-like symptoms, shortness of breath, cough etc.) before then, please call [phone].  If you test positive for Covid 19 in the 2 weeks post procedure, please call and report this information to us.    If any biopsies were taken you will be contacted by phone or by letter within the next 1-3 weeks.  Please call us at [phone] if you have not heard about the biopsies in 3 weeks.    SIGNATURES/CONFIDENTIALITY: You and/or your care partner have signed paperwork which will be entered into your electronic medical record.  These signatures attest to the fact that that the information above on your After Visit Summary has been reviewed and is understood.  Full responsibility of the confidentiality of this discharge information lies with you and/or your care-partner.  

## 2022-06-06 NOTE — Progress Notes (Signed)
Pt's states no medical or surgical changes since previsit or office visit. 

## 2022-06-07 ENCOUNTER — Telehealth: Payer: Self-pay

## 2022-06-07 NOTE — Telephone Encounter (Signed)
Left message

## 2022-06-22 IMAGING — CR DG CHEST 2V
3 series · 3 of 3 positions shown · non-contrast
Comparison: May 06, 2019

CLINICAL DATA: Shortness of breath

EXAM:
CHEST - 2 VIEW

[chest lat]
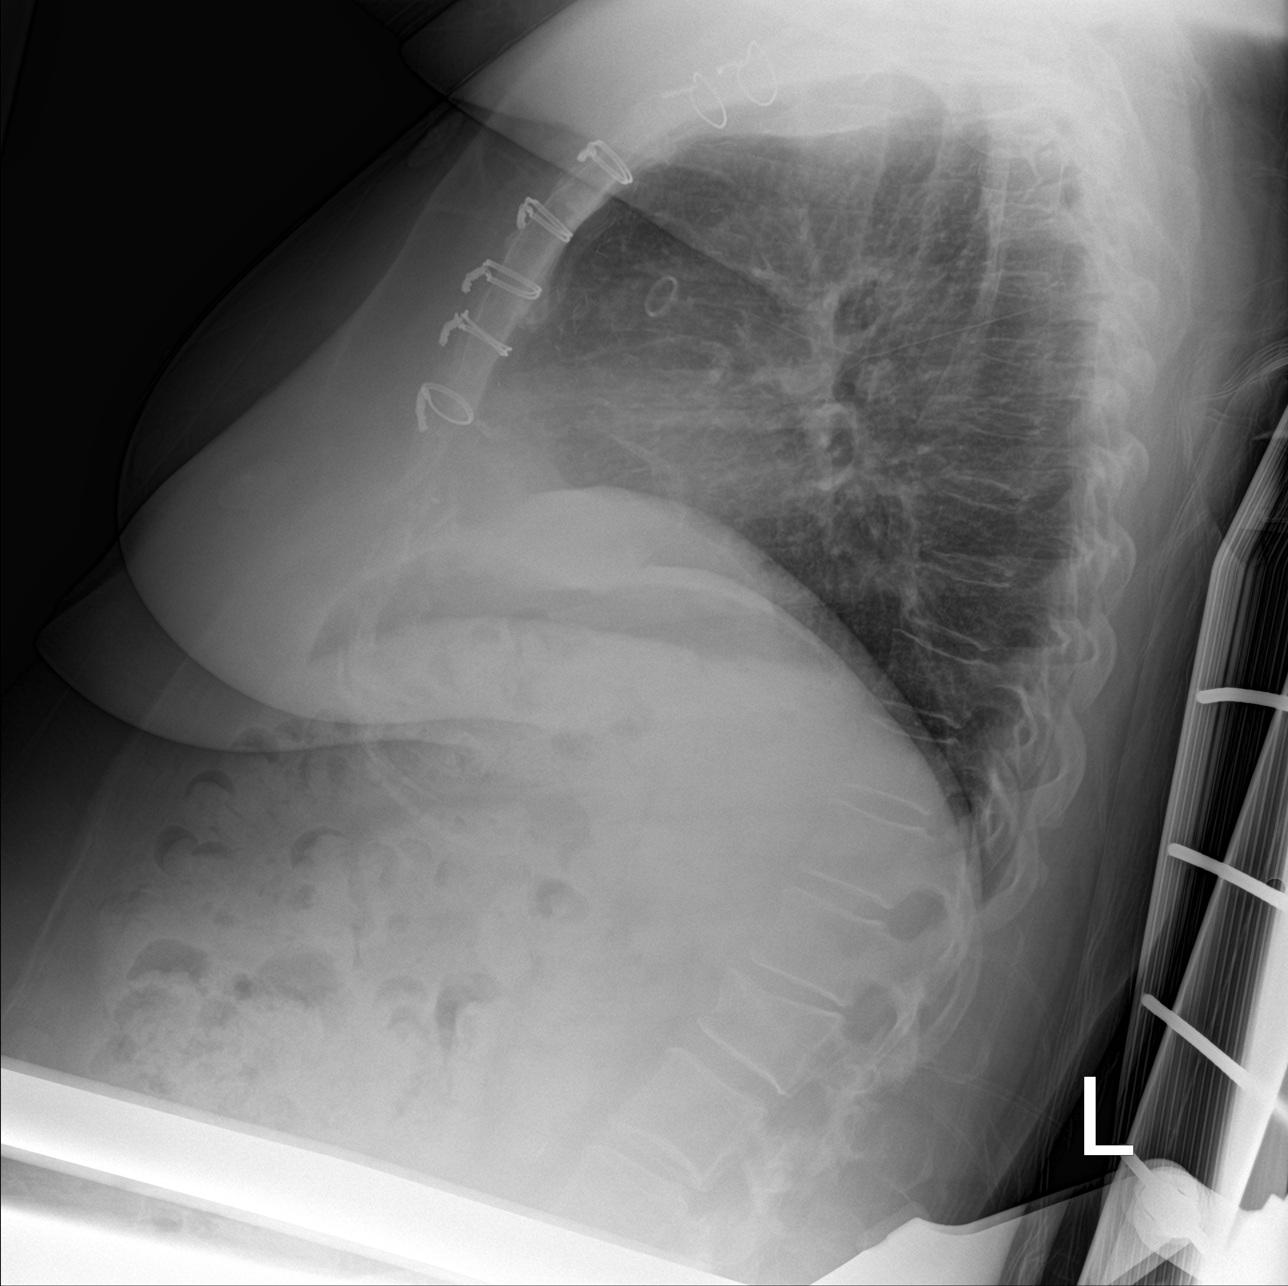

[chest ap (1 of 2)]
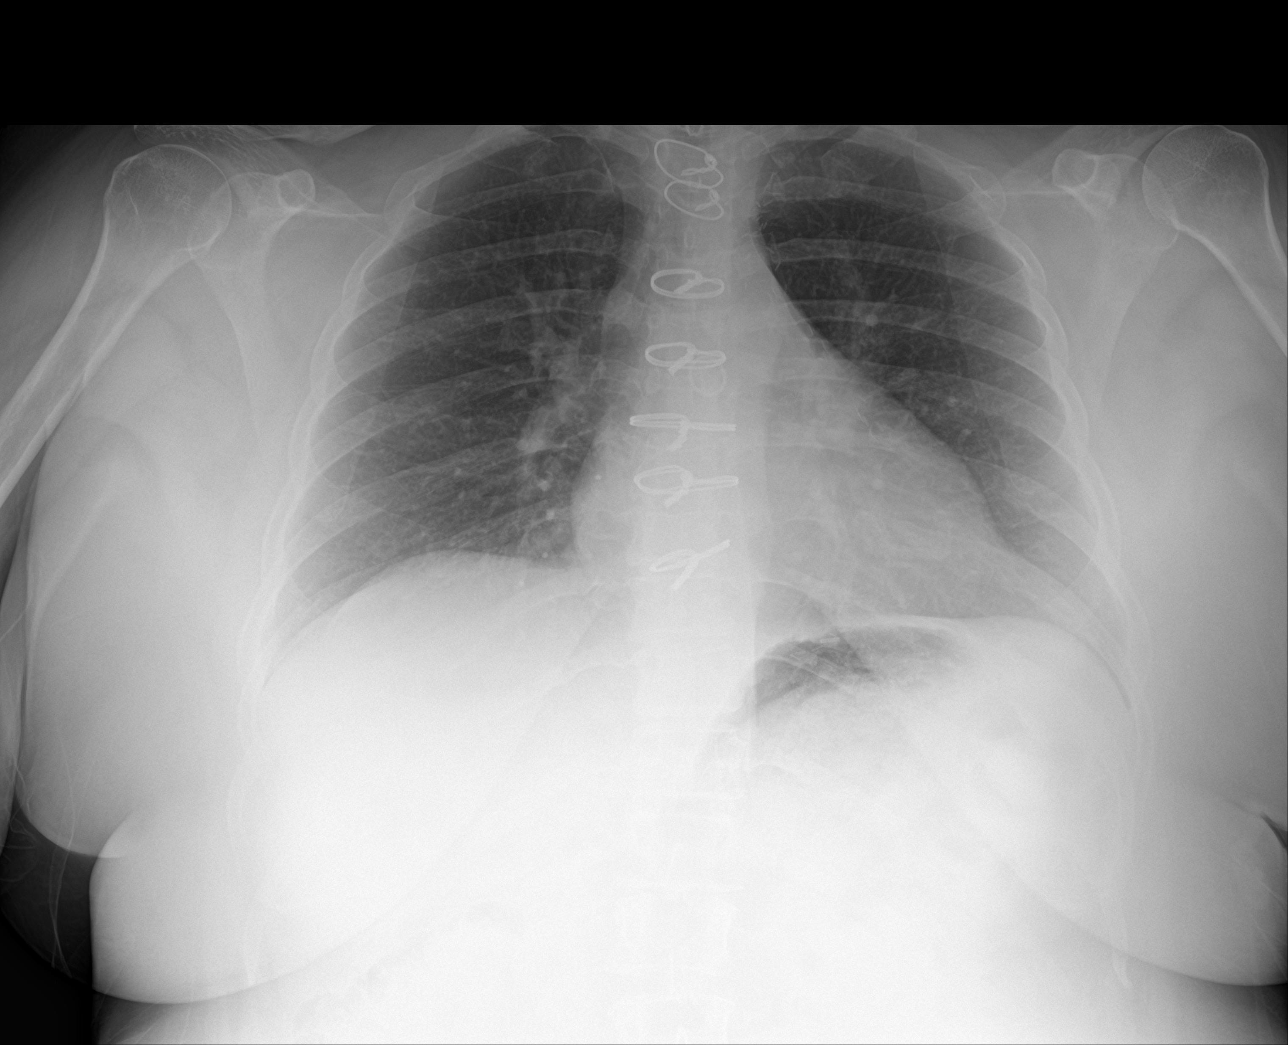

[chest ap (2 of 2)]
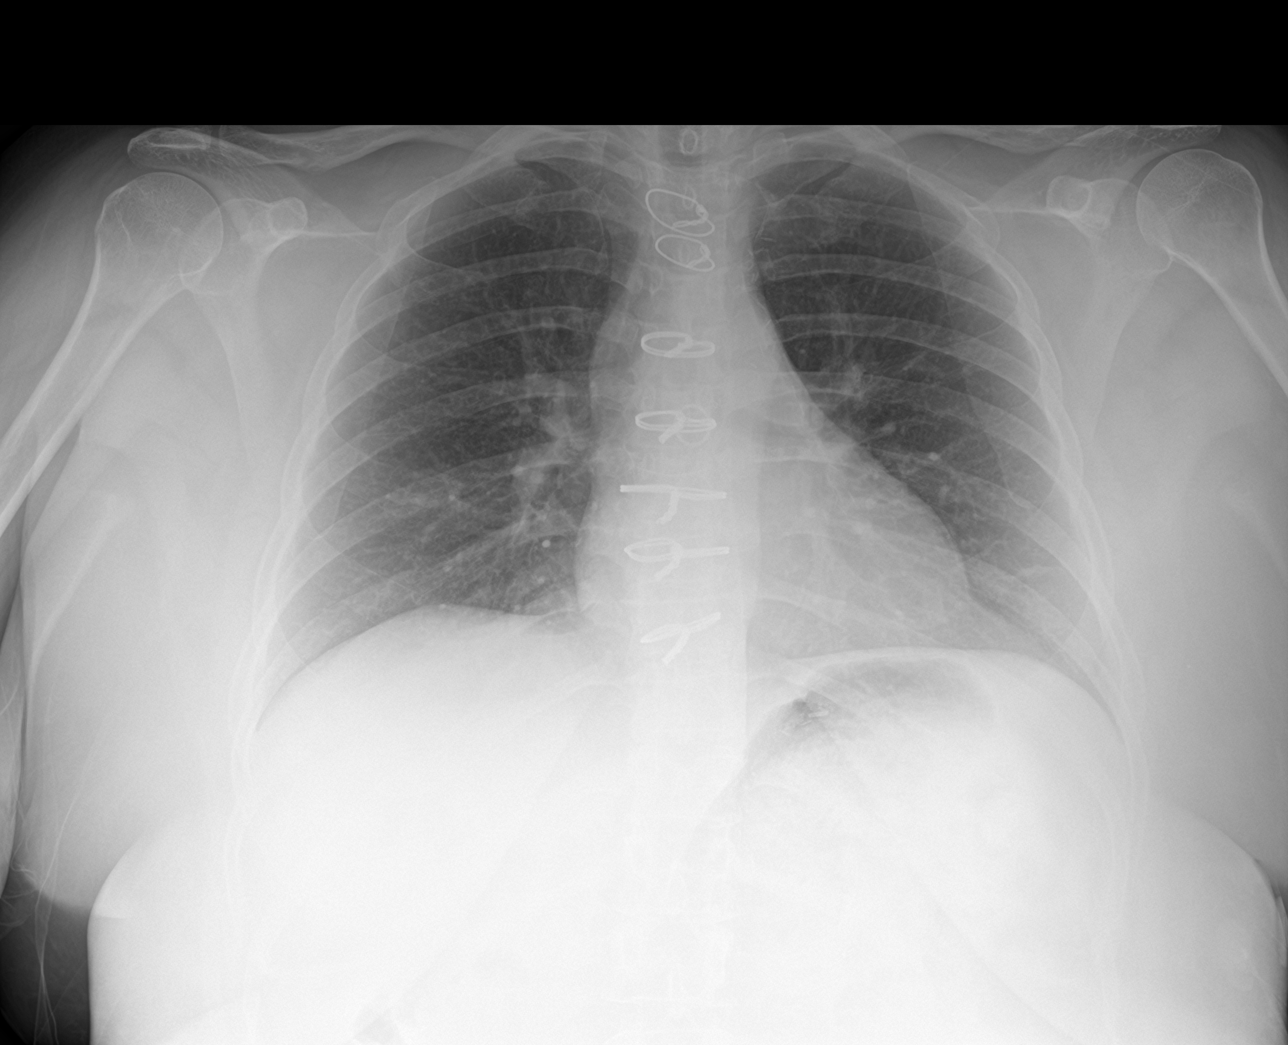

[3 of 3 positions shown; findings below may reference images not displayed]

FINDINGS: The heart size and mediastinal contours are within normal limits.
Overlying median sternotomy wires are present. Both lungs are clear.
The visualized skeletal structures are unremarkable. Again noted a
slight superior compression deformity of the L1 vertebral body.
IMPRESSION: No active cardiopulmonary disease.

## 2022-07-26 ENCOUNTER — Other Ambulatory Visit (HOSPITAL_COMMUNITY): Payer: Self-pay

## 2022-08-05 DIAGNOSIS — E785 Hyperlipidemia, unspecified: Secondary | ICD-10-CM | POA: Diagnosis not present

## 2022-08-05 DIAGNOSIS — I519 Heart disease, unspecified: Secondary | ICD-10-CM | POA: Diagnosis not present

## 2022-08-05 DIAGNOSIS — R7989 Other specified abnormal findings of blood chemistry: Secondary | ICD-10-CM | POA: Diagnosis not present

## 2022-08-11 ENCOUNTER — Other Ambulatory Visit (HOSPITAL_COMMUNITY): Payer: Self-pay

## 2022-08-11 DIAGNOSIS — Z1331 Encounter for screening for depression: Secondary | ICD-10-CM | POA: Diagnosis not present

## 2022-08-11 DIAGNOSIS — K7689 Other specified diseases of liver: Secondary | ICD-10-CM | POA: Diagnosis not present

## 2022-08-11 DIAGNOSIS — D72829 Elevated white blood cell count, unspecified: Secondary | ICD-10-CM | POA: Diagnosis not present

## 2022-08-11 DIAGNOSIS — L309 Dermatitis, unspecified: Secondary | ICD-10-CM | POA: Diagnosis not present

## 2022-08-11 DIAGNOSIS — I5032 Chronic diastolic (congestive) heart failure: Secondary | ICD-10-CM | POA: Diagnosis not present

## 2022-08-11 DIAGNOSIS — D729 Disorder of white blood cells, unspecified: Secondary | ICD-10-CM | POA: Diagnosis not present

## 2022-08-11 DIAGNOSIS — Z1389 Encounter for screening for other disorder: Secondary | ICD-10-CM | POA: Diagnosis not present

## 2022-08-11 DIAGNOSIS — Z0000 Encounter for general adult medical examination without abnormal findings: Secondary | ICD-10-CM | POA: Diagnosis not present

## 2022-08-11 DIAGNOSIS — K6282 Dysplasia of anus: Secondary | ICD-10-CM | POA: Diagnosis not present

## 2022-08-11 DIAGNOSIS — I25728 Atherosclerosis of autologous artery coronary artery bypass graft(s) with other forms of angina pectoris: Secondary | ICD-10-CM | POA: Diagnosis not present

## 2022-10-28 ENCOUNTER — Other Ambulatory Visit (HOSPITAL_COMMUNITY): Payer: Self-pay

## 2022-10-28 MED ORDER — LAGEVRIO 200 MG PO CAPS
4.0000 | ORAL_CAPSULE | Freq: Two times a day (BID) | ORAL | 0 refills | Status: DC
  Filled 2022-10-28: qty 40, 5d supply, fill #0

## 2023-02-07 ENCOUNTER — Other Ambulatory Visit (HOSPITAL_COMMUNITY): Payer: Self-pay

## 2023-02-16 ENCOUNTER — Other Ambulatory Visit (HOSPITAL_COMMUNITY): Payer: Self-pay | Admitting: Internal Medicine

## 2023-02-16 DIAGNOSIS — D729 Disorder of white blood cells, unspecified: Secondary | ICD-10-CM | POA: Diagnosis not present

## 2023-02-16 DIAGNOSIS — I25728 Atherosclerosis of autologous artery coronary artery bypass graft(s) with other forms of angina pectoris: Secondary | ICD-10-CM | POA: Diagnosis not present

## 2023-02-16 DIAGNOSIS — Z6841 Body Mass Index (BMI) 40.0 and over, adult: Secondary | ICD-10-CM | POA: Diagnosis not present

## 2023-02-16 DIAGNOSIS — K7689 Other specified diseases of liver: Secondary | ICD-10-CM

## 2023-02-16 DIAGNOSIS — I5032 Chronic diastolic (congestive) heart failure: Secondary | ICD-10-CM | POA: Diagnosis not present

## 2023-02-16 DIAGNOSIS — D72829 Elevated white blood cell count, unspecified: Secondary | ICD-10-CM | POA: Diagnosis not present

## 2023-02-16 DIAGNOSIS — R12 Heartburn: Secondary | ICD-10-CM | POA: Diagnosis not present

## 2023-02-16 DIAGNOSIS — K760 Fatty (change of) liver, not elsewhere classified: Secondary | ICD-10-CM | POA: Diagnosis not present

## 2023-02-16 DIAGNOSIS — L508 Other urticaria: Secondary | ICD-10-CM | POA: Diagnosis not present

## 2023-02-17 ENCOUNTER — Ambulatory Visit (HOSPITAL_COMMUNITY)
Admission: RE | Admit: 2023-02-17 | Discharge: 2023-02-17 | Disposition: A | Discharge status: Home / Self Care | Payer: Commercial Managed Care - PPO | Source: Ambulatory Visit | Attending: Internal Medicine | Admitting: Internal Medicine

## 2023-02-17 DIAGNOSIS — K7689 Other specified diseases of liver: Secondary | ICD-10-CM | POA: Diagnosis not present

## 2023-02-17 DIAGNOSIS — K760 Fatty (change of) liver, not elsewhere classified: Secondary | ICD-10-CM | POA: Diagnosis not present

## 2023-03-16 ENCOUNTER — Other Ambulatory Visit (HOSPITAL_COMMUNITY): Payer: Self-pay

## 2023-03-16 DIAGNOSIS — L309 Dermatitis, unspecified: Secondary | ICD-10-CM | POA: Diagnosis not present

## 2023-03-16 MED ORDER — BETAMETHASONE DIPROPIONATE 0.05 % EX CREA
1.0000 | TOPICAL_CREAM | Freq: Two times a day (BID) | CUTANEOUS | 3 refills | Status: AC
  Filled 2023-03-16: qty 45, 30d supply, fill #0

## 2023-05-08 ENCOUNTER — Other Ambulatory Visit: Payer: Self-pay | Admitting: Interventional Cardiology

## 2023-05-08 ENCOUNTER — Other Ambulatory Visit (HOSPITAL_COMMUNITY): Payer: Self-pay

## 2023-05-09 ENCOUNTER — Other Ambulatory Visit (HOSPITAL_COMMUNITY): Payer: Self-pay

## 2023-05-09 ENCOUNTER — Encounter: Payer: Self-pay | Admitting: Interventional Cardiology

## 2023-05-09 MED ORDER — ATORVASTATIN CALCIUM 80 MG PO TABS
80.0000 mg | ORAL_TABLET | Freq: Every day | ORAL | 0 refills | Status: DC
  Filled 2023-05-09: qty 90, 90d supply, fill #0

## 2023-05-09 MED ORDER — METOPROLOL TARTRATE 25 MG PO TABS
25.0000 mg | ORAL_TABLET | Freq: Two times a day (BID) | ORAL | 0 refills | Status: DC
  Filled 2023-05-09: qty 180, 90d supply, fill #0

## 2023-05-10 ENCOUNTER — Other Ambulatory Visit (HOSPITAL_COMMUNITY): Payer: Self-pay

## 2023-05-10 DIAGNOSIS — Z1231 Encounter for screening mammogram for malignant neoplasm of breast: Secondary | ICD-10-CM | POA: Diagnosis not present

## 2023-05-10 DIAGNOSIS — Z6841 Body Mass Index (BMI) 40.0 and over, adult: Secondary | ICD-10-CM | POA: Diagnosis not present

## 2023-05-10 DIAGNOSIS — Z01419 Encounter for gynecological examination (general) (routine) without abnormal findings: Secondary | ICD-10-CM | POA: Diagnosis not present

## 2023-05-15 DIAGNOSIS — L309 Dermatitis, unspecified: Secondary | ICD-10-CM | POA: Diagnosis not present

## 2023-06-12 ENCOUNTER — Encounter: Payer: Self-pay | Admitting: Interventional Cardiology

## 2023-06-12 ENCOUNTER — Other Ambulatory Visit (HOSPITAL_COMMUNITY): Payer: Self-pay

## 2023-06-12 ENCOUNTER — Ambulatory Visit: Payer: Commercial Managed Care - PPO | Attending: Interventional Cardiology | Admitting: Interventional Cardiology

## 2023-06-12 VITALS — BP 112/70 | HR 62 | Ht 59.0 in | Wt 210.0 lb

## 2023-06-12 DIAGNOSIS — R0609 Other forms of dyspnea: Secondary | ICD-10-CM

## 2023-06-12 DIAGNOSIS — Z951 Presence of aortocoronary bypass graft: Secondary | ICD-10-CM

## 2023-06-12 DIAGNOSIS — E785 Hyperlipidemia, unspecified: Secondary | ICD-10-CM | POA: Diagnosis not present

## 2023-06-12 DIAGNOSIS — I10 Essential (primary) hypertension: Secondary | ICD-10-CM

## 2023-06-12 MED ORDER — NITROGLYCERIN 0.4 MG SL SUBL
0.4000 mg | SUBLINGUAL_TABLET | SUBLINGUAL | 5 refills | Status: AC | PRN
  Filled 2023-06-12: qty 25, 5d supply, fill #0

## 2023-06-12 NOTE — Patient Instructions (Signed)
Medication Instructions:  Your physician recommends that you continue on your current medications as directed. Please refer to the Current Medication list given to you today.  *If you need a refill on your cardiac medications before your next appointment, please call your pharmacy*  Follow-Up: At [facility], you and your health needs are our priority.  As part of our continuing mission to provide you with exceptional heart care, we have created designated Provider Care Teams.  These Care Teams include your primary Cardiologist (physician) and Advanced Practice Providers (APPs -  Physician Assistants and Nurse Practitioners) who all work together to provide you with the care you need, when you need it.  Your next appointment:   1 year  Provider:   Verne Carrow, MD

## 2023-06-12 NOTE — Progress Notes (Signed)
Cardiology Office Note   Date:  06/12/2023   ID:  [name], DOB 1966/01/19, MRN [id number]  PCP:  Charlane Ferretti, DO    No chief complaint on file.  CAD  Wt Readings from Last 3 Encounters:  06/12/23 210 lb (95.3 kg)  06/06/22 207 lb (93.9 kg)  05/16/22 207 lb (93.9 kg)       History of Present Illness: [name] is a 57 y.o. female   with a hx of CAD with anterior STEMI in March that was treated with DES to the LAD, HTN. Subsequent progressive exertional chest pain and dyspnea led to re-cath and severe ostial LAD progression  --> 2 vessel CABG with LIMA to LAD and SVG to DX 04/08/2019, and bypass pericarditis cleared with colchicine (GI upset) and ibuprofen.  She has had dyspnea since bypass surgery. She had an echocardiogram done about a month after surgery because of complaints of shortness of breath. Systolic function was normal, diastolic function was normal, and BNP was normal.   Denies : Chest pain with her usual activities. Dizziness. Leg edema. Nitroglycerin use. Orthopnea. Palpitations. Paroxysmal nocturnal dyspnea. Shortness of breath. Syncope.    If she really pushes herself, she can feel some chest pain but it is not as bad as what she had in 2020. No NTG use.  She does not think she needs a stress test now but is willing to let us know if sx worsen.  She thinks a lot of her issues may be from lack of activity over the past year.   Past Medical History:  Diagnosis Date   Acute non-ST elevation myocardial infarction (NSTEMI) (H[facility]) 12/16/2018   Allergy    Atherosclerosis    Blood transfusion without reported diagnosis    CAD S/P DES PCI -pLAD 12/16/2018   Cath 12/15/2017: Successful proximal LAD synergy 3.0 x 15 DES postdilated to 3.25 mm reducing the stenosis to 0% ; ? 60-70% ostial LAD noted on some images.   Elevated alkaline phosphatase level    GERD (gastroesophageal reflux disease)    Hepatic steatosis    Hyperlipidemia    Hyperplastic colon  polyp    Hypertension    Pericarditis as complication of acute myocardial infarction (HCC)    PONV (postoperative nausea and vomiting)     Past Surgical History:  Procedure Laterality Date   CARDIAC CATHETERIZATION     CESAREAN SECTION     CORONARY ARTERY BYPASS GRAFT N/A 04/08/2019   Procedure: CORONARY ARTERY BYPASS GRAFTING (CABG) x two , using left internal mammary artery and right leg greater saphenous vein harvested endoscopically;  Surgeon: Kerin Perna, MD;  Location: [facility];  Service: Open Heart Surgery;  Laterality: N/A;   CORONARY/GRAFT ACUTE MI REVASCULARIZATION N/A 12/16/2018   Procedure: Coronary/Graft Acute MI Revascularization;  Surgeon: Lyn Records, MD;  [facility]: [facility];  Service: Cardiovascular;  Laterality: N/A;   IUD REMOVAL N/A 05/16/2013   Procedure: INTRAUTERINE DEVICE (IUD) REMOVAL;  Surgeon: Juluis Mire, MD;  Location: [facility];  Service: Gynecology;  Laterality: N/A;   LAPAROSCOPIC TUBAL LIGATION Bilateral 05/16/2013   Procedure: LAPAROSCOPIC TUBAL LIGATION;  Surgeon: Juluis Mire, MD;  Location: [facility];  Service: Gynecology;  Laterality: Bilateral;   LEFT HEART CATH AND CORONARY ANGIOGRAPHY N/A 12/16/2018   Procedure: LEFT HEART CATH AND CORONARY ANGIOGRAPHY;  Surgeon: Lyn Records, MD;  Location: [facility];  Service: Cardiovascular;  Laterality: N/A;   LEFT HEART CATH AND  CORONARY ANGIOGRAPHY N/A 03/26/2019   Procedure: LEFT HEART CATH AND CORONARY ANGIOGRAPHY;  Surgeon: Lyn Records, MD;  Location: [facility];  Service: Cardiovascular;  Laterality: N/A;   TEE WITHOUT CARDIOVERSION N/A 04/08/2019   Procedure: TRANSESOPHAGEAL ECHOCARDIOGRAM (TEE);  Surgeon: Donata Clay, Theron Arista, MD;  Location: [facility];  Service: Open Heart Surgery;  Laterality: N/A;   TUBAL LIGATION       Current Outpatient Medications  Medication Sig Dispense Refill   albuterol (VENTOLIN HFA) 108 (90 Base) MCG/ACT inhaler INHALE 2 PUFFS INTO THE LUNGS EVERY 4 HOURS AS  NEEDED. 8.5 g 1   aspirin 81 MG chewable tablet Chew 1 tablet (81 mg total) by mouth daily. 30 tablet 0   atorvastatin (LIPITOR) 80 MG tablet Take 1 tablet (80 mg total) by mouth daily at 6 PM. 90 tablet 0   betamethasone dipropionate 0.05 % cream Apply as directed to affected area 2 (two) times daily, taper use as able 45 g 3   Famotidine (PEPCID AC PO) Take 20 mg by mouth as needed.     loratadine (CLARITIN) 10 MG tablet Take 10 mg by mouth daily as needed for allergies.     metoprolol tartrate (LOPRESSOR) 25 MG tablet Take 1 tablet (25 mg total) by mouth 2 (two) times daily. 180 tablet 0   nitroGLYCERIN (NITROSTAT) 0.4 MG SL tablet PLACE 1 TABLET UNDER THE TONGUE EVERY FIVE MINUTES AS NEEDED FOR CHEST PAIN. 25 tablet 5   triamcinolone cream (KENALOG) 0.1 % as needed.     No current facility-administered medications for this visit.    Allergies:   Tramadol and Tetracycline    Social History:  The patient  reports that she has never smoked. She has never used smokeless tobacco. She reports current alcohol use. She reports that she does not use drugs.   Family History:  The patient's family history includes CAD in her mother; Cancer in her paternal grandfather; Colon cancer (age of onset: 42) in her father; Heart attack in her maternal grandfather, mother, and another family member; Heart disease in her brother and another family member; Heart failure in her maternal grandmother; High blood pressure in her mother; Hyperlipidemia in her mother and sister.    ROS:  Please see the history of present illness.   Otherwise, review of systems are positive for decreased activity over the last year.   All other systems are reviewed and negative.    PHYSICAL EXAM: VS:  BP 112/70   Pulse 62   Ht 4\' 11"  (1.499 m)   Wt 210 lb (95.3 kg)   LMP 01/20/2021 (Approximate)   SpO2 97%   BMI 42.41 kg/m  , BMI Body mass index is 42.41 kg/m. GEN: Well nourished, well developed, in no acute distress HEENT:  normal Neck: no JVD, carotid bruits, or masses Cardiac: RRR; no murmurs, rubs, or gallops,no edema  Respiratory:  clear to auscultation bilaterally, normal work of breathing GI: soft, nontender, nondistended, + BS MS: no deformity or atrophy Skin: warm and dry, no rash Neuro:  Strength and sensation are intact Psych: euthymic mood, full affect   EKG:   The ekg ordered today demonstrates normal ECG   Recent Labs: No results found for requested labs within last 365 days.   Lipid Panel    Component Value Date/Time   CHOL 99 (L) 06/02/2020 0758   TRIG 89 06/02/2020 0758   HDL 39 (L) 06/02/2020 0758   CHOLHDL 2.5 06/02/2020 0758   CHOLHDL 4.4  12/16/2018 0629   VLDL 15 12/16/2018 0629   LDLCALC 42 06/02/2020 0758     Other studies Reviewed: Additional studies/ records that were reviewed today with results demonstrating: LDL 49 in October 2023 total cholesterol 102 HDL 37 triglycerides 82.   ASSESSMENT AND PLAN:  CAD: s/p LIMA to LAD or ostial LAD stent restenosis. No sx like what she had in 2020.  Let us know if there is any change in the exertional symptoms and if they become more like they were in 2020.  Try to increase activity gradually but if chest discomfort gets worse, let us know before trying to increase the intensity of exercise.  No bleeding problems.  Would have a low threshold for stress test. DOE: stable.  Hyperlipidemia: Whole food, plant based diet. Avoid processed foods. HTN: The current medical regimen is effective;  continue present plan and medications. Morbid obesity: Long term issue. Hoping to improve diet when her kitchen remodel is done.     Current medicines are reviewed at length with the patient today.  The patient concerns regarding her medicines were addressed.  The following changes have been made:  No change  Labs/ tests ordered today include:   Orders Placed This Encounter  Procedures   EKG 12-Lead    Recommend 150 minutes/week of  aerobic exercise Low fat, low carb, high fiber diet recommended  Disposition:   FU in 6 months   Signed, Lance Muss, MD  06/12/2023 11:53 AM    [facility] [address] [place], [place], [place]  [address] Phone: [phone]; Fax: [phone]

## 2023-06-26 DIAGNOSIS — L309 Dermatitis, unspecified: Secondary | ICD-10-CM | POA: Diagnosis not present

## 2023-08-06 ENCOUNTER — Other Ambulatory Visit: Payer: Self-pay | Admitting: Interventional Cardiology

## 2023-08-07 ENCOUNTER — Other Ambulatory Visit (HOSPITAL_COMMUNITY): Payer: Self-pay

## 2023-08-07 MED ORDER — ATORVASTATIN CALCIUM 80 MG PO TABS
80.0000 mg | ORAL_TABLET | Freq: Every day | ORAL | 0 refills | Status: DC
  Filled 2023-08-07: qty 90, 90d supply, fill #0

## 2023-08-07 MED ORDER — METOPROLOL TARTRATE 25 MG PO TABS
25.0000 mg | ORAL_TABLET | Freq: Two times a day (BID) | ORAL | 0 refills | Status: DC
  Filled 2023-08-07: qty 180, 90d supply, fill #0

## 2023-08-10 DIAGNOSIS — Z0001 Encounter for general adult medical examination with abnormal findings: Secondary | ICD-10-CM | POA: Diagnosis not present

## 2023-08-10 DIAGNOSIS — R7401 Elevation of levels of liver transaminase levels: Secondary | ICD-10-CM | POA: Diagnosis not present

## 2023-08-10 DIAGNOSIS — Z1389 Encounter for screening for other disorder: Secondary | ICD-10-CM | POA: Diagnosis not present

## 2023-08-17 DIAGNOSIS — Z0000 Encounter for general adult medical examination without abnormal findings: Secondary | ICD-10-CM | POA: Diagnosis not present

## 2023-08-17 DIAGNOSIS — R748 Abnormal levels of other serum enzymes: Secondary | ICD-10-CM | POA: Diagnosis not present

## 2023-08-17 DIAGNOSIS — I5032 Chronic diastolic (congestive) heart failure: Secondary | ICD-10-CM | POA: Diagnosis not present

## 2023-08-17 DIAGNOSIS — I25728 Atherosclerosis of autologous artery coronary artery bypass graft(s) with other forms of angina pectoris: Secondary | ICD-10-CM | POA: Diagnosis not present

## 2023-08-17 DIAGNOSIS — D72829 Elevated white blood cell count, unspecified: Secondary | ICD-10-CM | POA: Diagnosis not present

## 2023-08-17 DIAGNOSIS — L309 Dermatitis, unspecified: Secondary | ICD-10-CM | POA: Diagnosis not present

## 2023-08-17 DIAGNOSIS — M25551 Pain in right hip: Secondary | ICD-10-CM | POA: Diagnosis not present

## 2023-08-17 DIAGNOSIS — R12 Heartburn: Secondary | ICD-10-CM | POA: Diagnosis not present

## 2023-08-17 DIAGNOSIS — Z6841 Body Mass Index (BMI) 40.0 and over, adult: Secondary | ICD-10-CM | POA: Diagnosis not present

## 2023-09-13 ENCOUNTER — Other Ambulatory Visit (HOSPITAL_COMMUNITY): Payer: Self-pay

## 2023-09-13 MED ORDER — PREDNISONE 10 MG PO TABS
ORAL_TABLET | ORAL | 0 refills | Status: AC
  Filled 2023-09-13: qty 21, 7d supply, fill #0

## 2023-10-31 ENCOUNTER — Other Ambulatory Visit: Payer: Self-pay | Admitting: Interventional Cardiology

## 2023-11-01 ENCOUNTER — Other Ambulatory Visit (HOSPITAL_COMMUNITY): Payer: Self-pay

## 2023-11-01 MED ORDER — ATORVASTATIN CALCIUM 80 MG PO TABS
80.0000 mg | ORAL_TABLET | Freq: Every day | ORAL | 2 refills | Status: DC
  Filled 2023-11-01: qty 90, 90d supply, fill #0
  Filled 2024-01-30: qty 90, 90d supply, fill #1
  Filled 2024-04-29: qty 90, 90d supply, fill #2

## 2023-11-01 MED ORDER — METOPROLOL TARTRATE 25 MG PO TABS
25.0000 mg | ORAL_TABLET | Freq: Two times a day (BID) | ORAL | 2 refills | Status: DC
  Filled 2023-11-01: qty 180, 90d supply, fill #0
  Filled 2024-01-30: qty 180, 90d supply, fill #1
  Filled 2024-04-29: qty 180, 90d supply, fill #2

## 2024-02-21 DIAGNOSIS — L309 Dermatitis, unspecified: Secondary | ICD-10-CM | POA: Diagnosis not present

## 2024-02-21 DIAGNOSIS — D729 Disorder of white blood cells, unspecified: Secondary | ICD-10-CM | POA: Diagnosis not present

## 2024-02-21 DIAGNOSIS — Z6841 Body Mass Index (BMI) 40.0 and over, adult: Secondary | ICD-10-CM | POA: Diagnosis not present

## 2024-02-21 DIAGNOSIS — R748 Abnormal levels of other serum enzymes: Secondary | ICD-10-CM | POA: Diagnosis not present

## 2024-02-21 DIAGNOSIS — D72829 Elevated white blood cell count, unspecified: Secondary | ICD-10-CM | POA: Diagnosis not present

## 2024-02-21 DIAGNOSIS — M25551 Pain in right hip: Secondary | ICD-10-CM | POA: Diagnosis not present

## 2024-02-21 DIAGNOSIS — I5032 Chronic diastolic (congestive) heart failure: Secondary | ICD-10-CM | POA: Diagnosis not present

## 2024-02-21 DIAGNOSIS — I25728 Atherosclerosis of autologous artery coronary artery bypass graft(s) with other forms of angina pectoris: Secondary | ICD-10-CM | POA: Diagnosis not present

## 2024-02-21 DIAGNOSIS — N951 Menopausal and female climacteric states: Secondary | ICD-10-CM | POA: Diagnosis not present

## 2024-05-23 DIAGNOSIS — Z6841 Body Mass Index (BMI) 40.0 and over, adult: Secondary | ICD-10-CM | POA: Diagnosis not present

## 2024-05-23 DIAGNOSIS — Z1231 Encounter for screening mammogram for malignant neoplasm of breast: Secondary | ICD-10-CM | POA: Diagnosis not present

## 2024-05-23 DIAGNOSIS — Z124 Encounter for screening for malignant neoplasm of cervix: Secondary | ICD-10-CM | POA: Diagnosis not present

## 2024-05-23 DIAGNOSIS — Z1151 Encounter for screening for human papillomavirus (HPV): Secondary | ICD-10-CM | POA: Diagnosis not present

## 2024-05-23 DIAGNOSIS — Z01419 Encounter for gynecological examination (general) (routine) without abnormal findings: Secondary | ICD-10-CM | POA: Diagnosis not present

## 2024-06-12 DIAGNOSIS — N950 Postmenopausal bleeding: Secondary | ICD-10-CM | POA: Diagnosis not present

## 2024-07-25 ENCOUNTER — Other Ambulatory Visit: Payer: Self-pay | Admitting: Interventional Cardiology

## 2024-07-26 ENCOUNTER — Other Ambulatory Visit (HOSPITAL_COMMUNITY): Payer: Self-pay

## 2024-07-29 ENCOUNTER — Other Ambulatory Visit (HOSPITAL_COMMUNITY): Payer: Self-pay

## 2024-07-29 MED ORDER — METOPROLOL TARTRATE 25 MG PO TABS
25.0000 mg | ORAL_TABLET | Freq: Two times a day (BID) | ORAL | 0 refills | Status: DC
  Filled 2024-07-29: qty 60, 30d supply, fill #0

## 2024-07-29 MED ORDER — ATORVASTATIN CALCIUM 80 MG PO TABS
80.0000 mg | ORAL_TABLET | Freq: Every day | ORAL | 0 refills | Status: DC
  Filled 2024-07-29: qty 30, 30d supply, fill #0

## 2024-07-30 ENCOUNTER — Other Ambulatory Visit (HOSPITAL_COMMUNITY): Payer: Self-pay

## 2024-08-20 DIAGNOSIS — E785 Hyperlipidemia, unspecified: Secondary | ICD-10-CM | POA: Diagnosis not present

## 2024-08-20 DIAGNOSIS — I25728 Atherosclerosis of autologous artery coronary artery bypass graft(s) with other forms of angina pectoris: Secondary | ICD-10-CM | POA: Diagnosis not present

## 2024-08-20 DIAGNOSIS — D72829 Elevated white blood cell count, unspecified: Secondary | ICD-10-CM | POA: Diagnosis not present

## 2024-08-20 DIAGNOSIS — Z780 Asymptomatic menopausal state: Secondary | ICD-10-CM | POA: Diagnosis not present

## 2024-08-27 DIAGNOSIS — L508 Other urticaria: Secondary | ICD-10-CM | POA: Diagnosis not present

## 2024-08-27 DIAGNOSIS — R12 Heartburn: Secondary | ICD-10-CM | POA: Diagnosis not present

## 2024-08-27 DIAGNOSIS — L309 Dermatitis, unspecified: Secondary | ICD-10-CM | POA: Diagnosis not present

## 2024-08-27 DIAGNOSIS — I5032 Chronic diastolic (congestive) heart failure: Secondary | ICD-10-CM | POA: Diagnosis not present

## 2024-08-27 DIAGNOSIS — Z0000 Encounter for general adult medical examination without abnormal findings: Secondary | ICD-10-CM | POA: Diagnosis not present

## 2024-08-27 DIAGNOSIS — I25728 Atherosclerosis of autologous artery coronary artery bypass graft(s) with other forms of angina pectoris: Secondary | ICD-10-CM | POA: Diagnosis not present

## 2024-08-27 DIAGNOSIS — Z1331 Encounter for screening for depression: Secondary | ICD-10-CM | POA: Diagnosis not present

## 2024-08-27 DIAGNOSIS — K760 Fatty (change of) liver, not elsewhere classified: Secondary | ICD-10-CM | POA: Diagnosis not present

## 2024-08-27 DIAGNOSIS — Z6841 Body Mass Index (BMI) 40.0 and over, adult: Secondary | ICD-10-CM | POA: Diagnosis not present

## 2024-08-28 ENCOUNTER — Other Ambulatory Visit: Payer: Self-pay | Admitting: Physician Assistant

## 2024-08-30 ENCOUNTER — Telehealth: Payer: Self-pay | Admitting: Cardiovascular Disease

## 2024-08-30 ENCOUNTER — Other Ambulatory Visit (HOSPITAL_COMMUNITY): Payer: Self-pay

## 2024-08-30 MED ORDER — METOPROLOL TARTRATE 25 MG PO TABS
25.0000 mg | ORAL_TABLET | Freq: Two times a day (BID) | ORAL | 0 refills | Status: DC
  Filled 2024-08-30: qty 90, 45d supply, fill #0

## 2024-08-30 NOTE — Telephone Encounter (Signed)
*  STAT* If patient is at the pharmacy, call can be transferred to refill team.   1. Which medications need to be refilled? (please list name of each medication and dose if known)   metoprolol  tartrate (LOPRESSOR ) 25 MG tablet   2. Would you like to learn more about the convenience, safety, & potential cost savings by using the [facility]?   3. Are you open to using the [facility] (Type [facility]. ).  4. Which pharmacy/location (including street and city if local pharmacy) is medication to be sent to?  [facility]   5. Do they need a 30 day or 90 day supply?   Patient stated she only has 1 tablet left.  Patient has appointment scheduled with Dr. Verlin on 10/31/24.

## 2024-08-30 NOTE — Telephone Encounter (Signed)
 Prescription sent

## 2024-09-17 ENCOUNTER — Other Ambulatory Visit: Payer: Self-pay | Admitting: Physician Assistant

## 2024-09-17 DIAGNOSIS — E785 Hyperlipidemia, unspecified: Secondary | ICD-10-CM

## 2024-09-18 ENCOUNTER — Other Ambulatory Visit (HOSPITAL_COMMUNITY): Payer: Self-pay

## 2024-09-18 MED ORDER — ATORVASTATIN CALCIUM 80 MG PO TABS
80.0000 mg | ORAL_TABLET | Freq: Every day | ORAL | 0 refills | Status: AC
  Filled 2024-09-18: qty 90, 90d supply, fill #0

## 2024-10-14 ENCOUNTER — Other Ambulatory Visit: Payer: Self-pay | Admitting: Cardiovascular Disease

## 2024-10-15 ENCOUNTER — Other Ambulatory Visit (HOSPITAL_COMMUNITY): Payer: Self-pay

## 2024-10-15 MED ORDER — METOPROLOL TARTRATE 25 MG PO TABS
25.0000 mg | ORAL_TABLET | Freq: Two times a day (BID) | ORAL | 0 refills | Status: AC
  Filled 2024-10-15: qty 180, 90d supply, fill #0

## 2024-10-20 ENCOUNTER — Telehealth

## 2024-10-20 DIAGNOSIS — L309 Dermatitis, unspecified: Secondary | ICD-10-CM

## 2024-10-20 MED ORDER — PREDNISONE 20 MG PO TABS: 20.0000 mg | ORAL_TABLET | Freq: Every day | ORAL | 0 refills | Status: DC

## 2024-10-20 MED ORDER — TRIAMCINOLONE ACETONIDE 0.1 % EX CREA: TOPICAL_CREAM | CUTANEOUS | 0 refills | Status: AC | PRN

## 2024-10-20 MED ORDER — PREDNISONE 20 MG PO TABS: 40.0000 mg | ORAL_TABLET | Freq: Every day | ORAL | 0 refills | Status: AC

## 2024-10-20 NOTE — Patient Instructions (Signed)
 " [name], thank you for joining Taylor LELON Servant, NP for today's virtual visit.  While this provider is not your primary care provider (PCP), if your PCP is located in our provider database this encounter information will be shared with them immediately following your visit.   A [name] account gives you access to today's visit and all your visits, tests, and labs performed at [name]  click here if you don't have a [name] account or go to [name]  Consent: (Patient) [name] provided verbal consent for this virtual visit at the beginning of the encounter.  Current Medications:  Current Outpatient Medications:    albuterol  (VENTOLIN  HFA) 108 (90 Base) MCG/ACT inhaler, INHALE 2 PUFFS INTO THE LUNGS EVERY 4 HOURS AS NEEDED., Disp: 8.5 g, Rfl: 1   aspirin  81 MG chewable tablet, Chew 1 tablet (81 mg total) by mouth daily., Disp: 30 tablet, Rfl: 0   atorvastatin  (LIPITOR ) 80 MG tablet, Take 1 tablet (80 mg total) by mouth daily at 6 PM., Disp: 90 tablet, Rfl: 0   betamethasone  dipropionate 0.05 % cream, Apply as directed to affected area 2 (two) times daily, taper use as able, Disp: 45 g, Rfl: 3   Famotidine  (PEPCID  AC PO), Take 20 mg by mouth as needed., Disp: , Rfl:    loratadine  (CLARITIN ) 10 MG tablet, Take 10 mg by mouth daily as needed for allergies., Disp: , Rfl:    metoprolol  tartrate (LOPRESSOR ) 25 MG tablet, Take 1 tablet (25 mg total) by mouth 2 (two) times daily., Disp: 180 tablet, Rfl: 0   nitroGLYCERIN  (NITROSTAT ) 0.4 MG SL tablet, Place 1 tablet (0.4 mg total) under the tongue every 5 (five) minutes as needed for chest pain., Disp: 25 tablet, Rfl: 5   predniSONE  (DELTASONE ) 20 MG tablet, Take 2 tablets (40 mg total) by mouth daily with breakfast for 7 days., Disp: 14 tablet, Rfl: 0   triamcinolone  cream (KENALOG ) 0.1 %, Apply topically as needed., Disp: 30 g, Rfl: 0   Medications ordered in this encounter:  Meds  ordered this encounter  Medications   DISCONTD: predniSONE  (DELTASONE ) 20 MG tablet    Sig: Take 1 tablet (20 mg total) by mouth daily with breakfast for 7 days.    Dispense:  7 tablet    Refill:  0    Supervising Provider:   LAMPTEY, PHILIP O [[id number]]   predniSONE  (DELTASONE ) 20 MG tablet    Sig: Take 2 tablets (40 mg total) by mouth daily with breakfast for 7 days.    Dispense:  14 tablet    Refill:  0    Supervising Provider:   LAMPTEY, PHILIP O [[id number]]   triamcinolone  cream (KENALOG ) 0.1 %    Sig: Apply topically as needed.    Dispense:  30 g    Refill:  0    Supervising Provider:   BLAISE ALEENE KIDD [[id number]]     *If you need refills on other medications prior to your next appointment, please contact your pharmacy*  Follow-Up: Call back or seek an in-person evaluation if the symptoms worsen or if the condition fails to improve as anticipated.  [facility] [phone]  Other Instructions Follow-up with dermatology as instructed   If you have been instructed to have an in-person evaluation today at a local Urgent Care facility, please use the link below. It will take you to a list of all of our available [facility], including address,  phone number and hours of operation. Please do not delay care.  [facility]  If you or a family member do not have a primary care provider, use the link below to schedule a visit and establish care. When you choose a [facility] primary care physician or advanced practice provider, you gain a long-term partner in health. Find a Primary Care Provider  Learn more about [facility]'s in-office and virtual care options:  - Get Care Now  "

## 2024-10-20 NOTE — Progress Notes (Signed)
 " Virtual Visit Consent   [name], you are scheduled for a virtual visit with a [facility] provider today. Just as with appointments in the office, your consent must be obtained to participate. Your consent will be active for this visit and any virtual visit you may have with one of our providers in the next 365 days. If you have a MyChart account, a copy of this consent can be sent to you electronically.  As this is a virtual visit, video technology does not allow for your provider to perform a traditional examination. This may limit your provider's ability to fully assess your condition. If your provider identifies any concerns that need to be evaluated in person or the need to arrange testing (such as labs, EKG, etc.), we will make arrangements to do so. Although advances in technology are sophisticated, we cannot ensure that it will always work on either your end or our end. If the connection with a video visit is poor, the visit may have to be switched to a telephone visit. With either a video or telephone visit, we are not always able to ensure that we have a secure connection.  By engaging in this virtual visit, you consent to the provision of healthcare and authorize for your insurance to be billed (if applicable) for the services provided during this visit. Depending on your insurance coverage, you may receive a charge related to this service.  I need to obtain your verbal consent now. Are you willing to proceed with your visit today? [name] has provided verbal consent on 10/20/2024 for a virtual visit (video or telephone). [name], NP  Date: 10/20/2024 10:27 AM   Virtual Visit via Video Note   I, [name], connected with  [name]  ([id number], [birth year]) on 10/20/2024 at 10:00 AM EST by a video-enabled telemedicine application and verified that I am speaking with the correct person using two identifiers.  Location: Patient: Virtual Visit Location Patient:  Home Provider: Virtual Visit Location Provider: Home Office   I discussed the limitations of evaluation and management by telemedicine and the availability of in person appointments. The patient expressed understanding and agreed to proceed.    History of Present Illness: [name] is a 59 y.o. who identifies as a female who was assigned female at birth, and is being seen today for dermatitis of the skin.  [name] is being seen today for bilateral periorbital erythema, swelling and pruritus of the skin. She has not used any new cosmetics, soaps or detergents. These symptoms have occurred in the past and she was treated with topical steroids as well as oral steroids. There are no vision changes and she is not currently experiencing any severe URI symptoms. She has not started taking any new medications. She does have a dermatologist but the dermatitis around her eyes has not been specifically discussed or diagnosed.   Problems:  Patient Active Problem List   Diagnosis Date Noted   Chest pain 04/22/2019   NSTEMI (non-ST elevated myocardial infarction) (HCC) 04/22/2019   Elevated troponin    S/P CABG x 2 04/08/2019   Morbid obesity (HCC) 12/18/2018   Ischemic cardiomyopathy -in setting of anterior non-STEMI 12/17/2018   Acute combined systolic and diastolic heart failure (HCC) 12/17/2018   Acute non-ST elevation myocardial infarction (NSTEMI) (HCC) 12/16/2018   CAD S/P DES PCI -pLAD 12/16/2018   Hyperlipidemia with target LDL less than 70 12/16/2018   Traumatic fracture of spine  at T12-L1 level, sequela 03/27/2014   Encounter for IUD removal 05/16/2013    Class: Status post   S/P tubal ligation 05/16/2013    Class: Status post   IRREGULAR MENSES 09/29/2009    Allergies: Allergies[1] Medications: Current Medications[2]  Observations/Objective: Patient is well-developed, well-nourished in no acute distress.  Resting comfortably at home.  Head is normocephalic, atraumatic.  No  labored breathing. Speech is clear and coherent with logical content.  Patient is alert and oriented at baseline.  The skin above both eyes is erythematous and very dry.  Assessment and Plan: 1. Dermatitis (Primary) - predniSONE  (DELTASONE ) 20 MG tablet; Take 2 tablets (40 mg total) by mouth daily with breakfast for 7 days.  Dispense: 14 tablet; Refill: 0 - triamcinolone  cream (KENALOG ) 0.1 %; Apply topically as needed.  Dispense: 30 g; Refill: 0   Follow Up Instructions: I discussed the assessment and treatment plan with the patient. The patient was provided an opportunity to ask questions and all were answered. The patient agreed with the plan and demonstrated an understanding of the instructions.  A copy of instructions were sent to the patient via MyChart unless otherwise noted below.    The patient was advised to call back or seek an in-person evaluation if the symptoms worsen or if the condition fails to improve as anticipated.    [name], NP     [1]  Allergies Allergen Reactions   Tramadol      Made her feel very strange and groggy   Tetracycline Rash  [2]  Current Outpatient Medications:    albuterol  (VENTOLIN  HFA) 108 (90 Base) MCG/ACT inhaler, INHALE 2 PUFFS INTO THE LUNGS EVERY 4 HOURS AS NEEDED., Disp: 8.5 g, Rfl: 1   aspirin  81 MG chewable tablet, Chew 1 tablet (81 mg total) by mouth daily., Disp: 30 tablet, Rfl: 0   atorvastatin  (LIPITOR ) 80 MG tablet, Take 1 tablet (80 mg total) by mouth daily at 6 PM., Disp: 90 tablet, Rfl: 0   betamethasone  dipropionate 0.05 % cream, Apply as directed to affected area 2 (two) times daily, taper use as able, Disp: 45 g, Rfl: 3   Famotidine  (PEPCID  AC PO), Take 20 mg by mouth as needed., Disp: , Rfl:    loratadine  (CLARITIN ) 10 MG tablet, Take 10 mg by mouth daily as needed for allergies., Disp: , Rfl:    metoprolol  tartrate (LOPRESSOR ) 25 MG tablet, Take 1 tablet (25 mg total) by mouth 2 (two) times daily., Disp: 180 tablet,  Rfl: 0   nitroGLYCERIN  (NITROSTAT ) 0.4 MG SL tablet, Place 1 tablet (0.4 mg total) under the tongue every 5 (five) minutes as needed for chest pain., Disp: 25 tablet, Rfl: 5   predniSONE  (DELTASONE ) 20 MG tablet, Take 2 tablets (40 mg total) by mouth daily with breakfast for 7 days., Disp: 14 tablet, Rfl: 0   triamcinolone  cream (KENALOG ) 0.1 %, Apply topically as needed., Disp: 30 g, Rfl: 0  "

## 2024-10-30 NOTE — Progress Notes (Signed)
 "   Chief Complaint  Patient presents with   Follow-up    CAD   History of Present Illness: 59 yo female with history of CAD s/p 2V CABG, GERD, HLD and HTN here today for follow up. She had been followed by Dr. Claudene and then by Dr. Dann. She had stenting of the LAD in March 2020 and then had 2V CABG in June 2020 (LIMA to LAD, SVG to Diagonal). Echo July 2023 with LVEF=60-65%. No valve disease.   She is here today for follow up. She is frustrated with her weight. She has dyspnea with exertion, especially when carrying heavy objects. She has no exertional chest pain suggestive of angina. She denies palpitations, lower extremity edema, orthopnea, PND, dizziness, near syncope or syncope.   Primary Care Physician: Valentin Skates, DO   Past Medical History:  Diagnosis Date   Acute non-ST elevation myocardial infarction (NSTEMI) (HCC) 12/16/2018   Allergy    Atherosclerosis    Blood transfusion without reported diagnosis    CAD S/P DES PCI -pLAD 12/16/2018   Cath 12/15/2017: Successful proximal LAD synergy 3.0 x 15 DES postdilated to 3.25 mm reducing the stenosis to 0% ; ? 60-70% ostial LAD noted on some images.   Elevated alkaline phosphatase level    GERD (gastroesophageal reflux disease)    Hepatic steatosis    Hyperlipidemia    Hyperplastic colon polyp    Hypertension    Pericarditis as complication of acute myocardial infarction (HCC)    PONV (postoperative nausea and vomiting)     Past Surgical History:  Procedure Laterality Date   CARDIAC CATHETERIZATION     CESAREAN SECTION     CORONARY ARTERY BYPASS GRAFT N/A 04/08/2019   Procedure: CORONARY ARTERY BYPASS GRAFTING (CABG) x two , using left internal mammary artery and right leg greater saphenous vein harvested endoscopically;  Surgeon: Fleeta Hanford Coy, MD;  Location: [facility] OR;  Service: Open Heart Surgery;  Laterality: N/A;   CORONARY/GRAFT ACUTE MI REVASCULARIZATION N/A 12/16/2018   Procedure: Coronary/Graft Acute MI  Revascularization;  Surgeon: Claudene Victory ORN, MD;  Location: [facility] INVASIVE CV LAB;  Service: Cardiovascular;  Laterality: N/A;   IUD REMOVAL N/A 05/16/2013   Procedure: INTRAUTERINE DEVICE (IUD) REMOVAL;  Surgeon: Norleen GORMAN Skill, MD;  Location: [facility] ORS;  Service: Gynecology;  Laterality: N/A;   LAPAROSCOPIC TUBAL LIGATION Bilateral 05/16/2013   Procedure: LAPAROSCOPIC TUBAL LIGATION;  Surgeon: Norleen GORMAN Skill, MD;  Location: [facility] ORS;  Service: Gynecology;  Laterality: Bilateral;   LEFT HEART CATH AND CORONARY ANGIOGRAPHY N/A 12/16/2018   Procedure: LEFT HEART CATH AND CORONARY ANGIOGRAPHY;  Surgeon: Claudene Victory ORN, MD;  Location: [facility] LAB;  Service: Cardiovascular;  Laterality: N/A;   LEFT HEART CATH AND CORONARY ANGIOGRAPHY N/A 03/26/2019   Procedure: LEFT HEART CATH AND CORONARY ANGIOGRAPHY;  Surgeon: Claudene Victory ORN, MD;  Location: [facility];  Service: Cardiovascular;  Laterality: N/A;   TEE WITHOUT CARDIOVERSION N/A 04/08/2019   Procedure: TRANSESOPHAGEAL ECHOCARDIOGRAM (TEE);  Surgeon: Fleeta Hanford, Coy, MD;  Location: [facility] OR;  Service: Open Heart Surgery;  Laterality: N/A;   TUBAL LIGATION      Current Outpatient Medications  Medication Sig Dispense Refill   albuterol  (VENTOLIN  HFA) 108 (90 Base) MCG/ACT inhaler INHALE 2 PUFFS INTO THE LUNGS EVERY 4 HOURS AS NEEDED. 8.5 g 1   aspirin  81 MG chewable tablet Chew 1 tablet (81 mg total) by mouth daily. 30 tablet 0   atorvastatin  (LIPITOR ) 80 MG tablet Take  1 tablet (80 mg total) by mouth daily at 6 PM. 90 tablet 0   betamethasone  dipropionate 0.05 % cream Apply as directed to affected area 2 (two) times daily, taper use as able 45 g 3   Famotidine  (PEPCID  AC PO) Take 20 mg by mouth as needed.     loratadine  (CLARITIN ) 10 MG tablet Take 10 mg by mouth daily as needed for allergies.     metoprolol  tartrate (LOPRESSOR ) 25 MG tablet Take 1 tablet (25 mg total) by mouth 2 (two) times daily. 180 tablet 0   nitroGLYCERIN  (NITROSTAT ) 0.4 MG SL  tablet Place 1 tablet (0.4 mg total) under the tongue every 5 (five) minutes as needed for chest pain. 25 tablet 5   triamcinolone  cream (KENALOG ) 0.1 % Apply topically as needed. 30 g 0   No current facility-administered medications for this visit.    Allergies[1]  Social History   Socioeconomic History   Marital status: Married    Spouse name: Not on file   Number of children: Not on file   Years of education: Not on file   Highest education level: Bachelor's degree (e.g., BA, AB, BS)  Occupational History   Occupation: BSN Nursing  Tobacco Use   Smoking status: Never   Smokeless tobacco: Never  Vaping Use   Vaping status: Never Used  Substance and Sexual Activity   Alcohol  use: Yes    Comment:  once a month   Drug use: No   Sexual activity: Yes  Other Topics Concern   Not on file  Social History Narrative   Not on file   Social Drivers of Health   Tobacco Use: Low Risk (10/31/2024)   Patient History    Smoking Tobacco Use: Never    Smokeless Tobacco Use: Never    Passive Exposure: Not on file  Financial Resource Strain: Not on file  Food Insecurity: Not on file  Transportation Needs: Not on file  Physical Activity: Not on file  Stress: Not on file  Social Connections: Not on file  Intimate Partner Violence: Not on file  Depression (PHQ2-9): Not on file  Alcohol  Screen: Not on file  Housing: Not on file  Utilities: Not on file  Health Literacy: Not on file    Family History  Problem Relation Age of Onset   High blood pressure Mother    Heart attack Mother        2 stents   CAD Mother    Hyperlipidemia Mother    Colon cancer Father 77   Hyperlipidemia Sister    Heart disease Brother    Heart failure Maternal Grandmother    Heart attack Maternal Grandfather        deceased 30   Cancer Paternal Grandfather    Heart disease Other    Heart attack Other    Esophageal cancer Neg Hx    Stomach cancer Neg Hx    Rectal cancer Neg Hx     Review of  Systems:  As stated in the HPI and otherwise negative.   BP 128/78   Pulse 77   Ht 4' 11 (1.499 m)   Wt 212 lb 3.2 oz (96.3 kg)   LMP 01/20/2021   SpO2 96%   BMI 42.86 kg/m   Physical Examination: General: Well developed, well nourished, NAD  HEENT: OP clear, mucus membranes moist  SKIN: warm, dry. No rashes. Neuro: No focal deficits  Musculoskeletal: Muscle strength 5/5 all ext  Psychiatric: Mood and affect normal  Neck:  No JVD, no carotid bruits, no thyromegaly, no lymphadenopathy.  Lungs:Clear bilaterally, no wheezes, rhonci, crackles Cardiovascular: Regular rate and rhythm. No murmurs, gallops or rubs. Abdomen:Soft. Bowel sounds present. Non-tender.  Extremities: No lower extremity edema. Pulses are 2 + in the bilateral DP/PT.  EKG:  EKG is ordered today. The ekg ordered today demonstrates  EKG Interpretation Date/Time:  Thursday October 31 2024 10:49:38 EST Ventricular Rate:  59 PR Interval:  126 QRS Duration:  86 QT Interval:  398 QTC Calculation: 394 R Axis:   39  Text Interpretation: Sinus bradycardia When compared with ECG of 12-Jun-2023 11:43, No significant change was found Confirmed by Verlin Bruckner [id number]) on 10/31/2024 10:58:00 AM    Recent Labs: No results found for requested labs within last 365 days.   Lipid Panel    Component Value Date/Time   CHOL 99 (L) 06/02/2020 0758   TRIG 89 06/02/2020 0758   HDL 39 (L) 06/02/2020 0758   CHOLHDL 2.5 06/02/2020 0758   CHOLHDL 4.4 12/16/2018 0629   VLDL 15 12/16/2018 0629   LDLCALC 42 06/02/2020 0758    Wt Readings from Last 3 Encounters:  10/31/24 212 lb 3.2 oz (96.3 kg)  06/12/23 210 lb (95.3 kg)  06/06/22 207 lb (93.9 kg)    Assessment and Plan:   1. CAD s/p CABG without angina: No chest pain. LV function normal by echo in July 2023.  -Continue ASA, Lipitor  and Lopressor   2. HTN: BP is well controlled.  -Continue Lopressor   3. HLD: LDL 39 in November 2025.  -Continue Lipitor   4.  Obesity: We discussed diet and weight loss strategies. She would like to try to lose weight by changing her diet and increasing exercise. She will consider Ozempic and will discuss with primary care.   Labs/ tests ordered today include:  Orders Placed This Encounter  Procedures   EKG 12-Lead   Disposition:   F/U with me in one year   Signed, Bruckner Verlin, MD, [facility] 10/31/2024 11:30 AM    [facility] [address] [place], [place], [place]  [address] Phone: [phone]; Fax: [phone]       [1]  Allergies Allergen Reactions   Tramadol      Made her feel very strange and groggy   Tetracycline Rash   "

## 2024-10-31 ENCOUNTER — Encounter: Payer: Self-pay | Admitting: Cardiovascular Disease

## 2024-10-31 ENCOUNTER — Ambulatory Visit: Attending: Cardiovascular Disease | Admitting: Cardiovascular Disease

## 2024-10-31 VITALS — BP 128/78 | HR 77 | Ht 59.0 in | Wt 212.2 lb

## 2024-10-31 DIAGNOSIS — I10 Essential (primary) hypertension: Secondary | ICD-10-CM | POA: Diagnosis not present

## 2024-10-31 DIAGNOSIS — E785 Hyperlipidemia, unspecified: Secondary | ICD-10-CM | POA: Diagnosis not present

## 2024-10-31 DIAGNOSIS — I2510 Atherosclerotic heart disease of native coronary artery without angina pectoris: Secondary | ICD-10-CM | POA: Diagnosis not present

## 2024-10-31 NOTE — Patient Instructions (Signed)
